# Patient Record
Sex: Female | Born: 1990
Health system: Southern US, Community
[De-identification: ages and names within clinical notes are randomized; demographics above are authoritative.]

## PROBLEM LIST (undated history)

## (undated) DIAGNOSIS — I719 Aortic aneurysm of unspecified site, without rupture: Secondary | ICD-10-CM

## (undated) DIAGNOSIS — G43909 Migraine, unspecified, not intractable, without status migrainosus: Secondary | ICD-10-CM

## (undated) DIAGNOSIS — R011 Cardiac murmur, unspecified: Secondary | ICD-10-CM

## (undated) DIAGNOSIS — I350 Nonrheumatic aortic (valve) stenosis: Secondary | ICD-10-CM

## (undated) DIAGNOSIS — L209 Atopic dermatitis, unspecified: Secondary | ICD-10-CM

## (undated) DIAGNOSIS — I519 Heart disease, unspecified: Secondary | ICD-10-CM

## (undated) DIAGNOSIS — T7840XA Allergy, unspecified, initial encounter: Secondary | ICD-10-CM

## (undated) HISTORY — DX: Nonrheumatic aortic (valve) stenosis: I35.0

## (undated) HISTORY — DX: Cardiac murmur, unspecified: R01.1

## (undated) HISTORY — DX: Heart disease, unspecified: I51.9

## (undated) HISTORY — DX: Aortic aneurysm of unspecified site, without rupture: I71.9

## (undated) HISTORY — PX: ROSS KONNO PROCEDURE: SHX2364

## (undated) HISTORY — DX: Allergy, unspecified, initial encounter: T78.40XA

## (undated) HISTORY — DX: Migraine, unspecified, not intractable, without status migrainosus: G43.909

## (undated) HISTORY — PX: WISDOM TOOTH EXTRACTION: SHX21

## (undated) HISTORY — DX: Atopic dermatitis, unspecified: L20.9

## (undated) HISTORY — PX: CARDIAC VALVE REPLACEMENT: SHX585

---
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
## 2015-12-29 DIAGNOSIS — K219 Gastro-esophageal reflux disease without esophagitis: Secondary | ICD-10-CM

## 2015-12-29 HISTORY — DX: Gastro-esophageal reflux disease without esophagitis: K21.9

## 2016-07-25 DIAGNOSIS — I83891 Varicose veins of right lower extremities with other complications: Secondary | ICD-10-CM | POA: Insufficient documentation

## 2016-08-25 DIAGNOSIS — Z954 Presence of other heart-valve replacement: Secondary | ICD-10-CM | POA: Insufficient documentation

## 2016-12-09 LAB — HM PAP SMEAR

## 2017-02-24 DIAGNOSIS — B001 Herpesviral vesicular dermatitis: Secondary | ICD-10-CM | POA: Diagnosis not present

## 2017-02-24 DIAGNOSIS — I359 Nonrheumatic aortic valve disorder, unspecified: Secondary | ICD-10-CM | POA: Diagnosis not present

## 2017-02-24 DIAGNOSIS — I340 Nonrheumatic mitral (valve) insufficiency: Secondary | ICD-10-CM | POA: Diagnosis not present

## 2017-02-24 DIAGNOSIS — G43109 Migraine with aura, not intractable, without status migrainosus: Secondary | ICD-10-CM | POA: Diagnosis not present

## 2017-03-28 DIAGNOSIS — Z954 Presence of other heart-valve replacement: Secondary | ICD-10-CM | POA: Diagnosis not present

## 2017-03-28 DIAGNOSIS — I77810 Thoracic aortic ectasia: Secondary | ICD-10-CM | POA: Diagnosis not present

## 2017-03-28 DIAGNOSIS — I83891 Varicose veins of right lower extremities with other complications: Secondary | ICD-10-CM | POA: Diagnosis not present

## 2017-03-28 DIAGNOSIS — R55 Syncope and collapse: Secondary | ICD-10-CM | POA: Diagnosis not present

## 2017-03-31 DIAGNOSIS — I340 Nonrheumatic mitral (valve) insufficiency: Secondary | ICD-10-CM | POA: Insufficient documentation

## 2017-03-31 DIAGNOSIS — I341 Nonrheumatic mitral (valve) prolapse: Secondary | ICD-10-CM | POA: Insufficient documentation

## 2017-07-11 DIAGNOSIS — N926 Irregular menstruation, unspecified: Secondary | ICD-10-CM | POA: Diagnosis not present

## 2017-07-11 DIAGNOSIS — N888 Other specified noninflammatory disorders of cervix uteri: Secondary | ICD-10-CM | POA: Diagnosis not present

## 2017-08-28 DIAGNOSIS — J309 Allergic rhinitis, unspecified: Secondary | ICD-10-CM | POA: Diagnosis not present

## 2017-08-28 DIAGNOSIS — B379 Candidiasis, unspecified: Secondary | ICD-10-CM | POA: Diagnosis not present

## 2017-08-28 DIAGNOSIS — Z6822 Body mass index (BMI) 22.0-22.9, adult: Secondary | ICD-10-CM | POA: Diagnosis not present

## 2017-08-28 DIAGNOSIS — M79631 Pain in right forearm: Secondary | ICD-10-CM | POA: Diagnosis not present

## 2017-09-08 DIAGNOSIS — R509 Fever, unspecified: Secondary | ICD-10-CM | POA: Diagnosis not present

## 2017-10-06 DIAGNOSIS — B002 Herpesviral gingivostomatitis and pharyngotonsillitis: Secondary | ICD-10-CM | POA: Diagnosis not present

## 2017-10-06 DIAGNOSIS — I340 Nonrheumatic mitral (valve) insufficiency: Secondary | ICD-10-CM | POA: Diagnosis not present

## 2017-10-06 DIAGNOSIS — Z23 Encounter for immunization: Secondary | ICD-10-CM | POA: Diagnosis not present

## 2017-10-06 DIAGNOSIS — G43109 Migraine with aura, not intractable, without status migrainosus: Secondary | ICD-10-CM | POA: Diagnosis not present

## 2017-10-06 DIAGNOSIS — Z0001 Encounter for general adult medical examination with abnormal findings: Secondary | ICD-10-CM | POA: Diagnosis not present

## 2017-11-07 DIAGNOSIS — Z954 Presence of other heart-valve replacement: Secondary | ICD-10-CM | POA: Diagnosis not present

## 2017-11-07 DIAGNOSIS — I371 Nonrheumatic pulmonary valve insufficiency: Secondary | ICD-10-CM | POA: Diagnosis not present

## 2017-11-07 DIAGNOSIS — Z8774 Personal history of (corrected) congenital malformations of heart and circulatory system: Secondary | ICD-10-CM | POA: Diagnosis not present

## 2017-11-07 DIAGNOSIS — I77810 Thoracic aortic ectasia: Secondary | ICD-10-CM | POA: Diagnosis not present

## 2017-11-07 DIAGNOSIS — Q230 Congenital stenosis of aortic valve: Secondary | ICD-10-CM | POA: Diagnosis not present

## 2017-11-07 DIAGNOSIS — I083 Combined rheumatic disorders of mitral, aortic and tricuspid valves: Secondary | ICD-10-CM | POA: Diagnosis not present

## 2017-11-07 DIAGNOSIS — R55 Syncope and collapse: Secondary | ICD-10-CM | POA: Diagnosis not present

## 2017-11-07 DIAGNOSIS — I83891 Varicose veins of right lower extremities with other complications: Secondary | ICD-10-CM | POA: Diagnosis not present

## 2018-02-10 DIAGNOSIS — I77810 Thoracic aortic ectasia: Secondary | ICD-10-CM | POA: Diagnosis not present

## 2018-07-07 ENCOUNTER — Encounter: Payer: Self-pay | Admitting: Family Medicine

## 2018-07-07 ENCOUNTER — Other Ambulatory Visit: Payer: Self-pay

## 2018-07-07 ENCOUNTER — Ambulatory Visit: Payer: BLUE CROSS/BLUE SHIELD | Admitting: Family Medicine

## 2018-07-07 VITALS — BP 110/70 | HR 65 | Temp 98.9°F | Ht 61.0 in | Wt 115.2 lb

## 2018-07-07 DIAGNOSIS — I77810 Thoracic aortic ectasia: Secondary | ICD-10-CM

## 2018-07-07 DIAGNOSIS — I83891 Varicose veins of right lower extremities with other complications: Secondary | ICD-10-CM | POA: Diagnosis not present

## 2018-07-07 DIAGNOSIS — I872 Venous insufficiency (chronic) (peripheral): Secondary | ICD-10-CM | POA: Diagnosis not present

## 2018-07-07 DIAGNOSIS — Q230 Congenital stenosis of aortic valve: Secondary | ICD-10-CM | POA: Diagnosis not present

## 2018-07-07 NOTE — Progress Notes (Signed)
Subjective  CC:  Chief Complaint  Patient presents with  . Establish Care    [facility] Dr. Olivia CanterWilliam Kelly, Last Physical October 2018  . Skin Problem    Patient states she has a place on her upper leg, she states that she thinks its a Varicose Vein     HPI: Cathy RoverSarah Vasquez is a 27 y.o. female who presents to [facility] today to establish care with me as a new patient.  Former pt of Dr. Olivia CanterWilliam Kelly [facility]. His notes are not available. Did review [facility] and OB notes/records.  She has the following concerns or needs:   Very pleasant 27 yo G2P2002 with h/o congenital aortic stenosis and now aortic root dilitation, s/p several procedures that are well documented in her chart. Overall, doing well in that regard. Taking 1/2 dose metoprolol preventively.   Has chronic varicosities on right leg, worse after pregnancy; reports h/o problems with right leg after a procedure done as a new born. She admits to intermitten leg swelling on right responsive to elevation and compression hose. Worse with menses. Does get painful varicosities at time. No h/o DVT.   HM: up to date; last cpe oct 2018.  Assessment  1. Aortic root dilatation (HCC) Chronic  2. Venous insufficiency (chronic) (peripheral)   3. Varicose veins of right leg with edema   4. Congenital aortic stenosis      Plan   Cards: stable and managed by cards.   Counseled/educated on varicosities and chronic venous insufficiency. rec vein clinic reassessment due to pain and progression. rec hydration, elevation and compression stockings as needed. Reassured.   Follow up:  Return in about 3 months (around 10/07/2018) for complete physical. Orders Placed This Encounter  Procedures  . HM PAP SMEAR  . Ambulatory referral to Vascular Surgery   No orders of the defined types were placed in this encounter.    Depression screen PHQ 2/9 07/07/2018    Decreased Interest 0  Down, Depressed, Hopeless 0  PHQ - 2 Score 0    We updated and reviewed the patient's past history in detail and it is documented below.  Patient Active Problem List   Diagnosis Date Noted  . Venous insufficiency (chronic) (peripheral) 07/07/2018  . Congenital aortic stenosis 11/07/2017  . Non-rheumatic mitral regurgitation 03/31/2017  . Aortic root dilatation (HCC) 03/28/2017  . H/O Ross procedure 08/25/2016  . Varicose veins of right leg with edema 07/25/2016    Cardiology's note venous insufficiency not cardiac related   . Gastroesophageal reflux disease without esophagitis 12/29/2015    - discussed lifestyle changes - prescribed ranitidine 12/29/15 Rx Zantac 150 mg # 60 BID with refills x 5 given(01/19/16)    Health Maintenance  Topic Date Due  . INFLUENZA VACCINE  07/09/2018  . PAP SMEAR  12/10/2019  . TETANUS/TDAP  05/10/2026  . HIV Screening  Completed   Immunization History  Administered Date(s) Administered  . Influenza-Unspecified 11/20/2012, 09/08/2015  . Tdap 05/10/2016   Current Meds  Medication Sig  . metoprolol succinate (TOPROL-XL) 25 MG 24 hr tablet Take 12.5 mg by mouth daily.  Marland Kitchen. triamcinolone ointment (KENALOG) 0.1 % 1 APP ONCE A DAY AT BEDTIME APPLIED TOPICALLY    Allergies: Patient is allergic to other; amoxicillin; cefaclor; and penicillin g. Past Medical History Patient  has a past medical history of Aortic aneurysm [facility](HCC), Aortic stenosis, Heart disease, Heart murmur, and Migraines. Past Surgical History  Patient  has a past surgical history that includes Cardiac surgery (05/17/2003); Wisdom tooth extraction; and Bridgette Habermann procedure. Family History: Patient family history includes Diabetes in her paternal grandfather; Healthy in her daughter, sister, sister, and son; Heart disease in her maternal grandmother; Hypertension in her father and mother; Migraines in her maternal aunt. Social History:  Patient  reports that she  has never smoked. She has never used smokeless tobacco. She reports that she drank alcohol. She reports that she has current or past drug history.  Review of Systems: Constitutional: negative for fever or malaise Ophthalmic: negative for photophobia, double vision or loss of vision Cardiovascular: negative for chest pain, dyspnea on exertion, or new LE swelling Respiratory: negative for SOB or persistent cough Gastrointestinal: negative for abdominal pain, change in bowel habits or melena Genitourinary: negative for dysuria or gross hematuria Musculoskeletal: negative for new gait disturbance or muscular weakness Integumentary: negative for new or persistent rashes Neurological: negative for TIA or stroke symptoms Psychiatric: negative for SI or delusions Allergic/Immunologic: negative for hives  Patient Care Team    Relationship Specialty Notifications Start End  Willow Ora, MD PCP - General Family Medicine  07/07/18     Objective  Vitals: BP 110/70   Pulse 65   Temp 98.9 F (37.2 C)   Ht 5\' 1"  (1.549 m)   Wt 115 lb 3.2 oz (52.3 kg)   SpO2 99%   BMI 21.77 kg/m  General:  Well developed, well nourished, no acute distress  Psych:  Alert and oriented,normal mood and affect HEENT:  Normocephalic, atraumatic, non-icteric sclera, PERRL, oropharynx is without mass or exudate, supple neck without adenopathy, mass or thyromegaly Cardiovascular:  RRR without gallop, rub + systolic murmur, nondisplaced PMI Respiratory:  Good breath sounds bilaterally, CTAB with normal respiratory effort MSK: no deformities, contusions. Joints are without erythema or swelling Right le: multiple large nontender varicosities in lower and upper leg/perineum. No warmth or thrombosis or erythema. No peripheral edema now. Skin:  Warm, no rashes or suspicious lesions noted Neurologic:    Mental status is normal. Gross motor and sensory exams are normal. Normal gait   Commons side effects, risks, benefits,  and alternatives for medications and treatment plan prescribed today were discussed, and the patient expressed understanding of the given instructions. Patient is instructed to call or message via MyChart if he/she has any questions or concerns regarding our treatment plan. No barriers to understanding were identified. We discussed Red Flag symptoms and signs in detail. Patient expressed understanding regarding what to do in case of urgent or emergency type symptoms.   Medication list was reconciled, printed and provided to the patient in AVS. Patient instructions and summary information was reviewed with the patient as documented in the AVS. This note was prepared with assistance of Dragon voice recognition software. Occasional wrong-word or sound-a-like substitutions may have occurred due to the inherent limitations of voice recognition software

## 2018-07-07 NOTE — Patient Instructions (Signed)
Please return in October for your annual complete physical; please come fasting.   If you have any questions or concerns, please don't hesitate to send me a message via MyChart or call the office at [phone]. Thank you for visiting with Cathy Vasquez today! It's our pleasure caring for you.  We will call you with information regarding your referral appointment. [facility].  If you do not hear from Cathy Vasquez within the next 2 weeks, please let me know. It can take 1-2 weeks to get appointments set up with the specialists.

## 2018-08-03 DIAGNOSIS — I83891 Varicose veins of right lower extremities with other complications: Secondary | ICD-10-CM | POA: Diagnosis not present

## 2018-08-03 DIAGNOSIS — M79604 Pain in right leg: Secondary | ICD-10-CM | POA: Diagnosis not present

## 2018-08-05 DIAGNOSIS — I83811 Varicose veins of right lower extremities with pain: Secondary | ICD-10-CM | POA: Diagnosis not present

## 2018-08-05 DIAGNOSIS — I8311 Varicose veins of right lower extremity with inflammation: Secondary | ICD-10-CM | POA: Diagnosis not present

## 2018-10-15 ENCOUNTER — Telehealth: Payer: Self-pay | Admitting: Family Medicine

## 2018-10-15 ENCOUNTER — Ambulatory Visit (INDEPENDENT_AMBULATORY_CARE_PROVIDER_SITE_OTHER): Payer: BLUE CROSS/BLUE SHIELD | Admitting: Family Medicine

## 2018-10-15 ENCOUNTER — Other Ambulatory Visit: Payer: Self-pay

## 2018-10-15 ENCOUNTER — Encounter: Payer: Self-pay | Admitting: Family Medicine

## 2018-10-15 VITALS — BP 92/64 | HR 65 | Temp 98.0°F | Ht 61.0 in | Wt 112.0 lb

## 2018-10-15 DIAGNOSIS — Z23 Encounter for immunization: Secondary | ICD-10-CM

## 2018-10-15 DIAGNOSIS — L301 Dyshidrosis [pompholyx]: Secondary | ICD-10-CM | POA: Insufficient documentation

## 2018-10-15 DIAGNOSIS — I340 Nonrheumatic mitral (valve) insufficiency: Secondary | ICD-10-CM

## 2018-10-15 DIAGNOSIS — I83891 Varicose veins of right lower extremities with other complications: Secondary | ICD-10-CM

## 2018-10-15 DIAGNOSIS — Z0001 Encounter for general adult medical examination with abnormal findings: Secondary | ICD-10-CM

## 2018-10-15 DIAGNOSIS — I872 Venous insufficiency (chronic) (peripheral): Secondary | ICD-10-CM

## 2018-10-15 DIAGNOSIS — L209 Atopic dermatitis, unspecified: Secondary | ICD-10-CM | POA: Diagnosis not present

## 2018-10-15 HISTORY — DX: Atopic dermatitis, unspecified: L20.9

## 2018-10-15 LAB — CBC WITH DIFFERENTIAL/PLATELET
BASOS ABS: 0 10*3/uL (ref 0.0–0.1)
BASOS PCT: 0.7 % (ref 0.0–3.0)
Eosinophils Absolute: 0.2 10*3/uL (ref 0.0–0.7)
Eosinophils Relative: 2.5 % (ref 0.0–5.0)
HEMATOCRIT: 43.5 % (ref 36.0–46.0)
Hemoglobin: 14.8 g/dL (ref 12.0–15.0)
LYMPHS ABS: 2 10*3/uL (ref 0.7–4.0)
LYMPHS PCT: 32.1 % (ref 12.0–46.0)
MCHC: 34.1 g/dL (ref 30.0–36.0)
MCV: 93.5 fl (ref 78.0–100.0)
MONO ABS: 0.5 10*3/uL (ref 0.1–1.0)
Monocytes Relative: 7.7 % (ref 3.0–12.0)
NEUTROS ABS: 3.5 10*3/uL (ref 1.4–7.7)
NEUTROS PCT: 57 % (ref 43.0–77.0)
PLATELETS: 212 10*3/uL (ref 150.0–400.0)
RBC: 4.65 Mil/uL (ref 3.87–5.11)
RDW: 13.1 % (ref 11.5–15.5)
WBC: 6.1 10*3/uL (ref 4.0–10.5)

## 2018-10-15 LAB — LIPID PANEL
CHOL/HDL RATIO: 4
Cholesterol: 182 mg/dL (ref 0–200)
HDL: 49.6 mg/dL (ref >39.00)
LDL CALC: 118 mg/dL — AB (ref 0–99)
NONHDL: 132.23
TRIGLYCERIDES: 72 mg/dL (ref 0.0–149.0)
VLDL: 14.4 mg/dL (ref 0.0–40.0)

## 2018-10-15 LAB — COMPREHENSIVE METABOLIC PANEL
ALT: 12 U/L (ref 0–35)
AST: 16 U/L (ref 0–37)
Albumin: 4.7 g/dL (ref 3.5–5.2)
Alkaline Phosphatase: 26 U/L — ABNORMAL LOW (ref 39–117)
BUN: 12 mg/dL (ref 6–23)
CALCIUM: 9.6 mg/dL (ref 8.4–10.5)
CHLORIDE: 102 meq/L (ref 96–112)
CO2: 30 meq/L (ref 19–32)
Creatinine, Ser: 0.72 mg/dL (ref 0.40–1.20)
GFR: 102.82 mL/min (ref >60.00)
GLUCOSE: 78 mg/dL (ref 70–99)
Potassium: 4 mEq/L (ref 3.5–5.1)
SODIUM: 137 meq/L (ref 135–145)
Total Bilirubin: 0.5 mg/dL (ref 0.2–1.2)
Total Protein: 7.4 g/dL (ref 6.0–8.3)

## 2018-10-15 LAB — TSH: TSH: 6.82 u[IU]/mL — ABNORMAL HIGH (ref 0.35–4.50)

## 2018-10-15 MED ORDER — CLINDAMYCIN HCL 300 MG PO CAPS: ORAL_CAPSULE | ORAL | 3 refills | Status: DC

## 2018-10-15 NOTE — Telephone Encounter (Signed)
Copied from CRM [id number]. Topic: General - Other >> Oct 15, 2018 11:55 AM Cathy Vasquez wrote: Reason for CRM: Patient called to say that she have Vasquez dental appointment next Tuesday 10/20/18 at 3 pm and need Clindamycin Hcl because of her Heart Valve replacement. Please advise Ph # [phone]

## 2018-10-15 NOTE — Progress Notes (Signed)
Please add on free T4, and T3, dx: abnl TSH Thanks, Dr. Mardelle Matte '

## 2018-10-15 NOTE — Progress Notes (Signed)
Subjective  Chief Complaint  Patient presents with  . Annual Exam    doing well, no complaints, requests flu shot today     HPI: Cathy Vasquez is a 27 y.o. female who presents to [facility] at [facility] today for a Female Wellness Visit.   Wellness Visit: annual visit with health maintenance review and exam without Pap   Doing well. Vein clinic- recommend surgical repair of leaky valve in right groin.   Heart is stable: has f/u this month with cards  Dry skin: eczema, scratches, dry patches.   Assessment  1. Encounter for general adult medical examination with abnormal findings   2. Atopic dermatitis, unspecified type   3. Dyshidrotic eczema   4. Need for immunization against influenza   5. Venous insufficiency (chronic) (peripheral)   6. Varicose veins of right leg with edema   7. Non-rheumatic mitral regurgitation      Plan  Female Wellness Visit:  Age appropriate Health Maintenance and Prevention measures were discussed with patient. Included topics are cancer screening recommendations, ways to keep healthy (see AVS) including dietary and exercise recommendations, regular eye and dental care, use of seat belts, and avoidance of moderate alcohol use and tobacco use.   BMI: discussed patient's BMI and encouraged positive lifestyle modifications to help get to or maintain a target BMI.  HM needs and immunizations were addressed and ordered. See below for orders. See HM and immunization section for updates. Flu shot today  Routine labs and screening tests ordered including cmp, cbc and lipids where appropriate.  Discussed recommendations regarding Vit D and calcium supplementation (see AVS)  Discussed eczema skin care   Follow up: Return in 1 year (on 10/16/2019) for cpe, come fasting.   Orders Placed This Encounter  Procedures  . Flu Vaccine QUAD 36+ mos IM (Fluarix)  . Flu Vaccine QUAD 36+ mos IM  . CBC with Differential  . Comprehensive  metabolic panel  . Lipid panel  . TSH   No orders of the defined types were placed in this encounter.     Lifestyle: Body mass index is 21.16 kg/m. Wt Readings from Last 3 Encounters:  10/15/18 112 lb (50.8 kg)  07/07/18 115 lb 3.2 oz (52.3 kg)   Diet: low fat Exercise: frequently,  Need for contraception: No, vasectomy  Patient Active Problem List   Diagnosis Date Noted  . Atopic eczema 10/15/2018  . Dyshidrotic eczema 10/15/2018  . Venous insufficiency (chronic) (peripheral) 07/07/2018  . Congenital aortic stenosis 11/07/2017  . Non-rheumatic mitral regurgitation 03/31/2017  . Aortic root dilatation (HCC) 03/28/2017  . H/O Ross procedure 08/25/2016  . Varicose veins of right leg with edema 07/25/2016    Cardiology's note venous insufficiency not cardiac related   . Gastroesophageal reflux disease without esophagitis 12/29/2015    - discussed lifestyle changes - prescribed ranitidine 12/29/15 Rx Zantac 150 mg # 60 BID with refills x 5 given(01/19/16)    Health Maintenance  Topic Date Due  . INFLUENZA VACCINE  07/09/2018  . PAP SMEAR  12/10/2019  . TETANUS/TDAP  05/10/2026  . HIV Screening  Completed   Immunization History  Administered Date(s) Administered  . Influenza,inj,Quad PF,6+ Mos 10/15/2018  . Influenza-Unspecified 11/20/2012, 09/08/2015  . Tdap 05/10/2016   We updated and reviewed the patient's past history in detail and it is documented below. Allergies: Patient is allergic to other; amoxicillin; cefaclor; and penicillin g. Past Medical History Patient  has a past medical history of Aortic aneurysm [facility]), Aortic  stenosis, Atopic eczema (10/15/2018), Heart disease, Heart murmur, and Migraines. Past Surgical History Patient  has a past surgical history that includes Cardiac surgery (05/17/2003); Wisdom tooth extraction; and Bridgette Habermann procedure. Family History: Patient family history includes Diabetes in her paternal grandfather; Healthy in her  daughter, sister, sister, and son; Heart disease in her maternal grandmother; Hypertension in her father and mother; Migraines in her maternal aunt. Social History:  Patient  reports that she has never smoked. She has never used smokeless tobacco. She reports that she drank alcohol. She reports that she has current or past drug history.  Review of Systems: Constitutional: negative for fever or malaise Ophthalmic: negative for photophobia, double vision or loss of vision Cardiovascular: negative for chest pain, dyspnea on exertion, or new LE swelling Respiratory: negative for SOB or persistent cough Gastrointestinal: negative for abdominal pain, change in bowel habits or melena Genitourinary: negative for dysuria or gross hematuria, no abnormal uterine bleeding or disharge Musculoskeletal: negative for new gait disturbance or muscular weakness Integumentary: negative for new or persistent rashes, no breast lumps Neurological: negative for TIA or stroke symptoms Psychiatric: negative for SI or delusions Allergic/Immunologic: negative for hives Patient Care Team    Relationship Specialty Notifications Start End  Willow Ora, MD PCP - General Family Medicine  07/07/18     Objective  Vitals: BP 92/64   Pulse 65   Temp 98 F (36.7 C)   Ht 5\' 1"  (1.549 m)   Wt 112 lb (50.8 kg)   LMP 09/23/2018   SpO2 100%   BMI 21.16 kg/m  General:  Well developed, well nourished, no acute distress  Psych:  Alert and orientedx3,normal mood and affect HEENT:  Normocephalic, atraumatic, non-icteric sclera, PERRL, oropharynx is clear without mass or exudate, supple neck without adenopathy, mass or thyromegaly Cardiovascular:  Normal S1, S2, RRR without gallop, + systolic murmur at base,  Chest wall changes present Respiratory:  Good breath sounds bilaterally, CTAB with normal respiratory effort Gastrointestinal: normal bowel sounds, soft, non-tender, no noted masses. No HSM MSK: no deformities,  contusions. Joints are without erythema or swelling. Spine and CVA region are nontender Skin:  Warm, no rashes or suspicious lesions noted Neurologic:    Mental status is normal. CN 2-11 are normal. Gross motor and sensory exams are normal. Normal gait. No tremor Breast Exam: No mass, skin retraction or nipple discharge is appreciated in either breast. No axillary adenopathy. Fibrocystic changes are not noted    Commons side effects, risks, benefits, and alternatives for medications and treatment plan prescribed today were discussed, and the patient expressed understanding of the given instructions. Patient is instructed to call or message via MyChart if he/she has any questions or concerns regarding our treatment plan. No barriers to understanding were identified. We discussed Red Flag symptoms and signs in detail. Patient expressed understanding regarding what to do in case of urgent or emergency type symptoms.   Medication list was reconciled, printed and provided to the patient in AVS. Patient instructions and summary information was reviewed with the patient as documented in the AVS. This note was prepared with assistance of Dragon voice recognition software. Occasional wrong-word or sound-a-like substitutions may have occurred due to the inherent limitations of voice recognition software

## 2018-10-15 NOTE — Patient Instructions (Addendum)
Please return in 12 months for your annual complete physical; please come fasting.  Use the steroid ointment on your dry red patches as needed and don't scratch. vaseline is helpful as well.   If you have any questions or concerns, please don't hesitate to send me a message via MyChart or call the office at [phone]. Thank you for visiting with [place] today! It's our pleasure caring for you.   Health Maintenance, Female Adopting a healthy lifestyle and getting preventive care can go a long way to promote health and wellness. Talk with your health care provider about what schedule of regular examinations is right for you. This is a good chance for you to check in with your provider about disease prevention and staying healthy. In between checkups, there are plenty of things you can do on your own. Experts have done a lot of research about which lifestyle changes and preventive measures are most likely to keep you healthy. Ask your health care provider for more information. Weight and diet Eat a healthy diet  Be sure to include plenty of vegetables, fruits, low-fat dairy products, and lean protein.  Do not eat a lot of foods high in solid fats, added sugars, or salt.  Get regular exercise. This is one of the most important things you can do for your health. ? Most adults should exercise for at least 150 minutes each week. The exercise should increase your heart rate and make you sweat (moderate-intensity exercise). ? Most adults should also do strengthening exercises at least twice a week. This is in addition to the moderate-intensity exercise.  Maintain a healthy weight  Body mass index (BMI) is a measurement that can be used to identify possible weight problems. It estimates body fat based on height and weight. Your health care provider can help determine your BMI and help you achieve or maintain a healthy weight.  For females 48 years of age and older: ? A BMI below 18.5 is considered  underweight. ? A BMI of 18.5 to 24.9 is normal. ? A BMI of 25 to 29.9 is considered overweight. ? A BMI of 30 and above is considered obese.  Watch levels of cholesterol and blood lipids  You should start having your blood tested for lipids and cholesterol at 27 years of age, then have this test every 5 years.  You may need to have your cholesterol levels checked more often if: ? Your lipid or cholesterol levels are high. ? You are older than 27 years of age. ? You are at high risk for heart disease.  Cancer screening Lung Cancer  Lung cancer screening is recommended for adults 30-72 years old who are at high risk for lung cancer because of a history of smoking.  A yearly low-dose CT scan of the lungs is recommended for people who: ? Currently smoke. ? Have quit within the past 15 years. ? Have at least a 30-pack-year history of smoking. A pack year is smoking an average of one pack of cigarettes a day for 1 year.  Yearly screening should continue until it has been 15 years since you quit.  Yearly screening should stop if you develop a health problem that would prevent you from having lung cancer treatment.  Breast Cancer  Practice breast self-awareness. This means understanding how your breasts normally appear and feel.  It also means doing regular breast self-exams. Let your health care provider know about any changes, no matter how small.  If you are in your  80s or 73s, you should have a clinical breast exam (CBE) by a health care provider every 1-3 years as part of a regular health exam.  If you are 4 or older, have a CBE every year. Also consider having a breast X-ray (mammogram) every year.  If you have a family history of breast cancer, talk to your health care provider about genetic screening.  If you are at high risk for breast cancer, talk to your health care provider about having an MRI and a mammogram every year.  Breast cancer gene (BRCA) assessment is  recommended for women who have family members with BRCA-related cancers. BRCA-related cancers include: ? Breast. ? Ovarian. ? Tubal. ? Peritoneal cancers.  Results of the assessment will determine the need for genetic counseling and BRCA1 and BRCA2 testing.  Cervical Cancer Your health care provider may recommend that you be screened regularly for cancer of the pelvic organs (ovaries, uterus, and vagina). This screening involves a pelvic examination, including checking for microscopic changes to the surface of your cervix (Pap test). You may be encouraged to have this screening done every 3 years, beginning at age 106.  For women ages 68-65, health care providers may recommend pelvic exams and Pap testing every 3 years, or they may recommend the Pap and pelvic exam, combined with testing for human papilloma virus (HPV), every 5 years. Some types of HPV increase your risk of cervical cancer. Testing for HPV may also be done on women of any age with unclear Pap test results.  Other health care providers may not recommend any screening for nonpregnant women who are considered low risk for pelvic cancer and who do not have symptoms. Ask your health care provider if a screening pelvic exam is right for you.  If you have had past treatment for cervical cancer or a condition that could lead to cancer, you need Pap tests and screening for cancer for at least 20 years after your treatment. If Pap tests have been discontinued, your risk factors (such as having a new sexual partner) need to be reassessed to determine if screening should resume. Some women have medical problems that increase the chance of getting cervical cancer. In these cases, your health care provider may recommend more frequent screening and Pap tests.  Colorectal Cancer  This type of cancer can be detected and often prevented.  Routine colorectal cancer screening usually begins at 27 years of age and continues through 27 years of  age.  Your health care provider may recommend screening at an earlier age if you have risk factors for colon cancer.  Your health care provider may also recommend using home test kits to check for hidden blood in the stool.  A small camera at the end of a tube can be used to examine your colon directly (sigmoidoscopy or colonoscopy). This is done to check for the earliest forms of colorectal cancer.  Routine screening usually begins at age 33.  Direct examination of the colon should be repeated every 5-10 years through 27 years of age. However, you may need to be screened more often if early forms of precancerous polyps or small growths are found.  Skin Cancer  Check your skin from head to toe regularly.  Tell your health care provider about any new moles or changes in moles, especially if there is a change in a mole's shape or color.  Also tell your health care provider if you have a mole that is larger than the size of  a pencil eraser.  Always use sunscreen. Apply sunscreen liberally and repeatedly throughout the day.  Protect yourself by wearing long sleeves, pants, a wide-brimmed hat, and sunglasses whenever you are outside.  Heart disease, diabetes, and high blood pressure  High blood pressure causes heart disease and increases the risk of stroke. High blood pressure is more likely to develop in: ? People who have blood pressure in the high end of the normal range (130-139/85-89 mm Hg). ? People who are overweight or obese. ? People who are African American.  If you are 45-2 years of age, have your blood pressure checked every 3-5 years. If you are 68 years of age or older, have your blood pressure checked every year. You should have your blood pressure measured twice-once when you are at a hospital or clinic, and once when you are not at a hospital or clinic. Record the average of the two measurements. To check your blood pressure when you are not at a hospital or clinic, you  can use: ? An automated blood pressure machine at a pharmacy. ? A home blood pressure monitor.  If you are between 65 years and 22 years old, ask your health care provider if you should take aspirin to prevent strokes.  Have regular diabetes screenings. This involves taking a blood sample to check your fasting blood sugar level. ? If you are at a normal weight and have a low risk for diabetes, have this test once every three years after 27 years of age. ? If you are overweight and have a high risk for diabetes, consider being tested at a younger age or more often. Preventing infection Hepatitis B  If you have a higher risk for hepatitis B, you should be screened for this virus. You are considered at high risk for hepatitis B if: ? You were born in a country where hepatitis B is common. Ask your health care provider which countries are considered high risk. ? Your parents were born in a high-risk country, and you have not been immunized against hepatitis B (hepatitis B vaccine). ? You have HIV or AIDS. ? You use needles to inject street drugs. ? You live with someone who has hepatitis B. ? You have had sex with someone who has hepatitis B. ? You get hemodialysis treatment. ? You take certain medicines for conditions, including cancer, organ transplantation, and autoimmune conditions.  Hepatitis C  Blood testing is recommended for: ? Everyone born from [birth year] through 1965. ? Anyone with known risk factors for hepatitis C.  Sexually transmitted infections (STIs)  You should be screened for sexually transmitted infections (STIs) including gonorrhea and chlamydia if: ? You are sexually active and are younger than 27 years of age. ? You are older than 27 years of age and your health care provider tells you that you are at risk for this type of infection. ? Your sexual activity has changed since you were last screened and you are at an increased risk for chlamydia or gonorrhea. Ask your  health care provider if you are at risk.  If you do not have HIV, but are at risk, it may be recommended that you take a prescription medicine daily to prevent HIV infection. This is called pre-exposure prophylaxis (PrEP). You are considered at risk if: ? You are sexually active and do not regularly use condoms or know the HIV status of your partner(s). ? You take drugs by injection. ? You are sexually active with a partner who has  HIV.  Talk with your health care provider about whether you are at high risk of being infected with HIV. If you choose to begin PrEP, you should first be tested for HIV. You should then be tested every 3 months for as long as you are taking PrEP. Pregnancy  If you are premenopausal and you may become pregnant, ask your health care provider about preconception counseling.  If you may become pregnant, take 400 to 800 micrograms (mcg) of folic acid every day.  If you want to prevent pregnancy, talk to your health care provider about birth control (contraception). Osteoporosis and menopause  Osteoporosis is a disease in which the bones lose minerals and strength with aging. This can result in serious bone fractures. Your risk for osteoporosis can be identified using a bone density scan.  If you are 19 years of age or older, or if you are at risk for osteoporosis and fractures, ask your health care provider if you should be screened.  Ask your health care provider whether you should take a calcium or vitamin D supplement to lower your risk for osteoporosis.  Menopause may have certain physical symptoms and risks.  Hormone replacement therapy may reduce some of these symptoms and risks. Talk to your health care provider about whether hormone replacement therapy is right for you. Follow these instructions at home:  Schedule regular health, dental, and eye exams.  Stay current with your immunizations.  Do not use any tobacco products including cigarettes, chewing  tobacco, or electronic cigarettes.  If you are pregnant, do not drink alcohol.  If you are breastfeeding, limit how much and how often you drink alcohol.  Limit alcohol intake to no more than 1 drink per day for nonpregnant women. One drink equals 12 ounces of beer, 5 ounces of wine, or 1 ounces of hard liquor.  Do not use street drugs.  Do not share needles.  Ask your health care provider for help if you need support or information about quitting drugs.  Tell your health care provider if you often feel depressed.  Tell your health care provider if you have ever been abused or do not feel safe at home. This information is not intended to replace advice given to you by your health care provider. Make sure you discuss any questions you have with your health care provider. Document Released: 06/10/2011 Document Revised: 05/02/2016 Document Reviewed: 08/29/2015 Elsevier Interactive Patient Education  [employer].

## 2018-10-15 NOTE — Telephone Encounter (Signed)
Ok to [place] Clindamycin for her dental appointment. If so what Dose?  Please advise.   Kathi Simpers,  LPN

## 2018-10-16 ENCOUNTER — Other Ambulatory Visit (INDEPENDENT_AMBULATORY_CARE_PROVIDER_SITE_OTHER): Payer: BLUE CROSS/BLUE SHIELD

## 2018-10-16 DIAGNOSIS — R7989 Other specified abnormal findings of blood chemistry: Secondary | ICD-10-CM

## 2018-10-16 LAB — T4, FREE: Free T4: 0.86 ng/dL (ref 0.60–1.60)

## 2018-10-16 LAB — T3, FREE: T3, Free: 3.5 pg/mL (ref 2.3–4.2)

## 2018-10-29 ENCOUNTER — Other Ambulatory Visit: Payer: Self-pay

## 2018-10-29 ENCOUNTER — Ambulatory Visit: Payer: BLUE CROSS/BLUE SHIELD | Admitting: Family Medicine

## 2018-10-29 ENCOUNTER — Encounter: Payer: Self-pay | Admitting: Family Medicine

## 2018-10-29 VITALS — BP 100/70 | HR 66 | Temp 98.6°F | Resp 14 | Ht 61.0 in | Wt 114.0 lb

## 2018-10-29 DIAGNOSIS — J208 Acute bronchitis due to other specified organisms: Secondary | ICD-10-CM

## 2018-10-29 MED ORDER — BENZONATATE 100 MG PO CAPS: 100.0000 mg | ORAL_CAPSULE | Freq: Two times a day (BID) | ORAL | 0 refills | Status: DC | PRN

## 2018-10-29 NOTE — Progress Notes (Signed)
Subjective  CC:  Chief Complaint  Patient presents with  . URI    x 5 days ago cough, hoarseness, headache, fever 99.15F. Tylenol    HPI: SUBJECTIVE:  Cathy Vasquez is a 27 y.o. female who complains of congestion, nasal blockage, post nasal drip, cough described as dry and denies sinus, high fevers, SOB, chest pain or significant GI symptoms. Symptoms have been present for 4-5 days and is improving. She denies a history of anorexia, dizziness, vomiting and wheezing. She denies a history of asthma or COPD. Patient does not smoke cigarettes. Her daughter now has same illness.   Assessment  1. Viral bronchitis      Plan  Discussion:  Clinically c/w viral  bronchitis . Improving. Supportive care. Tessalon perles ordered. Education regarding differences between viral and bacterial infections and treatment options are discussed.  Supportive care measures are recommended.  We discussed the use of mucolytic's, decongestants, antihistamines and antitussives as needed.  Tylenol or Advil are recommended if needed.  Follow up: prn   No orders of the defined types were placed in this encounter.  Meds ordered this encounter  Medications  . benzonatate (TESSALON) 100 MG capsule    Sig: Take 1 capsule (100 mg total) by mouth 2 (two) times daily as needed for cough.    Dispense:  20 capsule    Refill:  0      I reviewed the patients updated PMH, FH, and SocHx.  Social History: Patient  reports that she has never smoked. She has never used smokeless tobacco. She reports that she drank alcohol. She reports that she has current or past drug history.  Patient Active Problem List   Diagnosis Date Noted  . Atopic eczema 10/15/2018  . Dyshidrotic eczema 10/15/2018  . Venous insufficiency (chronic) (peripheral) 07/07/2018  . Congenital aortic stenosis 11/07/2017  . Non-rheumatic mitral regurgitation 03/31/2017  . Aortic root dilatation (HCC) 03/28/2017  . H/O Ross procedure 08/25/2016  .  Varicose veins of right leg with edema 07/25/2016  . Gastroesophageal reflux disease without esophagitis 12/29/2015    Review of Systems: Cardiovascular: negative for chest pain Respiratory: negative for SOB or hemoptysis Gastrointestinal: negative for abdominal pain Genitourinary: negative for dysuria or gross hematuria Current Meds  Medication Sig  . clindamycin (CLEOCIN) 300 MG capsule Take 2 capsules 30-60 minutes before your appointment  . metoprolol succinate (TOPROL-XL) 25 MG 24 hr tablet Take 12.5 mg by mouth daily.  Marland Kitchen triamcinolone ointment (KENALOG) 0.1 % 1 APP ONCE A DAY AT BEDTIME APPLIED TOPICALLY    Objective  Vitals: BP 100/70   Pulse 66   Temp 98.6 F (37 C) (Oral)   Resp 14   Ht 5\' 1"  (1.549 m)   Wt 114 lb (51.7 kg)   SpO2 99%   BMI 21.54 kg/m  General: no acute distress  Psych:  Alert and oriented, normal mood and affect HEENT:  Normocephalic, atraumatic, supple neck, moist mucous membranes, mildly erythematous pharynx without exudate, mild lymphadenopathy, supple neck Cardiovascular:  RRR with murmur. no edema Respiratory:  Good breath sounds bilaterally, CTAB with normal respiratory effort without wheeze or rales or rhonchi Skin:  Warm, no rashes Neurologic:   Mental status is normal. normal gait  Commons side effects, risks, benefits, and alternatives for medications and treatment plan prescribed today were discussed, and the patient expressed understanding of the given instructions. Patient is instructed to call or message via MyChart if he/she has any questions or concerns regarding our treatment plan. No  barriers to understanding were identified. We discussed Red Flag symptoms and signs in detail. Patient expressed understanding regarding what to do in case of urgent or emergency type symptoms.  Medication list was reconciled, printed and provided to the patient in AVS. Patient instructions and summary information was reviewed with the patient as documented  in the AVS. This note was prepared with assistance of Dragon voice recognition software. Occasional wrong-word or sound-a-like substitutions may have occurred due to the inherent limitations of voice recognition software

## 2018-10-29 NOTE — Patient Instructions (Signed)
Please follow up if symptoms do not improve or as needed.   You may use Delsym cough syrup or Mucinex DM to help with congestion and coughing.   Acute Bronchitis, Adult Acute bronchitis is sudden (acute) swelling of the air tubes (bronchi) in the lungs. Acute bronchitis causes these tubes to fill with mucus, which can make it hard to breathe. It can also cause coughing or wheezing. In adults, acute bronchitis usually goes away within 2 weeks. A cough caused by bronchitis may last up to 3 weeks. Smoking, allergies, and asthma can make the condition worse. Repeated episodes of bronchitis may cause further lung problems, such as chronic obstructive pulmonary disease (COPD). What are the causes? This condition can be caused by germs and by substances that irritate the lungs, including:  Cold and flu viruses. This condition is most often caused by the same virus that causes a cold.  Bacteria.  Exposure to tobacco smoke, dust, fumes, and air pollution.  What increases the risk? This condition is more likely to develop in people who:  Have close contact with someone with acute bronchitis.  Are exposed to lung irritants, such as tobacco smoke, dust, fumes, and vapors.  Have a weak immune system.  Have a respiratory condition such as asthma.  What are the signs or symptoms? Symptoms of this condition include:  A cough.  Coughing up clear, yellow, or green mucus.  Wheezing.  Chest congestion.  Shortness of breath.  A fever.  Body aches.  Chills.  A sore throat.  How is this diagnosed? This condition is usually diagnosed with a physical exam. During the exam, your health care provider may order tests, such as chest X-rays, to rule out other conditions. He or she may also:  Test a sample of your mucus for bacterial infection.  Check the level of oxygen in your blood. This is done to check for pneumonia.  Do a chest X-ray or lung function testing to rule out pneumonia and  other conditions.  Perform blood tests.  Your health care provider will also ask about your symptoms and medical history. How is this treated? Most cases of acute bronchitis clear up over time without treatment. Your health care provider may recommend:  Drinking more fluids. Drinking more makes your mucus thinner, which may make it easier to breathe.  Taking a medicine for a fever or cough.  Taking an antibiotic medicine.  Using an inhaler to help improve shortness of breath and to control a cough.  Using a cool mist vaporizer or humidifier to make it easier to breathe.  Follow these instructions at home: Medicines  Take over-the-counter and prescription medicines only as told by your health care provider.  If you were prescribed an antibiotic, take it as told by your health care provider. Do not stop taking the antibiotic even if you start to feel better. General instructions  Get plenty of rest.  Drink enough fluids to keep your urine clear or pale yellow.  Avoid smoking and secondhand smoke. Exposure to cigarette smoke or irritating chemicals will make bronchitis worse. If you smoke and you need help quitting, ask your health care provider. Quitting smoking will help your lungs heal faster.  Use an inhaler, cool mist vaporizer, or humidifier as told by your health care provider.  Keep all follow-up visits as told by your health care provider. This is important. How is this prevented? To lower your risk of getting this condition again:  Wash your hands often with soap   and water. If soap and water are not available, use hand sanitizer.  Avoid contact with people who have cold symptoms.  Try not to touch your hands to your mouth, nose, or eyes.  Make sure to get the flu shot every year.  Contact a health care provider if:  Your symptoms do not improve in 2 weeks of treatment. Get help right away if:  You cough up blood.  You have chest pain.  You have severe  shortness of breath.  You become dehydrated.  You faint or keep feeling like you are going to faint.  You keep vomiting.  You have a severe headache.  Your fever or chills gets worse. This information is not intended to replace advice given to you by your health care provider. Make sure you discuss any questions you have with your health care provider. Document Released: 01/02/2005 Document Revised: 06/19/2016 Document Reviewed: 05/15/2016 Elsevier Interactive Patient Education  2018 [employer]   

## 2018-11-04 DIAGNOSIS — I8311 Varicose veins of right lower extremity with inflammation: Secondary | ICD-10-CM | POA: Diagnosis not present

## 2018-11-04 DIAGNOSIS — I83811 Varicose veins of right lower extremities with pain: Secondary | ICD-10-CM | POA: Diagnosis not present

## 2018-11-18 DIAGNOSIS — Q230 Congenital stenosis of aortic valve: Secondary | ICD-10-CM | POA: Diagnosis not present

## 2018-11-18 DIAGNOSIS — I77810 Thoracic aortic ectasia: Secondary | ICD-10-CM | POA: Diagnosis not present

## 2018-11-18 DIAGNOSIS — I712 Thoracic aortic aneurysm, without rupture: Secondary | ICD-10-CM | POA: Diagnosis not present

## 2018-11-18 DIAGNOSIS — I8391 Asymptomatic varicose veins of right lower extremity: Secondary | ICD-10-CM | POA: Diagnosis not present

## 2018-11-18 DIAGNOSIS — I77819 Aortic ectasia, unspecified site: Secondary | ICD-10-CM | POA: Diagnosis not present

## 2018-11-18 DIAGNOSIS — Z8679 Personal history of other diseases of the circulatory system: Secondary | ICD-10-CM | POA: Diagnosis not present

## 2018-11-18 DIAGNOSIS — Z79899 Other long term (current) drug therapy: Secondary | ICD-10-CM | POA: Diagnosis not present

## 2018-11-18 DIAGNOSIS — Z954 Presence of other heart-valve replacement: Secondary | ICD-10-CM | POA: Diagnosis not present

## 2018-12-09 HISTORY — PX: VENOUS ABLATION: SHX2656

## 2019-01-01 DIAGNOSIS — I83811 Varicose veins of right lower extremities with pain: Secondary | ICD-10-CM | POA: Diagnosis not present

## 2019-01-01 DIAGNOSIS — I8311 Varicose veins of right lower extremity with inflammation: Secondary | ICD-10-CM | POA: Diagnosis not present

## 2019-01-05 DIAGNOSIS — I83811 Varicose veins of right lower extremities with pain: Secondary | ICD-10-CM | POA: Diagnosis not present

## 2019-01-05 DIAGNOSIS — I8311 Varicose veins of right lower extremity with inflammation: Secondary | ICD-10-CM | POA: Diagnosis not present

## 2019-01-19 DIAGNOSIS — I8311 Varicose veins of right lower extremity with inflammation: Secondary | ICD-10-CM | POA: Diagnosis not present

## 2019-01-19 DIAGNOSIS — I83891 Varicose veins of right lower extremities with other complications: Secondary | ICD-10-CM | POA: Diagnosis not present

## 2019-02-01 DIAGNOSIS — I8311 Varicose veins of right lower extremity with inflammation: Secondary | ICD-10-CM | POA: Diagnosis not present

## 2019-02-01 DIAGNOSIS — M7981 Nontraumatic hematoma of soft tissue: Secondary | ICD-10-CM | POA: Diagnosis not present

## 2019-02-12 DIAGNOSIS — I8311 Varicose veins of right lower extremity with inflammation: Secondary | ICD-10-CM | POA: Diagnosis not present

## 2019-02-12 DIAGNOSIS — M7981 Nontraumatic hematoma of soft tissue: Secondary | ICD-10-CM | POA: Diagnosis not present

## 2019-02-12 DIAGNOSIS — I83891 Varicose veins of right lower extremities with other complications: Secondary | ICD-10-CM | POA: Diagnosis not present

## 2019-04-01 ENCOUNTER — Encounter: Payer: Self-pay | Admitting: Family Medicine

## 2019-04-01 ENCOUNTER — Telehealth: Payer: Self-pay | Admitting: Family Medicine

## 2019-04-01 ENCOUNTER — Other Ambulatory Visit: Payer: Self-pay

## 2019-04-01 ENCOUNTER — Ambulatory Visit (INDEPENDENT_AMBULATORY_CARE_PROVIDER_SITE_OTHER): Payer: BLUE CROSS/BLUE SHIELD | Admitting: Family Medicine

## 2019-04-01 VITALS — Wt 115.0 lb

## 2019-04-01 DIAGNOSIS — E039 Hypothyroidism, unspecified: Secondary | ICD-10-CM

## 2019-04-01 DIAGNOSIS — E038 Other specified hypothyroidism: Secondary | ICD-10-CM

## 2019-04-01 DIAGNOSIS — N926 Irregular menstruation, unspecified: Secondary | ICD-10-CM

## 2019-04-01 DIAGNOSIS — R5383 Other fatigue: Secondary | ICD-10-CM | POA: Diagnosis not present

## 2019-04-01 NOTE — Telephone Encounter (Signed)
Copied from CRM [id number]. Topic: General - Inquiry >> Apr 01, 2019  3:01 PM Maia Petties wrote: Reason for CRM: Pt called asking to add lab work for pregnancy tested to rule it out. She thinks it would be next to impossible but wants to be sure.

## 2019-04-01 NOTE — Telephone Encounter (Signed)
Please order upreg when she comes in. Thanks.

## 2019-04-01 NOTE — Patient Instructions (Addendum)
Please return in November for your complete physical.   Please come to the office for blood work.   If you have any questions or concerns, please don't hesitate to send me a message via MyChart or call the office at [phone]. Thank you for visiting with [place] today! It's our pleasure caring for you.

## 2019-04-01 NOTE — Progress Notes (Signed)
Virtual Visit via Video Note  Subjective  CC:  Chief Complaint  Patient presents with  . Abnormal TSH    She reports that she has not had a cycle in 37 days, feeling more fatigue, and always cold     I connected with Cathy Vasquez on 04/01/19 at  1:00 PM EDT by a video enabled telemedicine application and verified that I am speaking with the correct person using two identifiers. Location patient: Home Location provider: [facility] [place], [facility] Persons participating in the virtual visit: Cathy Vasquez, Cathy Oraamille L Andy, MD Rita Oharaiara Simmons, CMA  I discussed the limitations of evaluation and management by telemedicine and the availability of in person appointments. The patient expressed understanding and agreed to proceed. HPI: Cathy Vasquez is a 28 y.o. female who was contacted today to address the problems listed above in the chief complaint.  Subclinical Hypothyroidism f/u: Cathy Vasquez is a 28 y.o. female who presents for follow up of sublclinical hypothyroidism. In November she had a mildly elevated TSh with nl T3, Free t4. She was asymptomatic at that time. But now: Current symptoms: change in energy level, heat / cold intolerance and feels cold all the time and late for menses.  . Patient denies diarrhea, nervousness, palpitations and cp, sob, lightheadedness. Symptoms have been progressive over the last few monthsDue for lab recheck.   Aortic root dilitation on BB: bp's run low at times but no orthostatic sxs. No palpitations, pain or sxs of chf.   Menses: typically q 33 days but now at day 37. About twice a year will miss a cycle. Husband had vasectomy. No breast ttp or abdominal pain.   ROS: + polyuria, polydipsia, neg for tremor, sweats or fever.  Assessment  1. Subclinical hypothyroidism   2. Other fatigue   3. Menstrual period late      Plan   thyroid:  Recheck levels. Start meds if has progressed to hypothyroidism. sxs could be due this. Other possibilities  discussed. She will come in for labs.   Monitor menstrual cycles. Check home upreg if > 7 days late.   Monitor bp on BB. Keep hydrated.  I discussed the assessment and treatment plan with the patient. The patient was provided an opportunity to ask questions and all were answered. The patient agreed with the plan and demonstrated an understanding of the instructions.   The patient was advised to call back or seek an in-person evaluation if the symptoms worsen or if the condition fails to improve as anticipated. Follow up: November for cpe.   Visit date not found  No orders of the defined types were placed in this encounter.     I reviewed the patients updated PMH, FH, and SocHx.    Patient Active Problem List   Diagnosis Date Noted  . Atopic eczema 10/15/2018  . Dyshidrotic eczema 10/15/2018  . Venous insufficiency (chronic) (peripheral) 07/07/2018  . Congenital aortic stenosis 11/07/2017  . Non-rheumatic mitral regurgitation 03/31/2017  . Ascending aorta dilatation (HCC) 03/28/2017  . H/O Ross procedure 08/25/2016  . Varicose veins of right leg with edema 07/25/2016  . Gastroesophageal reflux disease without esophagitis 12/29/2015   Current Meds  Medication Sig  . cetirizine (ZYRTEC) 10 MG tablet Take 10 mg by mouth daily.  . metoprolol succinate (TOPROL-XL) 25 MG 24 hr tablet Take 12.5 mg by mouth daily.    Allergies: Patient is allergic to other; amoxicillin; cefaclor; and penicillin g. Family History: Patient family history includes Diabetes in  her paternal grandfather; Healthy in her daughter, sister, sister, and son; Heart disease in her maternal grandmother; Hypertension in her father and mother; Migraines in her maternal aunt. Social History:  Patient  reports that she has never smoked. She has never used smokeless tobacco. She reports previous alcohol use. She reports previous drug use.  Review of Systems: Constitutional: Negative for fever malaise or anorexia  Cardiovascular: negative for chest pain Respiratory: negative for SOB or persistent cough Gastrointestinal: negative for abdominal pain  OBJECTIVE Vitals: Wt 115 lb (52.2 kg)   LMP 02/23/2019   BMI 21.73 kg/m  General: no acute distress , A&Ox3 Appears well. Normal affect  Cathy Ora, MD

## 2019-04-01 NOTE — Telephone Encounter (Signed)
Please advise 

## 2019-04-02 ENCOUNTER — Other Ambulatory Visit: Payer: Self-pay

## 2019-04-02 ENCOUNTER — Other Ambulatory Visit (INDEPENDENT_AMBULATORY_CARE_PROVIDER_SITE_OTHER): Payer: BLUE CROSS/BLUE SHIELD

## 2019-04-02 DIAGNOSIS — E039 Hypothyroidism, unspecified: Secondary | ICD-10-CM | POA: Diagnosis not present

## 2019-04-02 DIAGNOSIS — N926 Irregular menstruation, unspecified: Secondary | ICD-10-CM

## 2019-04-02 LAB — CBC WITH DIFFERENTIAL/PLATELET
Basophils Absolute: 0 10*3/uL (ref 0.0–0.1)
Basophils Relative: 0.5 % (ref 0.0–3.0)
Eosinophils Absolute: 0.1 10*3/uL (ref 0.0–0.7)
Eosinophils Relative: 2.1 % (ref 0.0–5.0)
HCT: 42.6 % (ref 36.0–46.0)
Hemoglobin: 14.5 g/dL (ref 12.0–15.0)
Lymphocytes Relative: 35.6 % (ref 12.0–46.0)
Lymphs Abs: 2.4 10*3/uL (ref 0.7–4.0)
MCHC: 34.1 g/dL (ref 30.0–36.0)
MCV: 93.6 fl (ref 78.0–100.0)
Monocytes Absolute: 0.4 10*3/uL (ref 0.1–1.0)
Monocytes Relative: 5.8 % (ref 3.0–12.0)
Neutro Abs: 3.8 10*3/uL (ref 1.4–7.7)
Neutrophils Relative %: 56 % (ref 43.0–77.0)
Platelets: 216 10*3/uL (ref 150.0–400.0)
RBC: 4.55 Mil/uL (ref 3.87–5.11)
RDW: 13.6 % (ref 11.5–15.5)
WBC: 6.8 10*3/uL (ref 4.0–10.5)

## 2019-04-02 LAB — BASIC METABOLIC PANEL
BUN: 11 mg/dL (ref 6–23)
CO2: 28 mEq/L (ref 19–32)
Calcium: 9.2 mg/dL (ref 8.4–10.5)
Chloride: 101 mEq/L (ref 96–112)
Creatinine, Ser: 0.69 mg/dL (ref 0.40–1.20)
GFR: 101.27 mL/min (ref >60.00)
Glucose, Bld: 88 mg/dL (ref 70–99)
Potassium: 3.8 mEq/L (ref 3.5–5.1)
Sodium: 136 mEq/L (ref 135–145)

## 2019-04-02 LAB — TSH: TSH: 3.68 u[IU]/mL (ref 0.35–4.50)

## 2019-04-02 LAB — T4, FREE: Free T4: 0.9 ng/dL (ref 0.60–1.60)

## 2019-04-02 LAB — POCT URINE PREGNANCY: Preg Test, Ur: NEGATIVE

## 2019-04-03 LAB — T3: T3, Total: 112 ng/dL (ref 76–181)

## 2019-05-13 DIAGNOSIS — I8311 Varicose veins of right lower extremity with inflammation: Secondary | ICD-10-CM | POA: Diagnosis not present

## 2019-09-13 DIAGNOSIS — I77810 Thoracic aortic ectasia: Secondary | ICD-10-CM | POA: Diagnosis not present

## 2019-09-13 DIAGNOSIS — Z954 Presence of other heart-valve replacement: Secondary | ICD-10-CM | POA: Diagnosis not present

## 2019-09-13 DIAGNOSIS — Q230 Congenital stenosis of aortic valve: Secondary | ICD-10-CM | POA: Diagnosis not present

## 2019-10-20 ENCOUNTER — Encounter: Payer: Self-pay | Admitting: Family Medicine

## 2019-10-20 ENCOUNTER — Ambulatory Visit (INDEPENDENT_AMBULATORY_CARE_PROVIDER_SITE_OTHER): Payer: BC Managed Care – PPO | Admitting: Family Medicine

## 2019-10-20 DIAGNOSIS — I77810 Thoracic aortic ectasia: Secondary | ICD-10-CM

## 2019-10-20 DIAGNOSIS — I340 Nonrheumatic mitral (valve) insufficiency: Secondary | ICD-10-CM

## 2019-10-20 DIAGNOSIS — Z23 Encounter for immunization: Secondary | ICD-10-CM

## 2019-10-20 DIAGNOSIS — Z0000 Encounter for general adult medical examination without abnormal findings: Secondary | ICD-10-CM

## 2019-10-20 DIAGNOSIS — L301 Dyshidrosis [pompholyx]: Secondary | ICD-10-CM

## 2019-10-20 DIAGNOSIS — L209 Atopic dermatitis, unspecified: Secondary | ICD-10-CM

## 2019-10-20 LAB — LIPID PANEL
Cholesterol: 155 mg/dL (ref 0–200)
HDL: 45.8 mg/dL (ref >39.00)
LDL Cholesterol: 95 mg/dL (ref 0–99)
NonHDL: 109.59
Total CHOL/HDL Ratio: 3
Triglycerides: 71 mg/dL (ref 0.0–149.0)
VLDL: 14.2 mg/dL (ref 0.0–40.0)

## 2019-10-20 LAB — CBC WITH DIFFERENTIAL/PLATELET
Basophils Absolute: 0 10*3/uL (ref 0.0–0.1)
Basophils Relative: 0.7 % (ref 0.0–3.0)
Eosinophils Absolute: 0.2 10*3/uL (ref 0.0–0.7)
Eosinophils Relative: 4.3 % (ref 0.0–5.0)
HCT: 39.6 % (ref 36.0–46.0)
Hemoglobin: 13.4 g/dL (ref 12.0–15.0)
Lymphocytes Relative: 31.9 % (ref 12.0–46.0)
Lymphs Abs: 1.7 10*3/uL (ref 0.7–4.0)
MCHC: 33.8 g/dL (ref 30.0–36.0)
MCV: 94.4 fl (ref 78.0–100.0)
Monocytes Absolute: 0.4 10*3/uL (ref 0.1–1.0)
Monocytes Relative: 6.7 % (ref 3.0–12.0)
Neutro Abs: 3.1 10*3/uL (ref 1.4–7.7)
Neutrophils Relative %: 56.4 % (ref 43.0–77.0)
Platelets: 213 10*3/uL (ref 150.0–400.0)
RBC: 4.19 Mil/uL (ref 3.87–5.11)
RDW: 13 % (ref 11.5–15.5)
WBC: 5.5 10*3/uL (ref 4.0–10.5)

## 2019-10-20 LAB — COMPREHENSIVE METABOLIC PANEL
ALT: 9 U/L (ref 0–35)
AST: 13 U/L (ref 0–37)
Albumin: 4.4 g/dL (ref 3.5–5.2)
Alkaline Phosphatase: 26 U/L — ABNORMAL LOW (ref 39–117)
BUN: 11 mg/dL (ref 6–23)
CO2: 28 mEq/L (ref 19–32)
Calcium: 9.3 mg/dL (ref 8.4–10.5)
Chloride: 104 mEq/L (ref 96–112)
Creatinine, Ser: 0.75 mg/dL (ref 0.40–1.20)
GFR: 91.62 mL/min (ref >60.00)
Glucose, Bld: 86 mg/dL (ref 70–99)
Potassium: 4 mEq/L (ref 3.5–5.1)
Sodium: 139 mEq/L (ref 135–145)
Total Bilirubin: 0.5 mg/dL (ref 0.2–1.2)
Total Protein: 6.8 g/dL (ref 6.0–8.3)

## 2019-10-20 LAB — TSH: TSH: 2.76 u[IU]/mL (ref 0.35–4.50)

## 2019-10-20 MED ORDER — TRIAMCINOLONE ACETONIDE 0.1 % EX OINT: TOPICAL_OINTMENT | CUTANEOUS | 0 refills | Status: AC

## 2019-10-20 NOTE — Patient Instructions (Signed)
Please return in 12 months for your annual complete physical; please come fasting. We will do your pap smear at that time as well.   I will release your lab results to you on your MyChart account with further instructions. Please reply with any questions.   Today you were given your flu vaccination.    If you have any questions or concerns, please don't hesitate to send me a message via MyChart or call the office at [phone]. Thank you for visiting with [place] today! It's our pleasure caring for you.   Preventive Care 65-20 Years Old, Female Preventive care refers to visits with your health care provider and lifestyle choices that can promote health and wellness. This includes:  A yearly physical exam. This may also be called an annual well check.  Regular dental visits and eye exams.  Immunizations.  Screening for certain conditions.  Healthy lifestyle choices, such as eating a healthy diet, getting regular exercise, not using drugs or products that contain nicotine and tobacco, and limiting alcohol use. What can I expect for my preventive care visit? Physical exam Your health care provider will check your:  Height and weight. This may be used to calculate body mass index (BMI), which tells if you are at a healthy weight.  Heart rate and blood pressure.  Skin for abnormal spots. Counseling Your health care provider may ask you questions about your:  Alcohol, tobacco, and drug use.  Emotional well-being.  Home and relationship well-being.  Sexual activity.  Eating habits.  Work and work Statistician.  Method of birth control.  Menstrual cycle.  Pregnancy history. What immunizations do I need?  Influenza (flu) vaccine  This is recommended every year. Tetanus, diphtheria, and pertussis (Tdap) vaccine  You may need a Td booster every 10 years. Varicella (chickenpox) vaccine  You may need this if you have not been vaccinated. Human papillomavirus (HPV)  vaccine  If recommended by your health care provider, you may need three doses over 6 months. Measles, mumps, and rubella (MMR) vaccine  You may need at least one dose of MMR. You may also need a second dose. Meningococcal conjugate (MenACWY) vaccine  One dose is recommended if you are age 72-21 years and a first-year college student living in a residence hall, or if you have one of several medical conditions. You may also need additional booster doses. Pneumococcal conjugate (PCV13) vaccine  You may need this if you have certain conditions and were not previously vaccinated. Pneumococcal polysaccharide (PPSV23) vaccine  You may need one or two doses if you smoke cigarettes or if you have certain conditions. Hepatitis A vaccine  You may need this if you have certain conditions or if you travel or work in places where you may be exposed to hepatitis A. Hepatitis B vaccine  You may need this if you have certain conditions or if you travel or work in places where you may be exposed to hepatitis B. Haemophilus influenzae type b (Hib) vaccine  You may need this if you have certain conditions. You may receive vaccines as individual doses or as more than one vaccine together in one shot (combination vaccines). Talk with your health care provider about the risks and benefits of combination vaccines. What tests do I need?  Blood tests  Lipid and cholesterol levels. These may be checked every 5 years starting at age 18.  Hepatitis C test.  Hepatitis B test. Screening  Diabetes screening. This is done by checking your blood sugar (glucose)  after you have not eaten for a while (fasting).  Sexually transmitted disease (STD) testing.  BRCA-related cancer screening. This may be done if you have a family history of breast, ovarian, tubal, or peritoneal cancers.  Pelvic exam and Pap test. This may be done every 3 years starting at age 47. Starting at age 22, this may be done every 5 years if  you have a Pap test in combination with an HPV test. Talk with your health care provider about your test results, treatment options, and if necessary, the need for more tests. Follow these instructions at home: Eating and drinking   Eat a diet that includes fresh fruits and vegetables, whole grains, lean protein, and low-fat dairy.  Take vitamin and mineral supplements as recommended by your health care provider.  Do not drink alcohol if: ? Your health care provider tells you not to drink. ? You are pregnant, may be pregnant, or are planning to become pregnant.  If you drink alcohol: ? Limit how much you have to 0-1 drink a day. ? Be aware of how much alcohol is in your drink. In the [place] one drink equals one 12 oz bottle of beer (355 mL), one 5 oz glass of wine (148 mL), or one 1 oz glass of hard liquor (44 mL). Lifestyle  Take daily care of your teeth and gums.  Stay active. Exercise for at least 30 minutes on 5 or more days each week.  Do not use any products that contain nicotine or tobacco, such as cigarettes, e-cigarettes, and chewing tobacco. If you need help quitting, ask your health care provider.  If you are sexually active, practice safe sex. Use a condom or other form of birth control (contraception) in order to prevent pregnancy and STIs (sexually transmitted infections). If you plan to become pregnant, see your health care provider for a preconception visit. What's next?  Visit your health care provider once a year for a well check visit.  Ask your health care provider how often you should have your eyes and teeth checked.  Stay up to date on all vaccines. This information is not intended to replace advice given to you by your health care provider. Make sure you discuss any questions you have with your health care provider. Document Released: 01/21/2002 Document Revised: 08/06/2018 Document Reviewed: 08/06/2018 Elsevier Patient Education  2020 [employer].

## 2019-10-20 NOTE — Progress Notes (Signed)
Subjective  Chief Complaint  Patient presents with  . Annual Exam    Fasting    HPI: Cathy Vasquez is a 28 y.o. female who presents to [facility] [place] today for a Female Wellness Visit.  She also has the concerns and/or needs as listed above in the chief complaint. These will be addressed in addition to the Health Maintenance Visit.   Wellness Visit: annual visit with health maintenance review and exam without Pap   HM: due flu vaccine. Has concerns about reaction. Pt is HR. Last pap nl in 07/2017; feels well. Healthy lifestyle. No new concerns Chronic disease management visit and/or acute problem visit:  Congenital heart defect w/ mitral regurg and aorta dilatation: most recent MRI showed smaller dilatation! This is good news. Feels well. On low dose bb. Reviewed cards records.   Eczema: stable with prn tac cream. Needs refill.   Assessment  1. Annual physical exam   2. Ascending aorta dilatation (HCC)   3. Non-rheumatic mitral regurgitation   4. Dyshidrotic eczema   5. Atopic dermatitis, unspecified type      Plan  Female Wellness Visit:  Age appropriate Health Maintenance and Prevention measures were discussed with patient. Included topics are cancer screening recommendations, ways to keep healthy (see AVS) including dietary and exercise recommendations, regular eye and dental care, use of seat belts, and avoidance of moderate alcohol use and tobacco use. Screens are up to date.   BMI: discussed patient's BMI and encouraged positive lifestyle modifications to help get to or maintain a target BMI.  HM needs and immunizations were addressed and ordered. See below for orders. See HM and immunization section for updates. Flu shot today after education.   Routine labs and screening tests ordered including cmp, cbc and lipids where appropriate.  Discussed recommendations regarding Vit D and calcium supplementation (see AVS)  Chronic disease f/u and/or  acute problem visit: (deemed necessary to be done in addition to the wellness visit):  cards:  [facility]  Refilled steroid creams.   Follow up: Return in about 1 year (around 10/19/2020) for complete physical.   Orders Placed This Encounter  Procedures  . CBC w/Diff  . CMP  . Lipids  . TSH   Meds ordered this encounter  Medications  . triamcinolone ointment (KENALOG) 0.1 %    Sig: 1 APP ONCE A DAY AT BEDTIME as needed APPLIED TOPICALLY    Dispense:  80 g    Refill:  0      Lifestyle: There is no height or weight on file to calculate BMI. Wt Readings from Last 3 Encounters:  04/01/19 115 lb (52.2 kg)  10/29/18 114 lb (51.7 kg)  10/15/18 112 lb (50.8 kg)   Diet: low fat Exercise: frequently,  Need for contraception: No, vasectomy  Patient Active Problem List   Diagnosis Date Noted  . Atopic eczema 10/15/2018  . Dyshidrotic eczema 10/15/2018  . Venous insufficiency (chronic) (peripheral) 07/07/2018  . Congenital aortic stenosis 11/07/2017  . Non-rheumatic mitral regurgitation 03/31/2017  . Ascending aorta dilatation (HCC) 03/28/2017  . H/O Ross procedure 08/25/2016  . Varicose veins of right leg with edema 07/25/2016    Cardiology's note venous insufficiency not cardiac related    Health Maintenance  Topic Date Due  . INFLUENZA VACCINE  07/10/2019  . PAP-Cervical Cytology Screening  07/09/2020  . PAP SMEAR-Modifier  07/09/2020  . TETANUS/TDAP  05/10/2026  . HIV Screening  Completed   Immunization History  Administered  Date(s) Administered  . Influenza,inj,Quad PF,6+ Mos 10/15/2018  . Influenza-Unspecified 11/20/2012, 09/08/2015  . Tdap 05/10/2016   We updated and reviewed the patient's past history in detail and it is documented below. Allergies: Patient  reports previous alcohol use. Past Medical History Patient  has a past medical history of Aortic aneurysm [facility]), Aortic stenosis, Atopic eczema (10/15/2018), Gastroesophageal reflux disease without  esophagitis (12/29/2015), Heart disease, Heart murmur, and Migraines. Past Surgical History Patient  has a past surgical history that includes Cardiac surgery (05/17/2003); Wisdom tooth extraction; and Zachary George procedure. Social History   Socioeconomic History  . Marital status: Married    Spouse name: Not on file  . Number of children: 2  . Years of education: Not on file  . Highest education level: Not on file  Occupational History  . Occupation: stay at home  Social Needs  . Financial resource strain: Not on file  . Food insecurity    Worry: Not on file    Inability: Not on file  . Transportation needs    Medical: Not on file    Non-medical: Not on file  Tobacco Use  . Smoking status: Never Smoker  . Smokeless tobacco: Never Used  Substance and Sexual Activity  . Alcohol use: Not Currently  . Drug use: Not Currently  . Sexual activity: Yes    Partners: Male    Birth control/protection: Other-see comments    Comment: Vasectomy   Lifestyle  . Physical activity    Days per week: Not on file    Minutes per session: Not on file  . Stress: Not on file  Relationships  . Social Herbalist on phone: Not on file    Gets together: Not on file    Attends religious service: Not on file    Active member of club or organization: Not on file    Attends meetings of clubs or organizations: Not on file    Relationship status: Not on file  Other Topics Concern  . Not on file  Social History Narrative  . Not on file   Family History  Problem Relation Age of Onset  . Hypertension Mother   . Hypertension Father   . Migraines Maternal Aunt   . Heart disease Maternal Grandmother   . Diabetes Paternal Grandfather   . Healthy Sister   . Healthy Daughter   . Healthy Son   . Healthy Sister     Review of Systems: Constitutional: negative for fever or malaise Ophthalmic: negative for photophobia, double vision or loss of vision Cardiovascular: negative for chest pain,  dyspnea on exertion, or new LE swelling Respiratory: negative for SOB or persistent cough Gastrointestinal: negative for abdominal pain, change in bowel habits or melena Genitourinary: negative for dysuria or gross hematuria, no abnormal uterine bleeding or disharge Musculoskeletal: negative for new gait disturbance or muscular weakness Integumentary: negative for new or persistent rashes, no breast lumps Neurological: negative for TIA or stroke symptoms Psychiatric: negative for SI or delusions Allergic/Immunologic: negative for hives  Patient Care Team    Relationship Specialty Notifications Start End  Leamon Arnt, MD PCP - General Family Medicine  07/07/18   Ellis Parents, MD Attending Physician Internal Medicine  04/02/19   Geraldo Pitter, MD Referring Physician Cardiology  04/02/19   Jari Pigg, MD Consulting Physician Dermatology  04/02/19   Melissa Noon, [place] Referring Physician Optometry  04/02/19   Kary Kos, MD Consulting Physician Neurosurgery  04/02/19   Ortho, [place]  Specialist  04/02/19   Irene Limbo, DDS Referring Physician Dentistry  04/02/19   Beryle Flock, PT Physical Therapist Physical Therapy  04/02/19   Ginette Otto, [facility]    04/02/19     Objective  Vitals: There were no vitals taken for this visit. General:  Well developed, well nourished, no acute distress  Psych:  Alert and orientedx3,normal mood and affect HEENT:  Normocephalic, atraumatic, non-icteric sclera, PERRL, oropharynx is clear without mass or exudate, supple neck without adenopathy, mass or thyromegaly Cardiovascular:  Normal S1, S2, RRR with systolic murmur, nondisplaced PMI Respiratory:  Good breath sounds bilaterally, CTAB with normal respiratory effort Gastrointestinal: normal bowel sounds, soft, non-tender, no noted masses. No HSM MSK: no deformities, contusions. Joints are without erythema or swelling. Spine and CVA region are nontender Skin:  Warm, no rashes or suspicious lesions  noted Neurologic:    Mental status is normal. CN 2-11 are normal. Gross motor and sensory exams are normal. Normal gait. No tremor Breast Exam: No mass, skin retraction or nipple discharge is appreciated in either breast. No axillary adenopathy. Fibrocystic changes are not noted     Commons side effects, risks, benefits, and alternatives for medications and treatment plan prescribed today were discussed, and the patient expressed understanding of the given instructions. Patient is instructed to call or message via MyChart if he/she has any questions or concerns regarding our treatment plan. No barriers to understanding were identified. We discussed Red Flag symptoms and signs in detail. Patient expressed understanding regarding what to do in case of urgent or emergency type symptoms.   Medication list was reconciled, printed and provided to the patient in AVS. Patient instructions and summary information was reviewed with the patient as documented in the AVS. This note was prepared with assistance of Dragon voice recognition software. Occasional wrong-word or sound-a-like substitutions may have occurred due to the inherent limitations of voice recognition software

## 2019-11-07 DIAGNOSIS — G43109 Migraine with aura, not intractable, without status migrainosus: Secondary | ICD-10-CM | POA: Diagnosis not present

## 2019-11-08 ENCOUNTER — Telehealth: Payer: Self-pay | Admitting: Family Medicine

## 2019-11-08 NOTE — Telephone Encounter (Signed)
[facility] RECORD [facility] Patient Name: Cathy Vasquez Gender: Female DOB: 10-18-1991 Age: 28 Y 61 M 22 D Return Phone Number: [phone] (Primary), [phone] (Secondary) Address: City/State/Zip: [place] [address] Client [facility] Client [name] [place] [facility] Contact Type Call Who Is Calling Patient / Member / Family / Caregiver Call Type Triage / Clinical Relationship To Patient Self Return Phone Number [phone] (Primary) Chief Complaint VISION - sudden loss or sudden decrease (NOT blurred vision) Reason for Call Symptomatic / Request for [place] states yesterday she felt onset of migraine, seeing flashes and spots, did not get head ache just vision problems still continuing even with eyes closed Translation No Nurse Assessment Nurse: Susy Manor, RN, Megan Date/Time (Eastern Time): 11/07/2019 8:12:38 AM Confirm and document reason for call. If symptomatic, describe symptoms. ---Caller states she started having symptoms of migraine yesterday, but then she never got a migraine. Caller states she is having vision problems. Caller states she is seeing splotchy spot in her vision in her right eye. Has the patient had close contact with a person known or suspected to have the novel coronavirus illness OR traveled / lives in area with major community spread (including international travel) in the last 14 days from the onset of symptoms? * If Asymptomatic, screen for exposure and travel within the last 14 days. ---No Does the patient have any new or worsening symptoms? ---Yes Will a triage be completed? ---Yes Related visit to physician within the last 2 weeks? ---No Does the PT have any chronic conditions? (i.e. diabetes, asthma, this includes High risk factors for pregnancy, etc.) ---Yes List chronic  conditions. ---aortic aneurism Is the patient pregnant or possibly pregnant? (Ask all females between the ages of 75-55) ---No Is this a behavioral health or substance abuse call? ---No PLEASE NOTE: All timestamps contained within this report are represented as [place] Standard Time. CONFIDENTIALTY NOTICE: This fax transmission is intended only for the addressee. It contains information that is legally privileged, confidential or otherwise protected from use or disclosure. If you are not the intended recipient, you are strictly prohibited from reviewing, disclosing, copying using or disseminating any of this information or taking any action in reliance on or regarding this information. If you have received this fax in error, please notify us immediately by telephone so that we can arrange for its return to [place]. Phone: [phone], Toll-Free: [phone], Fax: [phone] Page: 2 of 2 Call Id: [id number] Guidelines Guideline Title Affirmed Question Affirmed Notes Nurse Date/Time Eilene Ghazi Time) Vision Loss or Change [1] Blurred vision or visual changes AND [2] present now AND [3] sudden onset or new (e.g., minutes, hours, days) (Exception: seeing floaters / black specks OR previously diagnosed migraine headaches with same symptoms) Susy Manor, RN, Megan 11/07/2019 8:15:04 AM Disp. Time Eilene Ghazi Time) Disposition Final User 11/07/2019 8:10:34 AM Send to Urgent Queue Dalia Heading 11/07/2019 8:16:50 AM Go to ED Now (or PCP triage) Yes Susy Manor, RN, Megan Caller Disagree/Comply Comply Caller Understands Yes PreDisposition Did not know what to do Care Advice Given Per Guideline GO TO ED NOW (OR PCP TRIAGE): ANOTHER ADULT SHOULD DRIVE: CARE ADVICE given per Vision Loss or Change (Adult) guideline. Referrals GO TO FACILITY UNDECIDED

## 2019-11-08 NOTE — Telephone Encounter (Signed)
[facility] RECORD [facility] Patient Name: Cathy Vasquez Gender: Female DOB: 1991/10/08 Age: 28 Y 80 M 22 D Return Phone Number: [phone] (Primary), [phone] (Secondary) Address: City/State/Zip: [place] [address] Client [facility] Client Site [place] [facility] [place] [facility] Contact Type Call Who Is Calling Patient / Member / Family / Caregiver Call Type Triage / Clinical Relationship To Patient Self Return Phone Number [phone] (Primary) Chief Complaint VISION - sudden loss or sudden decrease (NOT blurred vision) Reason for Call Symptomatic / Request for [place] states yesterday she felt onset of migraine, seeing flashes and spots, did not get head ache just vision problems still continuing even with eyes closed Translation No Nurse Assessment Nurse: Susy Manor, RN, Megan Date/Time (Eastern Time): 11/07/2019 8:12:38 AM Confirm and document reason for call. If symptomatic, describe symptoms. ---Caller states she started having symptoms of migraine yesterday, but then she never got a migraine. Caller states she is having vision problems. Caller states she is seeing splotchy spot in her vision in her right eye. Has the patient had close contact with a person known or suspected to have the novel coronavirus illness OR traveled / lives in area with major community spread (including international travel) in the last 14 days from the onset of symptoms? * If Asymptomatic, screen for exposure and travel within the last 14 days. ---No Does the patient have any new or worsening symptoms? ---Yes Will a triage be completed? ---Yes Related visit to physician within the last 2 weeks? ---No Does the PT have any chronic conditions? (i.e. diabetes, asthma, this includes High risk factors for pregnancy, etc.) ---Yes List chronic  conditions. ---aortic aneurism Is the patient pregnant or possibly pregnant? (Ask all females between the ages of 16-55) ---No Is this a behavioral health or substance abuse call? ---No PLEASE NOTE: All timestamps contained within this report are represented as [place] Standard Time. CONFIDENTIALTY NOTICE: This fax transmission is intended only for the addressee. It contains information that is legally privileged, confidential or otherwise protected from use or disclosure. If you are not the intended recipient, you are strictly prohibited from reviewing, disclosing, copying using or disseminating any of this information or taking any action in reliance on or regarding this information. If you have received this fax in error, please notify us immediately by telephone so that we can arrange for its return to [place]. Phone: [phone], Toll-Free: [phone], Fax: [phone] Page: 2 of 2 Call Id: [id number] Guidelines Guideline Title Affirmed Question Affirmed Notes Nurse Date/Time Eilene Ghazi Time) Vision Loss or Change [1] Blurred vision or visual changes AND [2] present now AND [3] sudden onset or new (e.g., minutes, hours, days) (Exception: seeing floaters / black specks OR previously diagnosed migraine headaches with same symptoms) Susy Manor, RN, Megan 11/07/2019 8:15:04 AM Disp. Time Eilene Ghazi Time) Disposition Final User 11/07/2019 8:10:34 AM Send to Urgent Queue Dalia Heading 11/07/2019 8:16:50 AM Go to ED Now (or PCP triage) Yes Susy Manor, RN, Megan Caller Disagree/Comply Comply Caller Understands Yes PreDisposition Did not know what to do Care Advice Given Per Guideline GO TO ED NOW (OR PCP TRIAGE): ANOTHER ADULT SHOULD DRIVE: CARE ADVICE given per Vision Loss or Change (Adult) guideline. Referrals GO TO FACILITY UNDECIDED  PER [facility]

## 2019-11-09 ENCOUNTER — Ambulatory Visit (INDEPENDENT_AMBULATORY_CARE_PROVIDER_SITE_OTHER): Payer: BC Managed Care – PPO | Admitting: Family Medicine

## 2019-11-09 ENCOUNTER — Encounter: Payer: Self-pay | Admitting: Family Medicine

## 2019-11-09 VITALS — HR 71 | Ht 61.0 in | Wt 115.0 lb

## 2019-11-09 DIAGNOSIS — H539 Unspecified visual disturbance: Secondary | ICD-10-CM

## 2019-11-09 DIAGNOSIS — G43109 Migraine with aura, not intractable, without status migrainosus: Secondary | ICD-10-CM | POA: Diagnosis not present

## 2019-11-09 DIAGNOSIS — G43B0 Ophthalmoplegic migraine, not intractable: Secondary | ICD-10-CM

## 2019-11-09 NOTE — Telephone Encounter (Signed)
Patient scheduled for an appt 11/09/19 with Dr. Jonni Sanger for migraines.

## 2019-11-09 NOTE — Progress Notes (Signed)
Virtual Visit via Video Note  Subjective  CC:  Chief Complaint  Patient presents with  . migraines/blurry vision     I connected with Raenette Rover on 11/09/19 at  4:20 PM EST by a video enabled telemedicine application and verified that I am speaking with the correct person using two identifiers. Location patient: Home Location provider: [facility] [address], Office Persons participating in the virtual visit: Afia Messenger, [place], MD Felicity Coyer, CMA  I discussed the limitations of evaluation and management by telemedicine and the availability of in person appointments. The patient expressed understanding and agreed to proceed. HPI: Cathy Vasquez is a 28 y.o. female who was contacted today to address the problems listed above in the chief complaint. . I reviewed recent uc notes and findings. Had visual changes in right eye described as a "blooming flower". Lasted for several hours. No associated headache but felt fatigued. Sxs lasted overnight but have now resolved. Normal neuro exam at [facility] visit. Feels well now. Reports has long h/o migraine with aura and extremity paresthesias. Started as child. Reports had a normal brain MRI at that time. Since, she gets monthly, likely menstrual related, migraines. Was using triptan successfully in past but this was stopped by prior pcp due to heart condition. Now uses nsaids but they don't work as well. Has never seen neurologist. Denies severe headache, neck stiffness, fevers or protracted course.   Assessment  1. Ophthalmoplegic migraine, not intractable   2. Migraine with aura and without status migrainosus, not intractable   3. Visual disturbance      Plan   migraines:  sxs are consistent with ocular migraine. Discussed management of monthly migraines with nsaids. rec inquiring with cardiologist if she can use triptans. Would prefer this if possible. Monitor sxs: if migraine sxs worsen, occur more frequently or has  atypical sxs, she will let me know. Discussed red flag sxs and reasons to access the ER. She will see ophthalmology for eye exam as well. Referral placed. rec headache log and recheck in 3 months.  I discussed the assessment and treatment plan with the patient. The patient was provided an opportunity to ask questions and all were answered. The patient agreed with the plan and demonstrated an understanding of the instructions.   The patient was advised to call back or seek an in-person evaluation if the symptoms worsen or if the condition fails to improve as anticipated. Follow up: 3 months.   Visit date not found  No orders of the defined types were placed in this encounter.     I reviewed the patients updated PMH, FH, and SocHx.    Patient Active Problem List   Diagnosis Date Noted  . Migraine, ophthalmoplegic 11/09/2019  . Migraine with aura   . Atopic eczema 10/15/2018  . Dyshidrotic eczema 10/15/2018  . Venous insufficiency (chronic) (peripheral) 07/07/2018  . Congenital aortic stenosis 11/07/2017  . Non-rheumatic mitral regurgitation 03/31/2017  . Ascending aorta dilatation (HCC) 03/28/2017  . H/O Ross procedure 08/25/2016  . Varicose veins of right leg with edema 07/25/2016   Current Meds  Medication Sig  . cetirizine (ZYRTEC) 10 MG tablet Take 10 mg by mouth daily.  . clindamycin (CLEOCIN) 300 MG capsule Take 2 capsules 30-60 minutes before your appointment  . metoprolol succinate (TOPROL-XL) 25 MG 24 hr tablet Take 12.5 mg by mouth daily.  Marland Kitchen triamcinolone ointment (KENALOG) 0.1 % 1 APP ONCE A DAY AT BEDTIME as needed  APPLIED TOPICALLY    Allergies: Patient is allergic to other; amoxicillin; cefaclor; and penicillin g. Family History: Patient family history includes Diabetes in her paternal grandfather; Healthy in her daughter, sister, sister, and son; Heart disease in her maternal grandmother; Hypertension in her father and mother; Migraines in her maternal aunt. Social  History:  Patient  reports that she has never smoked. She has never used smokeless tobacco. She reports previous alcohol use. She reports previous drug use.  Review of Systems: Constitutional: Negative for fever malaise or anorexia Cardiovascular: negative for chest pain Respiratory: negative for SOB or persistent cough Gastrointestinal: negative for abdominal pain  OBJECTIVE Vitals: Pulse 71   Ht 5\' 1"  (1.549 m)   Wt 115 lb (52.2 kg)   LMP 10/18/2019 (Exact Date)   SpO2 99%   BMI 21.73 kg/m  General: no acute distress , A&Ox3 Neuro: normal speech  Leamon Arnt, MD

## 2019-11-15 DIAGNOSIS — I77810 Thoracic aortic ectasia: Secondary | ICD-10-CM | POA: Diagnosis not present

## 2019-11-15 DIAGNOSIS — I371 Nonrheumatic pulmonary valve insufficiency: Secondary | ICD-10-CM | POA: Diagnosis not present

## 2019-11-15 DIAGNOSIS — I080 Rheumatic disorders of both mitral and aortic valves: Secondary | ICD-10-CM | POA: Diagnosis not present

## 2019-11-15 DIAGNOSIS — Q230 Congenital stenosis of aortic valve: Secondary | ICD-10-CM | POA: Diagnosis not present

## 2019-11-15 DIAGNOSIS — Z954 Presence of other heart-valve replacement: Secondary | ICD-10-CM | POA: Diagnosis not present

## 2019-11-17 DIAGNOSIS — G43B0 Ophthalmoplegic migraine, not intractable: Secondary | ICD-10-CM | POA: Diagnosis not present

## 2019-11-17 DIAGNOSIS — H539 Unspecified visual disturbance: Secondary | ICD-10-CM | POA: Diagnosis not present

## 2019-11-17 DIAGNOSIS — G43909 Migraine, unspecified, not intractable, without status migrainosus: Secondary | ICD-10-CM | POA: Diagnosis not present

## 2019-11-17 DIAGNOSIS — H47233 Glaucomatous optic atrophy, bilateral: Secondary | ICD-10-CM | POA: Diagnosis not present

## 2019-11-19 DIAGNOSIS — I340 Nonrheumatic mitral (valve) insufficiency: Secondary | ICD-10-CM | POA: Diagnosis not present

## 2019-11-19 DIAGNOSIS — Z954 Presence of other heart-valve replacement: Secondary | ICD-10-CM | POA: Diagnosis not present

## 2019-11-19 DIAGNOSIS — I77810 Thoracic aortic ectasia: Secondary | ICD-10-CM | POA: Diagnosis not present

## 2019-11-19 DIAGNOSIS — Q230 Congenital stenosis of aortic valve: Secondary | ICD-10-CM | POA: Diagnosis not present

## 2019-11-25 ENCOUNTER — Encounter: Payer: Self-pay | Admitting: Family Medicine

## 2019-11-25 MED ORDER — SUMATRIPTAN SUCCINATE 50 MG PO TABS: 50.0000 mg | ORAL_TABLET | Freq: Once | ORAL | 1 refills | Status: DC

## 2020-01-28 ENCOUNTER — Encounter: Payer: Self-pay | Admitting: Family Medicine

## 2020-01-28 MED ORDER — CLINDAMYCIN HCL 300 MG PO CAPS: ORAL_CAPSULE | ORAL | 3 refills | Status: DC

## 2020-02-10 ENCOUNTER — Other Ambulatory Visit: Payer: Self-pay

## 2020-02-10 ENCOUNTER — Encounter: Payer: Self-pay | Admitting: Family Medicine

## 2020-02-11 ENCOUNTER — Encounter: Payer: Self-pay | Admitting: Family Medicine

## 2020-02-11 ENCOUNTER — Ambulatory Visit (INDEPENDENT_AMBULATORY_CARE_PROVIDER_SITE_OTHER): Payer: BC Managed Care – PPO | Admitting: Family Medicine

## 2020-02-11 VITALS — BP 118/66 | HR 75 | Temp 97.8°F | Ht 61.0 in | Wt 117.4 lb

## 2020-02-11 DIAGNOSIS — M26622 Arthralgia of left temporomandibular joint: Secondary | ICD-10-CM

## 2020-02-11 NOTE — Patient Instructions (Signed)
Please follow up if symptoms do not improve or as needed.   Temporomandibular Joint Syndrome  Temporomandibular joint syndrome (TMJ syndrome) is a condition that causes pain in the temporomandibular joints. These joints are located near your ears and allow your jaw to open and close. For people with TMJ syndrome, chewing, biting, or other movements of the jaw can be difficult or painful. TMJ syndrome is often mild and goes away within a few weeks. However, sometimes the condition becomes a long-term (chronic) problem. What are the causes? This condition may be caused by:  Grinding your teeth or clenching your jaw. Some people do this when they are under stress.  Arthritis.  Injury to the jaw.  Head or neck injury.  Teeth or dentures that are not aligned well. In some cases, the cause of TMJ syndrome may not be known. What are the signs or symptoms? The most common symptom of this condition is an aching pain on the side of the head in the area of the TMJ. Other symptoms may include:  Pain when moving your jaw, such as when chewing or biting.  Being unable to open your jaw all the way.  Making a clicking sound when you open your mouth.  Headache.  Earache.  Neck or shoulder pain. How is this diagnosed? This condition may be diagnosed based on:  Your symptoms and medical history.  A physical exam. Your health care provider may check the range of motion of your jaw.  Imaging tests, such as X-rays or an MRI. You may also need to see your dentist, who will determine if your teeth and jaw are lined up correctly. How is this treated? TMJ syndrome often goes away on its own. If treatment is needed, the options may include:  Eating soft foods and applying ice or heat.  Medicines to relieve pain or inflammation.  Medicines or massage to relax the muscles.  A splint, bite plate, or mouthpiece to prevent teeth grinding or jaw clenching.  Relaxation techniques or counseling to  help reduce stress.  A therapy for pain in which an electrical current is applied to the nerves through the skin (transcutaneous electrical nerve stimulation).  Acupuncture. This is sometimes helpful to relieve pain.  Jaw surgery. This is rarely needed. Follow these instructions at home:  Eating and drinking  Eat a soft diet if you are having trouble chewing.  Avoid foods that require a lot of chewing. Do not chew gum. General instructions  Take over-the-counter and prescription medicines only as told by your health care provider.  If directed, put ice on the painful area. ? Put ice in a plastic bag. ? Place a towel between your skin and the bag. ? Leave the ice on for 20 minutes, 2-3 times a day.  Apply a warm, wet cloth (warm compress) to the painful area as directed.  Massage your jaw area and do any jaw stretching exercises as told by your health care provider.  If you were given a splint, bite plate, or mouthpiece, wear it as told by your health care provider.  Keep all follow-up visits as told by your health care provider. This is important. Contact a health care provider if:  You are having trouble eating.  You have new or worsening symptoms. Get help right away if:  Your jaw locks open or closed. Summary  Temporomandibular joint syndrome (TMJ syndrome) is a condition that causes pain in the temporomandibular joints. These joints are located near your ears and allow  your jaw to open and close.  TMJ syndrome is often mild and goes away within a few weeks. However, sometimes the condition becomes a long-term (chronic) problem.  Symptoms include an aching pain on the side of the head in the area of the TMJ, pain when chewing or biting, and being unable to open your jaw all the way. You may also make a clicking sound when you open your mouth.  TMJ syndrome often goes away on its own. If treatment is needed, it may include medicines to relieve pain, reduce  inflammation, or relax the muscles. A splint, bite plate, or mouthpiece may also be used to prevent teeth grinding or jaw clenching. This information is not intended to replace advice given to you by your health care provider. Make sure you discuss any questions you have with your health care provider. Document Revised: 02/06/2018 Document Reviewed: 01/06/2018 Elsevier Patient Education  2020 [employer].

## 2020-02-11 NOTE — Progress Notes (Signed)
Subjective  CC:  Chief Complaint  Patient presents with  . Facial Swelling    left side jawline swellig. noticed swelling sunday evening. left ear started popping yesterday. no pain. noticed it's there while chewing    HPI: Cathy Vasquez is a 29 y.o. female who presents to the office today to address the problems listed above in the chief complaint.  See mychart message  Noted 5 days ago a "different" sensation on the lower side of her left face near jawline. Then thought it was swollen. Since has some hypersensitivity and a discomfort with closing her jaw tightly. Notices it with chewing as well. nontender area, no redness. ? Swelling but no defined mass/nodule. No salivation issues. No f/c/s. No ear pain but hears popping when opens jaw.  Started after chewing gum on Sunday.  Assessment  1. Arthralgia of left temporomandibular joint      Plan   TMJ:  Educated. Discussed supportive care with ibuprofen and ice. Jaw rest. Mouth guard in future if needed or if notes jaw clenching/grinding at night. Reassured.   Follow up: Return if symptoms worsen or fail to improve.  Visit date not found  No orders of the defined types were placed in this encounter.  No orders of the defined types were placed in this encounter.     I reviewed the patients updated PMH, FH, and SocHx.    Patient Active Problem List   Diagnosis Date Noted  . Migraine, ophthalmoplegic 11/09/2019  . Migraine with aura   . Atopic eczema 10/15/2018  . Dyshidrotic eczema 10/15/2018  . Venous insufficiency (chronic) (peripheral) 07/07/2018  . Congenital aortic stenosis 11/07/2017  . Non-rheumatic mitral regurgitation 03/31/2017  . MVP (mitral valve prolapse) 03/31/2017  . Ascending aorta dilatation (HCC) 03/28/2017  . H/O Ross procedure 08/25/2016  . Varicose veins of right leg with edema 07/25/2016   Current Meds  Medication Sig  . cetirizine (ZYRTEC) 10 MG tablet Take 10 mg by mouth daily.  . clindamycin  (CLEOCIN) 300 MG capsule Take 2 capsules 30-60 minutes before your appointment as needed  . metoprolol succinate (TOPROL-XL) 25 MG 24 hr tablet Take 12.5 mg by mouth daily.  Marland Kitchen triamcinolone ointment (KENALOG) 0.1 % 1 APP ONCE A DAY AT BEDTIME as needed APPLIED TOPICALLY    Allergies: Patient is allergic to other; amoxicillin; cefaclor; and penicillin g. Family History: Patient family history includes Diabetes in her paternal grandfather; Healthy in her daughter, sister, sister, and son; Heart disease in her maternal grandmother; Hypertension in her father and mother; Migraines in her maternal aunt. Social History:  Patient  reports that she has never smoked. She has never used smokeless tobacco. She reports previous alcohol use. She reports previous drug use.  Review of Systems: Constitutional: Negative for fever malaise or anorexia Cardiovascular: negative for chest pain Respiratory: negative for SOB or persistent cough Gastrointestinal: negative for abdominal pain  Objective  Vitals: BP 118/66 (BP Location: Right Arm, Patient Position: Sitting, Cuff Size: Normal)   Pulse 75   Temp 97.8 F (36.6 C) (Temporal)   Ht 5\' 1"  (1.549 m)   Wt 117 lb 6.4 oz (53.3 kg)   LMP 01/14/2020   SpO2 99%   BMI 22.18 kg/m  General: no acute distress , A&Ox3 HEENT: PEERL, conjunctiva normal, Oropharynx moist,neck is supple, left facial assymetry w/o edema, swelling or facial/jaw or neck mass. Oral cavity w/o mass or erythema. TMs nl bilaterally, + bilateral tmj clicking w/o ttp No LAD or swollen salivary  glands. Non palpable parotid. nontender jaw bone.  Neuro: no facial droop, symmetric smile, crn 2-12 intact     Commons side effects, risks, benefits, and alternatives for medications and treatment plan prescribed today were discussed, and the patient expressed understanding of the given instructions. Patient is instructed to call or message via MyChart if he/she has any questions or concerns  regarding our treatment plan. No barriers to understanding were identified. We discussed Red Flag symptoms and signs in detail. Patient expressed understanding regarding what to do in case of urgent or emergency type symptoms.   Medication list was reconciled, printed and provided to the patient in AVS. Patient instructions and summary information was reviewed with the patient as documented in the AVS. This note was prepared with assistance of Dragon voice recognition software. Occasional wrong-word or sound-a-like substitutions may have occurred due to the inherent limitations of voice recognition software  This visit occurred during the SARS-CoV-2 public health emergency.  Safety protocols were in place, including screening questions prior to the visit, additional usage of staff PPE, and extensive cleaning of exam room while observing appropriate contact time as indicated for disinfecting solutions.

## 2020-08-16 ENCOUNTER — Telehealth: Payer: Self-pay | Admitting: Family Medicine

## 2020-08-16 NOTE — Telephone Encounter (Signed)
Patient is concerned regarding her menstrual cycle she is late her husband  Has had a vasectomy  So she knows she's not pregnant but her hair is also falling outs so she thinks its hormone based and also  would like to get a flu shot and would like to know if she could wait to address this and get her flu shot until her apt in nov or should she come in sooner can we let he know via my chart. Please advise

## 2020-08-16 NOTE — Telephone Encounter (Signed)
Please schedule her for an office visit. Next available. No need to use same day spot. Thanks.

## 2020-08-16 NOTE — Telephone Encounter (Signed)
Would you like the patient to come in office? You have a opening Friday at 3pm. Please advise

## 2020-08-17 NOTE — Telephone Encounter (Signed)
LVM asking patient to call the office back to get scheduled.  

## 2020-09-04 ENCOUNTER — Other Ambulatory Visit: Payer: Self-pay

## 2020-09-04 ENCOUNTER — Encounter: Payer: Self-pay | Admitting: Family Medicine

## 2020-09-04 ENCOUNTER — Ambulatory Visit (INDEPENDENT_AMBULATORY_CARE_PROVIDER_SITE_OTHER): Payer: BC Managed Care – PPO | Admitting: Family Medicine

## 2020-09-04 VITALS — BP 110/72 | HR 67 | Temp 97.6°F | Resp 16 | Ht 61.0 in | Wt 119.2 lb

## 2020-09-04 DIAGNOSIS — E221 Hyperprolactinemia: Secondary | ICD-10-CM | POA: Diagnosis not present

## 2020-09-04 DIAGNOSIS — Z23 Encounter for immunization: Secondary | ICD-10-CM

## 2020-09-04 DIAGNOSIS — Q230 Congenital stenosis of aortic valve: Secondary | ICD-10-CM

## 2020-09-04 DIAGNOSIS — G43109 Migraine with aura, not intractable, without status migrainosus: Secondary | ICD-10-CM | POA: Diagnosis not present

## 2020-09-04 DIAGNOSIS — L659 Nonscarring hair loss, unspecified: Secondary | ICD-10-CM

## 2020-09-04 DIAGNOSIS — N926 Irregular menstruation, unspecified: Secondary | ICD-10-CM

## 2020-09-04 DIAGNOSIS — I77810 Thoracic aortic ectasia: Secondary | ICD-10-CM

## 2020-09-04 NOTE — Progress Notes (Signed)
Subjective  CC:  Chief Complaint  Patient presents with  . Menstrual Problem    last cycle Sept 19th  . Alopecia    last 6 months   . increased heart rate    HPI: Cathy Vasquez is a 29 y.o. female who presents to the office today to address the problems listed above in the chief complaint.  29 year old presents due to 3 to 21-month history of abnormal menstrual cycles.  She has logged her cycles regularly.  Her cycles on average are 28 to 35 days.  Over the last several months she has had 2 or 3 that were 42 to 58 days.  Her bleeding has not been heavy or painful.  She does not use birth control.  She had 2 home pregnancy tests that were recently negative.  Her LMP was last week.  Also has had several more frequent migraines, to which were more severe in quality of pain and associated with nausea and vomiting.  Over the course of her migraine history, her headaches have been at times menstrual related.  She has had no neuro symptoms.  Imitrex is typically helpful.  She is also noticed some hair thinning without patchy hair loss or rash in the scalp.  No itching.  She has history of congenital heart defect status post surgery with persistent aortic aneurysm and uses a beta-blocker to keep her heart rate on average 60-80.  She is noted that she is here to use the beta-blocker more frequently.  At time her heart rate will be in the 90s.  She worries that all of these symptoms together are due to some abnormality.  She otherwise feels well.  No fevers, chills, shortness of breath, lower extremity edema, abdominal pain.  No pelvic pain.  Denies numbness, low risk for STDs.  Prefers not to use hormonal contraceptives.   Assessment  1. Irregular menstrual cycle   2. Migraine with aura and without status migrainosus, not intractable   3. Hair thinning   4. Need for immunization against influenza   5. Congenital aortic stenosis   6. Ascending aorta dilatation (HCC)      Plan   Regular  menses: DU B, could be anovulatory cycles or other.  No pelvic pain or symptoms of infection.  Continue to monitor.  If persist or worsens, pelvic ultrasound and/or GYN eval.  Check labs  Migraine with aura, likely related to change in hormonal status, menses.  Educated and reassured.  Continue Imitrex, Zofran as needed and follow-up if worsening.  No red flags noted.  Hair thinning: No sign of infection or rash.  Possible telogen effluvium.  Monitor.  Reassured.  Check thyroid.  Recommend multivitamin  Cardiology: Continue beta-blocker.  Check thyroid and CBC to ensure no anemia.  Follow-up with cardiology if worsens.  Follow up: Complete physical 10/20/2020  Orders Placed This Encounter  Procedures  . Flu Vaccine QUAD 36+ mos IM  . CBC with Differential/Platelet  . COMPLETE METABOLIC PANEL WITH GFR  . TSH  . T4, free  . T3  . Prolactin   No orders of the defined types were placed in this encounter.     I reviewed the patients updated PMH, FH, and SocHx.    Patient Active Problem List   Diagnosis Date Noted  . Migraine, ophthalmoplegic 11/09/2019  . Migraine with aura   . Atopic eczema 10/15/2018  . Dyshidrotic eczema 10/15/2018  . Venous insufficiency (chronic) (peripheral) 07/07/2018  . Congenital aortic stenosis 11/07/2017  .  Non-rheumatic mitral regurgitation 03/31/2017  . MVP (mitral valve prolapse) 03/31/2017  . Ascending aorta dilatation (HCC) 03/28/2017  . H/O Ross procedure 08/25/2016  . Varicose veins of right leg with edema 07/25/2016   Current Meds  Medication Sig  . cetirizine (ZYRTEC) 10 MG tablet Take 10 mg by mouth daily.  . clindamycin (CLEOCIN) 300 MG capsule Take 2 capsules 30-60 minutes before your appointment as needed  . metoprolol succinate (TOPROL-XL) 25 MG 24 hr tablet Take 12.5 mg by mouth daily.  Marland Kitchen triamcinolone ointment (KENALOG) 0.1 % 1 APP ONCE A DAY AT BEDTIME as needed APPLIED TOPICALLY    Allergies: Patient is allergic to other,  amoxicillin, cefaclor, and penicillin g. Family History: Patient family history includes Diabetes in her paternal grandfather; Healthy in her daughter, sister, sister, and son; Heart disease in her maternal grandmother; Hypertension in her father and mother; Migraines in her maternal aunt. Social History:  Patient  reports that she has never smoked. She has never used smokeless tobacco. She reports previous alcohol use. She reports previous drug use.  Review of Systems: Constitutional: Negative for fever malaise or anorexia Cardiovascular: negative for chest pain Respiratory: negative for SOB or persistent cough Gastrointestinal: negative for abdominal pain  Objective  Vitals: BP 110/72   Pulse 67   Temp 97.6 F (36.4 C) (Temporal)   Resp 16   Ht 5\' 1"  (1.549 m)   Wt 119 lb 3.2 oz (54.1 kg)   SpO2 99%   BMI 22.52 kg/m  General: no acute distress , A&Ox3, appears well HEENT: PEERL, conjunctiva normal, neck is supple Cardiovascular:  RRR without murmur or gallop.  Respiratory:  Good breath sounds bilaterally, CTAB with normal respiratory effort Gastrointestinal: soft, flat abdomen, normal active bowel sounds, no palpable masses, no hepatosplenomegaly, no appreciated hernias Skin:  Warm, no rashes     Commons side effects, risks, benefits, and alternatives for medications and treatment plan prescribed today were discussed, and the patient expressed understanding of the given instructions. Patient is instructed to call or message via MyChart if he/she has any questions or concerns regarding our treatment plan. No barriers to understanding were identified. We discussed Red Flag symptoms and signs in detail. Patient expressed understanding regarding what to do in case of urgent or emergency type symptoms.   Medication list was reconciled, printed and provided to the patient in AVS. Patient instructions and summary information was reviewed with the patient as documented in the AVS. This  note was prepared with assistance of Dragon voice recognition software. Occasional wrong-word or sound-a-like substitutions may have occurred due to the inherent limitations of voice recognition software  This visit occurred during the SARS-CoV-2 public health emergency.  Safety protocols were in place, including screening questions prior to the visit, additional usage of staff PPE, and extensive cleaning of exam room while observing appropriate contact time as indicated for disinfecting solutions.

## 2020-09-04 NOTE — Patient Instructions (Signed)
Please follow up as scheduled for your next visit with me: 10/20/2020 for physical and pap smear.   I will release your lab results to you on your MyChart account with further instructions. Please reply with any questions.    If you have any questions or concerns, please don't hesitate to send me a message via MyChart or call the office at [phone]. Thank you for visiting with [place] today! It's our pleasure caring for you.  Dysfunctional Uterine Bleeding Dysfunctional uterine bleeding is abnormal bleeding from the uterus. Dysfunctional uterine bleeding includes:  A menstrual period that comes earlier or later than usual.  A menstrual period that is lighter or heavier than usual, or has large blood clots.  Vaginal bleeding between menstrual periods.  Skipping one or more menstrual periods.  Vaginal bleeding after sex.  Vaginal bleeding after menopause. Follow these instructions at home: Eating and drinking   Eat well-balanced meals. Include foods that are high in iron, such as liver, meat, shellfish, green leafy vegetables, and eggs.  To prevent or treat constipation, your health care provider may recommend that you: ? Drink enough fluid to keep your urine pale yellow. ? Take over-the-counter or prescription medicines. ? Eat foods that are high in fiber, such as beans, whole grains, and fresh fruits and vegetables. ? Limit foods that are high in fat and processed sugars, such as fried or sweet foods. Medicines  Take over-the-counter and prescription medicines only as told by your health care provider.  Do not change medicines without talking with your health care provider.  Aspirin or medicines that contain aspirin may make the bleeding worse. Do not take those medicines: ? During the week before your menstrual period. ? During your menstrual period.  If you were prescribed iron pills, take them as told by your health care provider. Iron pills help to replace iron that your  body loses because of this condition. Activity  If you need to change your sanitary pad or tampon more than one time every 2 hours: ? Lie in bed with your feet raised (elevated). ? Place a cold pack on your lower abdomen. ? Rest as much as possible until the bleeding stops or slows down.  Do not try to lose weight until the bleeding has stopped and your blood iron level is back to normal. General instructions   For two months, write down: ? When your menstrual period starts. ? When your menstrual period ends. ? When any abnormal vaginal bleeding occurs. ? What problems you notice.  Keep all follow up visits as told by your health care provider. This is important. Contact a health care provider if you:  Feel light-headed or weak.  Have nausea and vomiting.  Cannot eat or drink without vomiting.  Feel dizzy or have diarrhea while you are taking medicines.  Are taking birth control pills or hormones, and you want to change them or stop taking them. Get help right away if:  You develop a fever or chills.  You need to change your sanitary pad or tampon more than one time per hour.  Your vaginal bleeding becomes heavier, or your flow contains clots more often.  You develop pain in your abdomen.  You lose consciousness.  You develop a rash. Summary  Dysfunctional uterine bleeding is abnormal bleeding from the uterus.  It includes menstrual bleeding of abnormal duration, volume, or regularity.  Bleeding after sex and after menopause are also considered dysfunctional uterine bleeding. This information is not intended to replace  advice given to you by your health care provider. Make sure you discuss any questions you have with your health care provider. Document Revised: 05/06/2018 Document Reviewed: 05/06/2018 Elsevier Patient Education  2020 [employer].

## 2020-09-05 LAB — COMPLETE METABOLIC PANEL WITH GFR
AG Ratio: 1.6 (calc) (ref 1.0–2.5)
ALT: 8 U/L (ref 6–29)
AST: 14 U/L (ref 10–30)
Albumin: 4.4 g/dL (ref 3.6–5.1)
Alkaline phosphatase (APISO): 32 U/L (ref 31–125)
BUN: 7 mg/dL (ref 7–25)
CO2: 28 mmol/L (ref 20–32)
Calcium: 9.9 mg/dL (ref 8.6–10.2)
Chloride: 104 mmol/L (ref 98–110)
Creat: 0.75 mg/dL (ref 0.50–1.10)
GFR, Est African American: 125 mL/min/{1.73_m2} (ref >=60)
GFR, Est Non African American: 108 mL/min/{1.73_m2} (ref >=60)
Globulin: 2.7 g/dL (calc) (ref 1.9–3.7)
Glucose, Bld: 77 mg/dL (ref 65–99)
Potassium: 4.5 mmol/L (ref 3.5–5.3)
Sodium: 139 mmol/L (ref 135–146)
Total Bilirubin: 0.4 mg/dL (ref 0.2–1.2)
Total Protein: 7.1 g/dL (ref 6.1–8.1)

## 2020-09-05 LAB — CBC WITH DIFFERENTIAL/PLATELET
Absolute Monocytes: 395 cells/uL (ref 200–950)
Basophils Absolute: 47 cells/uL (ref 0–200)
Basophils Relative: 0.8 %
Eosinophils Absolute: 71 cells/uL (ref 15–500)
Eosinophils Relative: 1.2 %
HCT: 42.7 % (ref 35.0–45.0)
Hemoglobin: 13.9 g/dL (ref 11.7–15.5)
Lymphs Abs: 1882 cells/uL (ref 850–3900)
MCH: 31.6 pg (ref 27.0–33.0)
MCHC: 32.6 g/dL (ref 32.0–36.0)
MCV: 97 fL (ref 80.0–100.0)
MPV: 10.5 fL (ref 7.5–12.5)
Monocytes Relative: 6.7 %
Neutro Abs: 3505 cells/uL (ref 1500–7800)
Neutrophils Relative %: 59.4 %
Platelets: 225 10*3/uL (ref 140–400)
RBC: 4.4 10*6/uL (ref 3.80–5.10)
RDW: 12.9 % (ref 11.0–15.0)
Total Lymphocyte: 31.9 %
WBC: 5.9 10*3/uL (ref 3.8–10.8)

## 2020-09-05 LAB — TSH: TSH: 3.8 mIU/L

## 2020-09-05 LAB — PROLACTIN: Prolactin: 5 ng/mL

## 2020-09-05 LAB — T4, FREE: Free T4: 1.2 ng/dL (ref 0.8–1.8)

## 2020-09-05 LAB — T3: T3, Total: 109 ng/dL (ref 76–181)

## 2020-10-20 ENCOUNTER — Encounter: Payer: BC Managed Care – PPO | Admitting: Family Medicine

## 2020-11-07 ENCOUNTER — Encounter: Payer: BC Managed Care – PPO | Admitting: Family Medicine

## 2020-11-14 ENCOUNTER — Ambulatory Visit (INDEPENDENT_AMBULATORY_CARE_PROVIDER_SITE_OTHER): Payer: BC Managed Care – PPO | Admitting: Family Medicine

## 2020-11-14 ENCOUNTER — Other Ambulatory Visit: Payer: Self-pay

## 2020-11-14 ENCOUNTER — Encounter: Payer: Self-pay | Admitting: Family Medicine

## 2020-11-14 ENCOUNTER — Other Ambulatory Visit (HOSPITAL_COMMUNITY)
Admission: RE | Admit: 2020-11-14 | Discharge: 2020-11-14 | Disposition: A | Discharge status: Home / Self Care | Payer: BC Managed Care – PPO | Source: Ambulatory Visit | Attending: Family Medicine | Admitting: Family Medicine

## 2020-11-14 VITALS — BP 98/60 | HR 69 | Temp 98.3°F | Ht 61.0 in | Wt 116.0 lb

## 2020-11-14 DIAGNOSIS — Z124 Encounter for screening for malignant neoplasm of cervix: Secondary | ICD-10-CM | POA: Diagnosis not present

## 2020-11-14 DIAGNOSIS — I77810 Thoracic aortic ectasia: Secondary | ICD-10-CM | POA: Diagnosis not present

## 2020-11-14 DIAGNOSIS — Z1159 Encounter for screening for other viral diseases: Secondary | ICD-10-CM

## 2020-11-14 DIAGNOSIS — I341 Nonrheumatic mitral (valve) prolapse: Secondary | ICD-10-CM

## 2020-11-14 DIAGNOSIS — Z954 Presence of other heart-valve replacement: Secondary | ICD-10-CM

## 2020-11-14 DIAGNOSIS — Z0000 Encounter for general adult medical examination without abnormal findings: Secondary | ICD-10-CM

## 2020-11-14 NOTE — Progress Notes (Signed)
Subjective  Chief Complaint  Patient presents with  . Annual Exam    fasting, skin tag/mole under left breast  . Gynecologic Exam  . Nail Problem    possible fungus on left big toe    HPI: Cathy Vasquez is a 29 y.o. female who presents to [facility] [place] [facility] today for a Female Wellness Visit.  She also has the concerns and/or needs as listed above in the chief complaint. These will be addressed in addition to the Health Maintenance Visit.   Wellness Visit: annual visit with health maintenance review and exam with Pap   HM: 29 year old female here for physical with Pap smear.  Last visit she is experiencing some irregular vaginal bleeding.  Since then, her menstrual cycles have returned to normal.  No longer having menstrual related migraines.  Feeling better about all of these things.  Immunizations are up-to-date.  She is fasting for lab work.  She would like a few moles checked.  Also some whiteness on the great toenail of her left foot.  She wonders if this is fungus.  No pain or thickening.  She is married and has 2 children.  Husband has had a vasectomy.  No mood disorders. Chronic disease management visit and/or acute problem visit:  History of congenital heart disease status post Ross procedure and currently monitoring for ascending aorta dilatation.  On daily beta-blocker but blood pressure runs low.  Even on low-dose.  She notes her resting heart rate is elevated often in the 80s or 90s.  Her cardiologist would like this to be in the 60s to 70s.  She does not exercise regularly.  She has no chest pain or shortness of breath.   Assessment  1. Annual physical exam   2. Cervical cancer screening   3. Need for hepatitis C screening test   4. Ascending aorta dilatation (HCC)   5. H/O Lorton procedure   6. MVP (mitral valve prolapse)      Plan  Female Wellness Visit:  Age appropriate Health Maintenance and Prevention measures were discussed with patient.  Included topics are cancer screening recommendations, ways to keep healthy (see AVS) including dietary and exercise recommendations, regular eye and dental care, use of seat belts, and avoidance of moderate alcohol use and tobacco use.  Pap smear done today.  BMI: discussed patient's BMI and encouraged positive lifestyle modifications to help get to or maintain a target BMI.  HM needs and immunizations were addressed and ordered. See below for orders. See HM and immunization section for updates.  Routine labs were recently done and all reviewed.  They were normal.  Check hep C screen today with lipid panel.  Discussed recommendations regarding Vit D and calcium supplementation (see AVS)  Chronic disease f/u and/or acute problem visit: (deemed necessary to be done in addition to the wellness visit):  Congenital heart disease with aorta dilatation: Recommend follow-up with cardiology.  Consider increasing cardiovascular/aerobic exercise to help with her resting heart rate.  She is not having any symptoms of arrhythmia.  Consider changing to a different beta-blocker for better tolerance.  Will monitor toenail and skin changes.  Currently appears benign  Follow up: Return in about 1 year (around 11/14/2021) for complete physical.   Orders Placed This Encounter  Procedures  . Hepatitis C antibody  . Lipid panel   No orders of the defined types were placed in this encounter.     Lifestyle: Body mass index is 21.92 kg/m. Wt Readings  from Last 3 Encounters:  11/14/20 116 lb (52.6 kg)  09/04/20 119 lb 3.2 oz (54.1 kg)  02/11/20 117 lb 6.4 oz (53.3 kg)    Patient Active Problem List   Diagnosis Date Noted  . Migraine, ophthalmoplegic 11/09/2019  . Migraine with aura     Gets aura, visual changes, numbness in arm and face. Migraine equivalents. Started at age 78; normal brain MRI at that time. Responsive to triptans and nsaids. Menstrual migraine association. Headaches were worse with  pregnancy.   . Atopic eczema 10/15/2018  . Dyshidrotic eczema 10/15/2018  . Venous insufficiency (chronic) (peripheral) 07/07/2018  . Congenital aortic stenosis 11/07/2017  . Non-rheumatic mitral regurgitation 03/31/2017  . MVP (mitral valve prolapse) 03/31/2017  . Ascending aorta dilatation (HCC) 03/28/2017  . H/O Ross procedure 08/25/2016  . Varicose veins of right leg with edema 07/25/2016    Cardiology's note venous insufficiency not cardiac related    Health Maintenance  Topic Date Due  . Hepatitis C Screening  Never done  . PAP-Cervical Cytology Screening  07/09/2020  . PAP SMEAR-Modifier  07/09/2020  . TETANUS/TDAP  05/10/2026  . INFLUENZA VACCINE  Completed  . COVID-19 Vaccine  Completed  . HIV Screening  Completed   Immunization History  Administered Date(s) Administered  . Influenza,inj,Quad PF,6+ Mos 11/20/2012, 10/06/2017, 10/15/2018, 10/20/2019, 09/04/2020  . Influenza-Unspecified 11/20/2012, 09/08/2015  . PFIZER SARS-COV-2 Vaccination 03/03/2020, 03/24/2020  . Tdap 05/10/2016   We updated and reviewed the patient's past history in detail and it is documented below. Allergies: Patient  reports previous alcohol use. Past Medical History Patient  has a past medical history of Aortic aneurysm [facility]), Aortic stenosis, Atopic eczema (10/15/2018), Gastroesophageal reflux disease without esophagitis (12/29/2015), Heart disease, Heart murmur, and Migraines. Past Surgical History Patient  has a past surgical history that includes Cardiac surgery (05/17/2003); Wisdom tooth extraction; Bridgette Habermann procedure; and Venous ablation (Right, 2020). Social History   Socioeconomic History  . Marital status: Married    Spouse name: Not on file  . Number of children: 2  . Years of education: Not on file  . Highest education level: Not on file  Occupational History  . Occupation: stay at home  Tobacco Use  . Smoking status: Never Smoker  . Smokeless tobacco: Never Used  Vaping  Use  . Vaping Use: Never used  Substance and Sexual Activity  . Alcohol use: Not Currently  . Drug use: Not Currently  . Sexual activity: Yes    Partners: Male    Birth control/protection: Other-see comments    Comment: Vasectomy   Other Topics Concern  . Not on file  Social History Narrative  . Not on file   Social Determinants of Health   Financial Resource Strain:   . Difficulty of Paying Living Expenses: Not on file  Food Insecurity:   . Worried About Programme researcher, broadcasting/film/video in the Last Year: Not on file  . Ran Out of Food in the Last Year: Not on file  Transportation Needs:   . Lack of Transportation (Medical): Not on file  . Lack of Transportation (Non-Medical): Not on file  Physical Activity:   . Days of Exercise per Week: Not on file  . Minutes of Exercise per Session: Not on file  Stress:   . Feeling of Stress : Not on file  Social Connections:   . Frequency of Communication with Friends and Family: Not on file  . Frequency of Social Gatherings with Friends and Family: Not on file  .  Attends Religious Services: Not on file  . Active Member of Clubs or Organizations: Not on file  . Attends BankerClub or Organization Meetings: Not on file  . Marital Status: Not on file   Family History  Problem Relation Age of Onset  . Hypertension Mother   . Hypertension Father   . Migraines Maternal Aunt   . Heart disease Maternal Grandmother   . Diabetes Paternal Grandfather   . Healthy Sister   . Healthy Daughter   . Healthy Son   . Healthy Sister     Review of Systems: Constitutional: negative for fever or malaise Ophthalmic: negative for photophobia, double vision or loss of vision Cardiovascular: negative for chest pain, dyspnea on exertion, or new LE swelling Respiratory: negative for SOB or persistent cough Gastrointestinal: negative for abdominal pain, change in bowel habits or melena Genitourinary: negative for dysuria or gross hematuria, no abnormal uterine bleeding or  disharge Musculoskeletal: negative for new gait disturbance or muscular weakness Integumentary: negative for new or persistent rashes, no breast lumps Neurological: negative for TIA or stroke symptoms Psychiatric: negative for SI or delusions Allergic/Immunologic: negative for hives  Patient Care Team    Relationship Specialty Notifications Start End  Willow OraAndy, Wisam Siefring L, MD PCP - General Family Medicine  07/07/18   Elana Almenaldo, Gary T, MD Attending Physician Internal Medicine  04/02/19   Clovis RileyZhou, Li, MD Referring Physician Cardiology  04/02/19   Elmon ElseGould, Karen, MD Consulting Physician Dermatology  04/02/19   Manning CharityGould, Jason, OD Referring Physician Optometry  04/02/19   Donalee Citrinram, Gary, MD Consulting Physician Neurosurgery  04/02/19   Ortho, [facility]  Specialist  04/02/19   Irene LimboFuller, Sandra, DDS Referring Physician Dentistry  04/02/19   Beryle FlockBanker, Brad, PT Physical Therapist Physical Therapy  04/02/19   [place], [facility]    04/02/19     Objective  Vitals: BP 98/60   Pulse 69   Temp 98.3 F (36.8 C) (Temporal)   Ht 5\' 1"  (1.549 m)   Wt 116 lb (52.6 kg)   LMP 10/28/2020 (Exact Date)   SpO2 99%   BMI 21.92 kg/m  General:  Well developed, well nourished, no acute distress  Psych:  Alert and orientedx3,normal mood and affect HEENT:  Normocephalic, atraumatic, non-icteric sclera, PERRL, supple neck without adenopathy, mass or thyromegaly Cardiovascular:  Normal S1, S2, regular rate and rhythm with murmur well-healed surgical scar Respiratory:  Good breath sounds bilaterally, CTAB with normal respiratory effort Gastrointestinal: normal bowel sounds, soft, non-tender, no noted masses. No HSM MSK: no deformities, contusions. Joints are without erythema or swelling.  Skin:  Warm, no rashes or suspicious lesions noted, benign-appearing moles on chest and left foot.  Great toenail with mild whitening, no flaking or thickening Neurologic:    Mental status is normal. Gross motor and sensory exams are normal.  Normal gait. No tremor Breast Exam: No mass, skin retraction or nipple discharge is appreciated in either breast. No axillary adenopathy. Fibrocystic changes are not noted Pelvic Exam: Normal external genitalia, no vulvar or vaginal lesions present. Clear cervix w/o CMT. Bimanual exam reveals a nontender fundus w/o masses, nl size. No adnexal masses present. No inguinal adenopathy. A PAP smear was performed.     Commons side effects, risks, benefits, and alternatives for medications and treatment plan prescribed today were discussed, and the patient expressed understanding of the given instructions. Patient is instructed to call or message via MyChart if he/she has any questions or concerns regarding our treatment plan. No barriers to understanding were  identified. We discussed Red Flag symptoms and signs in detail. Patient expressed understanding regarding what to do in case of urgent or emergency type symptoms.   Medication list was reconciled, printed and provided to the patient in AVS. Patient instructions and summary information was reviewed with the patient as documented in the AVS. This note was prepared with assistance of Dragon voice recognition software. Occasional wrong-word or sound-a-like substitutions may have occurred due to the inherent limitations of voice recognition software  This visit occurred during the SARS-CoV-2 public health emergency.  Safety protocols were in place, including screening questions prior to the visit, additional usage of staff PPE, and extensive cleaning of exam room while observing appropriate contact time as indicated for disinfecting solutions.

## 2020-11-14 NOTE — Patient Instructions (Signed)
Please return in 12 months for your annual complete physical; please come fasting.  I will release your lab results to you on your MyChart account with further instructions. Please reply with any questions.   If you have any questions or concerns, please don't hesitate to send me a message via MyChart or call the office at [phone]. Thank you for visiting with us today! It's our pleasure caring for you.  Please do these things to maintain good health!   Exercise at least 30-45 minutes a day,  4-5 days a week.   Eat a low-fat diet with lots of fruits and vegetables, up to 7-9 servings per day.  Drink plenty of water daily. Try to drink 8 8oz glasses per day.  Seatbelts can save your life. Always wear your seatbelt.  Place Smoke Detectors on every level of your home and check batteries every year.  Schedule an appointment with an eye doctor for an eye exam every 1-2 years  Avoid heavy alcohol use. If you drink, keep it to less than 2 drinks/day and not every day.  Health Care Power of Attorney.  Choose someone you trust that could speak for you if you became unable to speak for yourself.  Depression is common in our stressful world.If you're feeling down or losing interest in things you normally enjoy, please come in for a visit.  If anyone is threatening or hurting you, please get help. Physical or Emotional Violence is never OK.   

## 2020-11-15 LAB — LIPID PANEL
Cholesterol: 184 mg/dL (ref <200)
HDL: 50 mg/dL (ref >=50)
LDL Cholesterol (Calc): 113 mg/dL (calc) — ABNORMAL HIGH
Non-HDL Cholesterol (Calc): 134 mg/dL (calc) — ABNORMAL HIGH (ref <130)
Total CHOL/HDL Ratio: 3.7 (calc) (ref <5.0)
Triglycerides: 106 mg/dL (ref <150)

## 2020-11-15 LAB — CYTOLOGY - PAP: Diagnosis: NEGATIVE

## 2020-11-15 LAB — HEPATITIS C ANTIBODY
Hepatitis C Ab: NONREACTIVE
SIGNAL TO CUT-OFF: 0.02 (ref <1.00)

## 2020-11-24 DIAGNOSIS — Q230 Congenital stenosis of aortic valve: Secondary | ICD-10-CM | POA: Diagnosis not present

## 2020-11-24 DIAGNOSIS — Z954 Presence of other heart-valve replacement: Secondary | ICD-10-CM | POA: Diagnosis not present

## 2020-11-24 DIAGNOSIS — I77810 Thoracic aortic ectasia: Secondary | ICD-10-CM | POA: Diagnosis not present

## 2020-12-13 ENCOUNTER — Encounter: Payer: Self-pay | Admitting: Family Medicine

## 2020-12-15 ENCOUNTER — Ambulatory Visit (INDEPENDENT_AMBULATORY_CARE_PROVIDER_SITE_OTHER): Payer: Managed Care, Other (non HMO) | Admitting: Family Medicine

## 2020-12-15 ENCOUNTER — Other Ambulatory Visit: Payer: Self-pay

## 2020-12-15 ENCOUNTER — Encounter: Payer: Self-pay | Admitting: Family Medicine

## 2020-12-15 VITALS — BP 112/74 | HR 69 | Temp 98.2°F | Wt 113.2 lb

## 2020-12-15 DIAGNOSIS — L209 Atopic dermatitis, unspecified: Secondary | ICD-10-CM | POA: Diagnosis not present

## 2020-12-15 DIAGNOSIS — L709 Acne, unspecified: Secondary | ICD-10-CM

## 2020-12-15 DIAGNOSIS — L03012 Cellulitis of left finger: Secondary | ICD-10-CM

## 2020-12-15 DIAGNOSIS — L089 Local infection of the skin and subcutaneous tissue, unspecified: Secondary | ICD-10-CM

## 2020-12-15 DIAGNOSIS — L301 Dyshidrosis [pompholyx]: Secondary | ICD-10-CM | POA: Diagnosis not present

## 2020-12-15 MED ORDER — DOXYCYCLINE HYCLATE 100 MG PO TABS: 100.0000 mg | ORAL_TABLET | Freq: Two times a day (BID) | ORAL | 0 refills | Status: AC

## 2020-12-15 MED ORDER — CLOBETASOL PROPIONATE 0.05 % EX OINT: 1.0000 "application " | TOPICAL_OINTMENT | Freq: Two times a day (BID) | CUTANEOUS | 0 refills | Status: DC

## 2020-12-15 NOTE — Progress Notes (Signed)
Subjective  CC:  Chief Complaint  Patient presents with  . Rash    Right hand, previously treated as eczema, does experience random "patches" all over her body - come up as a "pimple" and then forms into a flat rash     HPI: Cathy Vasquez is a 30 y.o. female who presents to the office today to address the problems listed above in the chief complaint.  Patient reports having history of eczema but this was diagnosed by her last PCP and she is not sure.  She describes dry skin mainly on her hands, worse with frequent handwashing and occasional small dry patches with flaking.  She uses triamcinolone cream on her hands nightly and has done this for a long time.  Recently dryness is worsening.  She is also noticed red painful swelling around her left middle finger nailbed.  No pus noted.  No systemic symptoms or fevers.  She is concerned about her skin overall.  She has noticed several areas that will be like a small pimple.  There are some areas on her lower extremities she is concerned about.  She does shave her legs.  She also has a small darkened circular area on her left inner forearm that is been there for months.  She says it comes and goes.  She has tried nystatin cream hydrocortisone and lanolin without any resolution.  At times she reports it itches.  She has adult acne.  She uses special face washes.  She is concerned about certain papules that she is noted on her face.  She is concerned that these problems are due to a systemic condition.  She has no other systemic symptoms.   Assessment  1. Dyshidrotic eczema   2. Paronychia of left middle finger   3. Adult acne   4. Skin pustule   5. Atopic dermatitis, unspecified type      Plan   Eczema: Discussed diagnosis of dyshidrotic eczema and atopic dermatitis.  Recommend stronger steroid ointment for hands that are currently active.  Reassured, she has small atopic eczematous areas on her face and arm and torso.  She may use  over-the-counter steroid cream if needed for these.  Discussed limiting handwashing is much as possible.  Hard to do during the pandemic.  Lesion on left inner forearm: Unclear diagnosis.  Patient is anxious about skin conditions.  Refer to dermatology to help understand her skin.  Reassured.  No atypical features noted  Adult acne: Mild.  This does not bother her.  Routine skin care discussed.  Paronychial infection: Doxy for 7 days.  Follow up: As needed Visit date not found  Orders Placed This Encounter  Procedures  . Ambulatory referral to Dermatology   Meds ordered this encounter  Medications  . clobetasol ointment (TEMOVATE) 0.05 %    Sig: Apply 1 application topically 2 (two) times daily. For 2 weeks    Dispense:  30 g    Refill:  0  . doxycycline (VIBRA-TABS) 100 MG tablet    Sig: Take 1 tablet (100 mg total) by mouth 2 (two) times daily for 7 days.    Dispense:  14 tablet    Refill:  0      I reviewed the patients updated PMH, FH, and SocHx.    Patient Active Problem List   Diagnosis Date Noted  . Migraine, ophthalmoplegic 11/09/2019  . Migraine with aura   . Atopic eczema 10/15/2018  . Dyshidrotic eczema 10/15/2018  . Venous insufficiency (chronic) (  peripheral) 07/07/2018  . Congenital aortic stenosis 11/07/2017  . Non-rheumatic mitral regurgitation 03/31/2017  . MVP (mitral valve prolapse) 03/31/2017  . Ascending aorta dilatation (HCC) 03/28/2017  . H/O Ross procedure 08/25/2016  . Varicose veins of right leg with edema 07/25/2016   Current Meds  Medication Sig  . bisoprolol (ZEBETA) 5 MG tablet Take by mouth.  . cetirizine (ZYRTEC) 10 MG tablet Take 10 mg by mouth daily.  . clindamycin (CLEOCIN) 300 MG capsule Take 2 capsules 30-60 minutes before your appointment as needed  . clobetasol ointment (TEMOVATE) 0.05 % Apply 1 application topically 2 (two) times daily. For 2 weeks  . doxycycline (VIBRA-TABS) 100 MG tablet Take 1 tablet (100 mg total) by mouth 2  (two) times daily for 7 days.  Marland Kitchen triamcinolone ointment (KENALOG) 0.1 % 1 APP ONCE A DAY AT BEDTIME as needed APPLIED TOPICALLY  . [DISCONTINUED] metoprolol succinate (TOPROL-XL) 25 MG 24 hr tablet Take 12.5 mg by mouth daily.    Allergies: Patient is allergic to other, amoxicillin, cefaclor, and penicillin g. Family History: Patient family history includes Diabetes in her paternal grandfather; Healthy in her daughter, sister, sister, and son; Heart disease in her maternal grandmother; Hypertension in her father and mother; Migraines in her maternal aunt. Social History:  Patient  reports that she has never smoked. She has never used smokeless tobacco. She reports previous alcohol use. She reports previous drug use.  Review of Systems: Constitutional: Negative for fever malaise or anorexia Cardiovascular: negative for chest pain Respiratory: negative for SOB or persistent cough Gastrointestinal: negative for abdominal pain  Objective  Vitals: BP 112/74   Pulse 69   Temp 98.2 F (36.8 C) (Temporal)   Wt 113 lb 3.2 oz (51.3 kg)   SpO2 99%   BMI 21.39 kg/m  General: no acute distress , A&Ox3, anxious Skin:  Warm, multiple small papules noted on face consistent with adult acne, 2 to 3 mm flaky erythematous patch on forehead.  Left inner forearm with 3 to 4 mm hyperpigmented annular lesion with central darkening, smooth borders, nontender, no flaking.  Hyperpigmented area right mid axillary line correlates with where bra with wire rubs, right lower extremity with several papules noted.  1 scabbed lesion.  No vesicles, no urticaria, fair skin    Commons side effects, risks, benefits, and alternatives for medications and treatment plan prescribed today were discussed, and the patient expressed understanding of the given instructions. Patient is instructed to call or message via MyChart if he/she has any questions or concerns regarding our treatment plan. No barriers to understanding were  identified. We discussed Red Flag symptoms and signs in detail. Patient expressed understanding regarding what to do in case of urgent or emergency type symptoms.   Medication list was reconciled, printed and provided to the patient in AVS. Patient instructions and summary information was reviewed with the patient as documented in the AVS. This note was prepared with assistance of Dragon voice recognition software. Occasional wrong-word or sound-a-like substitutions may have occurred due to the inherent limitations of voice recognition software  This visit occurred during the SARS-CoV-2 public health emergency.  Safety protocols were in place, including screening questions prior to the visit, additional usage of staff PPE, and extensive cleaning of exam room while observing appropriate contact time as indicated for disinfecting solutions.

## 2020-12-15 NOTE — Patient Instructions (Signed)
Please follow up if symptoms do not improve or as needed.   Use the stronger steroid cream on your hands for 2 weeks.  Complete the antibiotics.   We will call you to get you scheduled with dermatology.

## 2020-12-20 ENCOUNTER — Encounter: Payer: Self-pay | Admitting: Family Medicine

## 2021-01-15 ENCOUNTER — Encounter: Payer: Self-pay | Admitting: Family Medicine

## 2021-02-12 DIAGNOSIS — Z954 Presence of other heart-valve replacement: Secondary | ICD-10-CM | POA: Diagnosis not present

## 2021-02-12 DIAGNOSIS — Q230 Congenital stenosis of aortic valve: Secondary | ICD-10-CM | POA: Diagnosis not present

## 2021-02-12 DIAGNOSIS — I371 Nonrheumatic pulmonary valve insufficiency: Secondary | ICD-10-CM | POA: Diagnosis not present

## 2021-02-12 DIAGNOSIS — I77810 Thoracic aortic ectasia: Secondary | ICD-10-CM | POA: Diagnosis not present

## 2021-03-09 DIAGNOSIS — R002 Palpitations: Secondary | ICD-10-CM | POA: Diagnosis not present

## 2021-03-09 DIAGNOSIS — Z954 Presence of other heart-valve replacement: Secondary | ICD-10-CM | POA: Diagnosis not present

## 2021-03-09 DIAGNOSIS — I77810 Thoracic aortic ectasia: Secondary | ICD-10-CM | POA: Diagnosis not present

## 2021-03-12 DIAGNOSIS — I493 Ventricular premature depolarization: Secondary | ICD-10-CM | POA: Diagnosis not present

## 2021-04-09 ENCOUNTER — Encounter: Payer: Self-pay | Admitting: Family Medicine

## 2021-04-27 NOTE — Progress Notes (Signed)
   I, Cathy Vasquez, LAT, ATC acting as a scribe for Cathy Graham, MD.  Subjective:    CC: Back and neck pain  HPI: Pt is a 30 y/o female c/o back pain ongoing since 04/21/21. MOI: Pt went on a trip and stayed in a hotel, waking up w/ a pain in her neck. The next day pt rode rides at an amusement park, carrying a backpack, and riding in a car for 2+ hours. Pt locates pain to bilat neck and through the thoracic spine, R>L. Pt will sometimes experiences pain pain bilat in the low back. Pt reports increased pain w/ inspiration and a "hot" sensation in the thoracic spine. Pt notes increased pain when reaching for things, like to get items out of a cabinet. Pt has a hx of "heart problems" and has an thoracic aortic aneurysm.  She receives her care at [facility].  She formally was receiving pediatric cardiology care there and is now receiving adult cardiology care.  She is interested in transferring her care to [facility] health and would like some recommendations.  She lives in [place]..  Radiating pain: yes LE numbness/tingling: no LE weakness: no Aggravates: breathing Treatments tried: icy hot patch, heat, ice, tylenol, IBU  Pertinent review of Systems: No fevers or chills  Relevant historical information: History thoracic aorta aneurysm   Objective:    Vitals:   04/30/21 1259  BP: (!) 179/75  Pulse: 69  SpO2: 99%   General: Well Developed, well nourished, and in no acute distress.   MSK: C-spine normal-appearing Nontender midline. Tender palpation right cervical paraspinal musculature.  Tender palpation right trapezius. Decreased cervical motion. Upper extremity strength reflexes and sensation are intact and equal throughout.    Impression and Recommendations:    Assessment and Plan: 30 y.o. female with right lateral neck pain due to cervical paraspinal muscle spasm and dysfunction.  Recommend avoiding NSAIDs if possible.  Recommend heating pad TENS unit Tylenol.  Plan  for physical therapy.  Will refer to breakthrough and at [facility]. Recheck with me if not improved.  Discussed some cardiology options.Marland Kitchen  PDMP not reviewed this encounter. Orders Placed This Encounter  Procedures  . Ambulatory referral to Physical Therapy    Referral Priority:   Routine    Referral Type:   Physical Medicine    Referral Reason:   Specialty Services Required    Requested Specialty:   Physical Therapy    Number of Visits Requested:   1   No orders of the defined types were placed in this encounter.   Discussed warning signs or symptoms. Please see discharge instructions. Patient expresses understanding.   The above documentation has been reviewed and is accurate and complete Cathy Vasquez, M.D.

## 2021-04-30 ENCOUNTER — Other Ambulatory Visit: Payer: Self-pay

## 2021-04-30 ENCOUNTER — Ambulatory Visit (INDEPENDENT_AMBULATORY_CARE_PROVIDER_SITE_OTHER): Payer: 59 | Admitting: Family Medicine

## 2021-04-30 VITALS — BP 179/75 | HR 69 | Wt 120.4 lb

## 2021-04-30 DIAGNOSIS — I77810 Thoracic aortic ectasia: Secondary | ICD-10-CM | POA: Diagnosis not present

## 2021-04-30 DIAGNOSIS — M62838 Other muscle spasm: Secondary | ICD-10-CM | POA: Diagnosis not present

## 2021-04-30 NOTE — Patient Instructions (Signed)
Thank you for coming in today.  I've referred you to Physical Therapy.  Let us know if you don't hear from them in one week.  Use heating pad and TENS unit.   Recheck if not improved.   [facility] cardiology in   Or Dr Duke Salvia or [place] or [place] or many others.    TENS UNIT: This is helpful for muscle pain and spasm.   Search and Purchase a TENS 7000 2nd edition at  www.tenspros.com or www.Amazon.com It should be less than $30.     TENS unit instructions: Do not shower or bathe with the unit on . Turn the unit off before removing electrodes or batteries . If the electrodes lose stickiness add a drop of water to the electrodes after they are disconnected from the unit and place on plastic sheet. If you continued to have difficulty, call the TENS unit company to purchase more electrodes. . Do not apply lotion on the skin area prior to use. Make sure the skin is clean and dry as this will help prolong the life of the electrodes. . After use, always check skin for unusual red areas, rash or other skin difficulties. If there are any skin problems, does not apply electrodes to the same area. . Never remove the electrodes from the unit by pulling the wires. . Do not use the TENS unit or electrodes other than as directed. . Do not change electrode placement without consultating your therapist or physician. Marland Kitchen Keep 2 fingers with between each electrode. . Wear time ratio is 2:1, on to off times.    For example on for 30 minutes off for 15 minutes and then on for 30 minutes off for 15 minutes

## 2021-05-01 ENCOUNTER — Other Ambulatory Visit: Payer: Self-pay

## 2021-05-01 DIAGNOSIS — Q230 Congenital stenosis of aortic valve: Secondary | ICD-10-CM

## 2021-05-01 DIAGNOSIS — I341 Nonrheumatic mitral (valve) prolapse: Secondary | ICD-10-CM

## 2021-05-01 DIAGNOSIS — I77810 Thoracic aortic ectasia: Secondary | ICD-10-CM

## 2021-05-04 ENCOUNTER — Encounter: Payer: Self-pay | Admitting: Family Medicine

## 2021-05-14 ENCOUNTER — Ambulatory Visit: Payer: Managed Care, Other (non HMO) | Admitting: Dermatology

## 2021-06-07 ENCOUNTER — Encounter: Payer: Self-pay | Admitting: Family Medicine

## 2021-06-08 MED ORDER — CLINDAMYCIN HCL 300 MG PO CAPS
ORAL_CAPSULE | ORAL | 1 refills | Status: DC
Start: 2021-06-08 — End: 2022-07-09

## 2021-06-20 NOTE — Progress Notes (Signed)
CARDIOLOGY CONSULT NOTE       Patient ID: Cathy Vasquez MRN: [id number] DOB/AGE: 30-02-1991 30 y.o.  Admit date: (Not on file) Referring Physician: Earma Reading  Primary Physician: Willow Ora, MD Primary Cardiologist: New Reason for Consultation: Congenital heart disease   Active Problems:   * No active hospital problems. *   HPI:  30 y.o. referred by Dr Logan Bores for congenital heart disease. She had aortic stenosis and underwent Ross Procedure in 2004.  She had two successful pregnancies in 2014, and 2017.  F/U TTE showed moderate MR likely with some MVP. Prior to [place] procedure she had 3 balloon valvuloplasties   TTE 11/15/19 EF 55-60% Normal RV Mild AR, mild-moderate MR estimated PA 37 mmHg mild PR MRA 09/13/19 aortic sinus 4.3 cm , ascending aorta 3.5 cm descending aorta 1.6 cm  Noted fatigue with lopressor changed to bisoprolol 2.5 mg Still with fatigue and low BP  She initially had ? Atresia of AV She had first very young age 68 and last age 75 all at [facility] She describes having " lost an artery / vein in right neck during on procedure   Some RLE dependant edema Using Cleosin for SBE  No chest pain, dyspnea palpitations / syncope Recent monitor at [facility] with no PAF   ROS All other systems reviewed and negative except as noted above  Past Medical History:  Diagnosis Date   Aortic aneurysm (HCC)    Aortic stenosis    Atopic eczema 10/15/2018   Gastroesophageal reflux disease without esophagitis 12/29/2015   - discussed lifestyle changes - prescribed ranitidine 12/29/15 Rx Zantac 150 mg # 60 BID with refills x 5 given(01/19/16)   Heart disease    Heart murmur    Migraines     Family History  Problem Relation Age of Onset   Hypertension Mother    Hypertension Father    Migraines Maternal Aunt    Heart disease Maternal Grandmother    Diabetes Paternal Grandfather    Healthy Sister    Healthy Daughter    Healthy Son    Healthy Sister     Social History    Socioeconomic History   Marital status: Married    Spouse name: Not on file   Number of children: 2   Years of education: Not on file   Highest education level: Not on file  Occupational History   Occupation: stay at home  Tobacco Use   Smoking status: Never   Smokeless tobacco: Never  Vaping Use   Vaping Use: Never used  Substance and Sexual Activity   Alcohol use: Not Currently   Drug use: Not Currently   Sexual activity: Yes    Partners: Male    Birth control/protection: Other-see comments    Comment: Vasectomy   Other Topics Concern   Not on file  Social History Narrative   Not on file   Social Determinants of Health   Financial Resource Strain: Not on file  Food Insecurity: Not on file  Transportation Needs: Not on file  Physical Activity: Not on file  Stress: Not on file  Social Connections: Not on file  Intimate Partner Violence: Not on file    Past Surgical History:  Procedure Laterality Date   CARDIAC SURGERY  05/17/2003   Post Ross Procedure   ROSS [facility] PROCEDURE     VENOUS ABLATION Right 2020   varicose veins/venous insufficiency   WISDOM TOOTH EXTRACTION        Current Outpatient Medications:  bisoprolol (ZEBETA) 5 MG tablet, Take by mouth., Disp: , Rfl:    cetirizine (ZYRTEC) 10 MG tablet, Take 10 mg by mouth daily., Disp: , Rfl:    clindamycin (CLEOCIN) 300 MG capsule, Take 2 capsules 30-60 minutes before your appointment as needed, Disp: 6 capsule, Rfl: 1   clobetasol ointment (TEMOVATE) 0.05 %, Apply 1 application topically 2 (two) times daily. For 2 weeks, Disp: 30 g, Rfl: 0   SUMAtriptan (IMITREX) 50 MG tablet, Take 1 tablet (50 mg total) by mouth once for 1 dose. May repeat in 2 hours if headache persists or recurs., Disp: 12 tablet, Rfl: 1   triamcinolone ointment (KENALOG) 0.1 %, 1 APP ONCE A DAY AT BEDTIME as needed APPLIED TOPICALLY, Disp: 80 g, Rfl: 0    Physical Exam: There were no vitals taken for this visit.   Affect  appropriate Healthy:  appears stated age HEENT: normal Neck supple with no adenopathy JVP normal no bruits no thyromegaly Lungs clear with no wheezing and good diaphragmatic motion Heart:  S1/S2 PR murmur LUSB MR murmur apex  no rub, gallop or click PMI normal post sternotomy  Abdomen: benighn, BS positve, no tenderness, no AAA no bruit.  No HSM or HJR Distal pulses intact with no bruits No edema Neuro non-focal Skin warm and dry No muscular weakness   Labs:   Lab Results  Component Value Date   WBC 5.9 09/04/2020   HGB 13.9 09/04/2020   HCT 42.7 09/04/2020   MCV 97.0 09/04/2020   PLT 225 09/04/2020   No results for input(s): NA, K, CL, CO2, BUN, CREATININE, CALCIUM, PROT, BILITOT, ALKPHOS, ALT, AST, GLUCOSE in the last 168 hours.  Invalid input(s): LABALBU No results found for: CKTOTAL, CKMB, CKMBINDEX, TROPONINI  Lab Results  Component Value Date   CHOL 184 11/14/2020   CHOL 155 10/20/2019   CHOL 182 10/15/2018   Lab Results  Component Value Date   HDL 50 11/14/2020   HDL 45.80 10/20/2019   HDL 49.60 10/15/2018   Lab Results  Component Value Date   LDLCALC 113 (H) 11/14/2020   LDLCALC 95 10/20/2019   LDLCALC 118 (H) 10/15/2018   Lab Results  Component Value Date   TRIG 106 11/14/2020   TRIG 71.0 10/20/2019   TRIG 72.0 10/15/2018   Lab Results  Component Value Date   CHOLHDL 3.7 11/14/2020   CHOLHDL 3 10/20/2019   CHOLHDL 4 10/15/2018   No results found for: LDLDIRECT    Radiology: No results found.  EKG: Not done    ASSESSMENT AND PLAN:   Congenital Heart/Post Ross procedure:  needs updated echo and cardiac MRI/MRI She has not had a lot of AR from homograft and PV autotransplant noted to have some PR but not a lot.   MR:  ? Prolapse history as mild to moderate on lest echo update  Left carotid easily palpable right not f/u carotid duplex ? History of traumatic injury  Palpitations : benign monitor no significant arrhythmia / PAF  Discussed  stopping beta blocker Prescribed for dilated aortic sinus but her BP and HR run very low Without it only making quality of life/symptoms worse   Echo Cardiac MRI/MRA Carotid Duplex   Time: spent reviewing old records alone from [facility] 45 minutes  F/U in a year   Signed: Charlton Haws 06/20/2021, 8:40 AM

## 2021-06-28 ENCOUNTER — Other Ambulatory Visit: Payer: Self-pay

## 2021-06-28 ENCOUNTER — Encounter: Payer: Self-pay | Admitting: Cardiovascular Disease

## 2021-06-28 ENCOUNTER — Ambulatory Visit: Payer: 59 | Admitting: Cardiovascular Disease

## 2021-06-28 VITALS — BP 116/72 | HR 55 | Ht 61.0 in | Wt 119.4 lb

## 2021-06-28 DIAGNOSIS — Z954 Presence of other heart-valve replacement: Secondary | ICD-10-CM | POA: Diagnosis not present

## 2021-06-28 DIAGNOSIS — I340 Nonrheumatic mitral (valve) insufficiency: Secondary | ICD-10-CM | POA: Diagnosis not present

## 2021-06-28 DIAGNOSIS — R9439 Abnormal result of other cardiovascular function study: Secondary | ICD-10-CM | POA: Diagnosis not present

## 2021-06-28 DIAGNOSIS — R002 Palpitations: Secondary | ICD-10-CM

## 2021-06-28 NOTE — Patient Instructions (Signed)
Medication Instructions:  Your physician recommends that you continue on your current medications as directed. Please refer to the Current Medication list given to you today.  *If you need a refill on your cardiac medications before your next appointment, please call your pharmacy*   Lab Work: Your physician recommends that you return for lab work in: 1 Week prior to Cardiac MRI   If you have labs (blood work) drawn today and your tests are completely normal, you will receive your results only by: MyChart Message (if you have MyChart) OR A paper copy in the mail If you have any lab test that is abnormal or we need to change your treatment, we will call you to review the results.   Testing/Procedures: Your physician has requested that you have an echocardiogram. Echocardiography is a painless test that uses sound waves to create images of your heart. It provides your doctor with information about the size and shape of your heart and how well your heart's chambers and valves are working. This procedure takes approximately one hour. There are no restrictions for this procedure.  Your physician has requested that you have a cardiac MRI. Cardiac MRI uses a computer to create images of your heart as its beating, producing both still and moving pictures of your heart and major blood vessels. For further information please visit [email or web address]. Please follow the instruction sheet given to you today for more information.   Your physician has requested that you have a carotid duplex. This test is an ultrasound of the carotid arteries in your neck. It looks at blood flow through these arteries that supply the brain with blood. Allow one hour for this exam. There are no restrictions or special instructions.  Follow-Up: At [facility], you and your health needs are our priority.  As part of our continuing mission to provide you with exceptional heart care, we have created designated Provider Care  Teams.  These Care Teams include your primary Cardiologist (physician) and Advanced Practice Providers (APPs -  Physician Assistants and Nurse Practitioners) who all work together to provide you with the care you need, when you need it.  We recommend signing up for the patient portal called "MyChart".  Sign up information is provided on this After Visit Summary.  MyChart is used to connect with patients for Virtual Visits (Telemedicine).  Patients are able to view lab/test results, encounter notes, upcoming appointments, etc.  Non-urgent messages can be sent to your provider as well.   To learn more about what you can do with MyChart, go to [email or web address].    Your next appointment:   1 year(s)  The format for your next appointment:   In Person  Provider:   Charlton Haws, MD   Other Instructions Thank you for choosing [facility]!

## 2021-07-04 ENCOUNTER — Other Ambulatory Visit: Payer: Self-pay | Admitting: Cardiovascular Disease

## 2021-07-04 ENCOUNTER — Other Ambulatory Visit: Payer: Self-pay

## 2021-07-04 ENCOUNTER — Ambulatory Visit (HOSPITAL_COMMUNITY)
Admission: RE | Admit: 2021-07-04 | Discharge: 2021-07-04 | Disposition: A | Discharge status: Home / Self Care | Payer: 59 | Source: Ambulatory Visit | Attending: Cardiovascular Disease | Admitting: Cardiovascular Disease

## 2021-07-04 DIAGNOSIS — I6523 Occlusion and stenosis of bilateral carotid arteries: Secondary | ICD-10-CM | POA: Diagnosis not present

## 2021-07-04 DIAGNOSIS — Q249 Congenital malformation of heart, unspecified: Secondary | ICD-10-CM | POA: Diagnosis not present

## 2021-07-04 DIAGNOSIS — Z952 Presence of prosthetic heart valve: Secondary | ICD-10-CM | POA: Diagnosis not present

## 2021-07-04 DIAGNOSIS — R9439 Abnormal result of other cardiovascular function study: Secondary | ICD-10-CM

## 2021-07-04 DIAGNOSIS — Q251 Coarctation of aorta: Secondary | ICD-10-CM | POA: Diagnosis not present

## 2021-07-11 ENCOUNTER — Other Ambulatory Visit: Payer: Self-pay

## 2021-07-11 ENCOUNTER — Ambulatory Visit (HOSPITAL_COMMUNITY)
Admission: RE | Admit: 2021-07-11 | Discharge: 2021-07-11 | Disposition: A | Discharge status: Home / Self Care | Payer: 59 | Source: Ambulatory Visit | Attending: Cardiovascular Disease | Admitting: Cardiovascular Disease

## 2021-07-11 DIAGNOSIS — Z954 Presence of other heart-valve replacement: Secondary | ICD-10-CM | POA: Insufficient documentation

## 2021-07-11 LAB — ECHOCARDIOGRAM COMPLETE
Area-P 1/2: 5.54 cm2
MV M vel: 4.93 m/s
MV Peak grad: 97.2 mmHg
S' Lateral: 3.3 cm

## 2021-07-11 NOTE — Progress Notes (Signed)
*  PRELIMINARY RESULTS* Echocardiogram 2D Echocardiogram has been performed.  Stacey Drain 07/11/2021, 10:29 AM

## 2021-07-13 ENCOUNTER — Other Ambulatory Visit: Payer: Self-pay

## 2021-07-13 DIAGNOSIS — Z954 Presence of other heart-valve replacement: Secondary | ICD-10-CM

## 2021-07-13 DIAGNOSIS — I77810 Thoracic aortic ectasia: Secondary | ICD-10-CM

## 2021-07-27 ENCOUNTER — Other Ambulatory Visit (HOSPITAL_COMMUNITY): Payer: Self-pay | Admitting: *Deleted

## 2021-07-27 ENCOUNTER — Telehealth (HOSPITAL_COMMUNITY): Payer: Self-pay | Admitting: *Deleted

## 2021-07-27 DIAGNOSIS — Z01812 Encounter for preprocedural laboratory examination: Secondary | ICD-10-CM

## 2021-07-27 NOTE — Telephone Encounter (Signed)
Reaching out to patient to offer assistance regarding upcoming cardiac imaging study; pt verbalizes understanding of appt date/time, parking situation and where to check in, pre-test NPO status, and verified current allergies; name and call back number provided for further questions should they arise  Larey Brick RN Navigator Cardiac Imaging [facility] [phone] office [phone] cell  Patient aware to get CBC lab work prior to cMRI.

## 2021-07-28 LAB — CBC
Hematocrit: 42.4 % (ref 34.0–46.6)
Hemoglobin: 13.8 g/dL (ref 11.1–15.9)
MCH: 31.3 pg (ref 26.6–33.0)
MCHC: 32.5 g/dL (ref 31.5–35.7)
MCV: 96 fL (ref 79–97)
Platelets: 249 10*3/uL (ref 150–450)
RBC: 4.41 x10E6/uL (ref 3.77–5.28)
RDW: 12.6 % (ref 11.7–15.4)
WBC: 6.9 10*3/uL (ref 3.4–10.8)

## 2021-07-31 ENCOUNTER — Ambulatory Visit (HOSPITAL_COMMUNITY): Admission: RE | Admit: 2021-07-31 | Payer: 59 | Source: Ambulatory Visit

## 2021-09-03 ENCOUNTER — Telehealth (HOSPITAL_COMMUNITY): Payer: Self-pay | Admitting: Emergency Medicine

## 2021-09-03 NOTE — Telephone Encounter (Signed)
Reaching out to patient to offer assistance regarding upcoming cardiac imaging study; pt verbalizes understanding of appt date/time, parking situation and where to check in, and verified current allergies; name and call back number provided for further questions should they arise Candia Kingsbury RN Navigator Cardiac Imaging [facility] [phone] office [phone] cell  Denies claustro  Denies implants 

## 2021-09-05 ENCOUNTER — Telehealth: Payer: Self-pay

## 2021-09-05 ENCOUNTER — Ambulatory Visit (HOSPITAL_COMMUNITY)
Admission: RE | Admit: 2021-09-05 | Discharge: 2021-09-05 | Disposition: A | Discharge status: Home / Self Care | Payer: 59 | Source: Ambulatory Visit | Attending: Cardiovascular Disease | Admitting: Cardiovascular Disease

## 2021-09-05 ENCOUNTER — Other Ambulatory Visit: Payer: Self-pay

## 2021-09-05 DIAGNOSIS — Z954 Presence of other heart-valve replacement: Secondary | ICD-10-CM | POA: Diagnosis present

## 2021-09-05 DIAGNOSIS — I77810 Thoracic aortic ectasia: Secondary | ICD-10-CM | POA: Diagnosis present

## 2021-09-05 MED ORDER — GADOBUTROL 1 MMOL/ML IV SOLN
7.0000 mL | Freq: Once | INTRAVENOUS | Status: AC | PRN
  Administered 2021-09-05: 7 mL via INTRAVENOUS

## 2021-09-05 NOTE — Telephone Encounter (Signed)
-----   Message from Wendall Stade, MD sent at 09/05/2021  1:09 PM EDT ----- Overall RV/LV EF normal post ross procedure just mild AR and PV ok

## 2021-09-05 NOTE — Telephone Encounter (Signed)
Patient notified and verbalized understanding. 

## 2021-10-08 ENCOUNTER — Ambulatory Visit: Payer: 59 | Admitting: Family Medicine

## 2021-10-09 ENCOUNTER — Encounter: Payer: Self-pay | Admitting: Family Medicine

## 2021-10-09 ENCOUNTER — Other Ambulatory Visit: Payer: Self-pay

## 2021-10-09 ENCOUNTER — Ambulatory Visit (INDEPENDENT_AMBULATORY_CARE_PROVIDER_SITE_OTHER): Payer: 59 | Admitting: Family Medicine

## 2021-10-09 VITALS — BP 120/64 | HR 80 | Temp 98.2°F | Wt 119.8 lb

## 2021-10-09 DIAGNOSIS — J069 Acute upper respiratory infection, unspecified: Secondary | ICD-10-CM

## 2021-10-09 NOTE — Progress Notes (Signed)
Subjective   CC:  Chief Complaint  Patient presents with   Sore Throat    Since last Tuesday, low grade fever on     HPI: Cathy Vasquez is a 30 y.o. female who presents to the office today to address the problems listed above in the chief complaint. Patient complains of typical URI symptoms including nasal congestion, mild sore throat, cough, and mild malaise.  The symptoms have been present for one week. She denies high fever or productive cough, shortness of breath or significant GI symptoms.  Over-the-counter cold medicines have been minimally or mildly helpful. Also lost voice. Would like lungs checked. Had covid in January. Hasn't tested this week. Feeling a bit better today. Nl appetite  Assessment  1. Viral URI with cough      Plan  URI, viral: discussed dx; no sign or sx of bacterial infection is present. Treat supportively with antihistamines, decongestants, and/or cough meds. See AVS for care instructions. Discussed possibility of mild covid infection. Pt can test at home if desires, but supportive care recommended. She will f/u is sxs of sinus infection persist/develop.  Follow up: dec for cpe   No orders of the defined types were placed in this encounter.  No orders of the defined types were placed in this encounter.     I reviewed the patients updated PMH, FH, and SocHx.    Patient Active Problem List   Diagnosis Date Noted   Migraine, ophthalmoplegic 11/09/2019   Migraine with aura    Atopic eczema 10/15/2018   Dyshidrotic eczema 10/15/2018   Venous insufficiency (chronic) (peripheral) 07/07/2018   Congenital aortic stenosis 11/07/2017   Non-rheumatic mitral regurgitation 03/31/2017   MVP (mitral valve prolapse) 03/31/2017   Ascending aorta dilatation (HCC) 03/28/2017   H/O Ross procedure 08/25/2016   Varicose veins of right leg with edema 07/25/2016   Current Meds  Medication Sig   cetirizine (ZYRTEC) 10 MG tablet Take 10 mg by mouth daily.    clindamycin (CLEOCIN) 300 MG capsule Take 2 capsules 30-60 minutes before your appointment as needed (Patient taking differently: Take 300 mg by mouth as needed. Take 2 capsules 30-60 minutes before dental appointment as needed)   clobetasol ointment (TEMOVATE) 0.05 % Apply 1 application topically 2 (two) times daily. For 2 weeks   triamcinolone ointment (KENALOG) 0.1 % 1 APP ONCE A DAY AT BEDTIME as needed APPLIED TOPICALLY    Review of Systems: Constitutional: Negative for fever malaise or anorexia Cardiovascular: negative for chest pain Respiratory: negative for SOB or pleuritic chest pain Gastrointestinal: negative for abdominal pain  Objective  Vitals: BP 120/64   Pulse 80   Temp 98.2 F (36.8 C) (Temporal)   Wt 119 lb 12.8 oz (54.3 kg)   LMP 09/22/2021 (Approximate)   SpO2 99%   BMI 22.64 kg/m  General: no acute respiratory distress , mild hoarseness Psych:  Alert and oriented, normal mood and affect HEENT: Normocephalic, TMs w/o erythema, no cervical LAD, supple neck , no sinus ttp Cardiovascular:  RRR without murmur or gallop. no peripheral edema Respiratory:  Good breath sounds bilaterally, CTAB with normal respiratory effort Skin:  Warm, no rashes Neurologic:   Mental status is normal. Commons side effects, risks, benefits, and alternatives for medications and treatment plan prescribed today were discussed, and the patient expressed understanding of the given instructions. Patient is instructed to call or message via MyChart if he/she has any questions or concerns regarding our treatment plan. No barriers to understanding were identified.  We discussed Red Flag symptoms and signs in detail. Patient expressed understanding regarding what to do in case of urgent or emergency type symptoms.  Medication list was reconciled, printed and provided to the patient in AVS. Patient instructions and summary information was reviewed with the patient as documented in the AVS. This note was  prepared with assistance of Dragon voice recognition software. Occasional wrong-word or sound-a-like substitutions may have occurred due to the inherent limitations of voice recognition software

## 2021-10-09 NOTE — Patient Instructions (Signed)
Please follow up as scheduled for your next visit with me: 12/12/2021   If you have any questions or concerns, please don't hesitate to send me a message via MyChart or call the office at [phone]. Thank you for visiting with Cathy Vasquez today! It's our pleasure caring for you.   Viral Respiratory Infection A respiratory infection is an illness that affects part of the respiratory system, such as the lungs, nose, or throat. A respiratory infection that is caused by a virus is called a viral respiratory infection. Common types of viral respiratory infections include: A cold. The flu (influenza). A respiratory syncytial virus (RSV) infection. What are the causes? This condition is caused by a virus. The virus may spread through contact with droplets or direct contact with infected people or their mucus or secretions. The virus may spread from person to person (is contagious). What are the signs or symptoms? Symptoms of this condition include: A stuffy or runny nose. A sore throat or cough. Shortness of breath or difficulty breathing. Yellow or green mucus (sputum). Other symptoms may include: A fever. Sweating or chills. Fatigue. Achy muscles. A headache. How is this diagnosed? This condition may be diagnosed based on: Your symptoms. A physical exam. Testing of secretions from the nose or throat. Chest X-ray. How is this treated? This condition may be treated with medicines, such as: Antiviral medicine. This may shorten the length of time a person has symptoms. Expectorants. These make it easier to cough up mucus. Decongestant nasal sprays. Acetaminophen or NSAIDs, such as ibuprofen, to relieve fever and pain. Antibiotic medicines are not prescribed for viral infections.This is because antibiotics are designed to kill bacteria. They do not kill viruses. Follow these instructions at home: Managing pain and congestion Take over-the-counter and prescription medicines only as told by your  health care provider. If you have a sore throat, gargle with a mixture of salt and water 3-4 times a day or as needed. To make salt water, completely dissolve -1 tsp (3-6 g) of salt in 1 cup (237 mL) of warm water. Use nose drops made from salt water to ease congestion and soften raw skin around your nose. Take 2 tsp (10 mL) of honey at bedtime to lessen coughing at night. Do not give honey to children who are younger than 1 year. Drink enough fluid to keep your urine pale yellow. This helps prevent dehydration and helps loosen up mucus. General instructions  Rest as much as possible. Do not drink alcohol. Do not use any products that contain nicotine or tobacco. These products include cigarettes, chewing tobacco, and vaping devices, such as e-cigarettes. If you need help quitting, ask your health care provider. Keep all follow-up visits. This is important. How is this prevented?   Get an annual flu shot. You may get the flu shot in late summer, fall, or winter. Ask your health care provider when you should get your flu shot. Avoid spreading your infection to other people. If you are sick: Wash your hands with soap and water often, especially after you cough or sneeze. Wash for at least 20 seconds. If soap and water are not available, use alcohol-based hand sanitizer. Cover your mouth when you cough. Cover your nose and mouth when you sneeze. Do not share cups or eating utensils. Clean commonly used objects often. Clean commonly touched surfaces. Stay home from work or school as told by your health care provider. Avoid contact with people who are sick during cold and flu season. This is  generally fall and winter. Contact a health care provider if: Your symptoms last for 10 days or longer. Your symptoms get worse over time. You have severe sinus pain in your face or forehead. The glands in your jaw or neck become very swollen. You have shortness of breath. Get help right away if  you: Feel pain or pressure in your chest. Have trouble breathing. Faint or feel like you will faint. Have severe and persistent vomiting. Feel confused or disoriented. These symptoms may represent a serious problem that is an emergency. Do not wait to see if the symptoms will go away. Get medical help right away. Call your local emergency services (911 in the [place] Do not drive yourself to the hospital. Summary A respiratory infection is an illness that affects part of the respiratory system, such as the lungs, nose, or throat. A respiratory infection that is caused by a virus is called a viral respiratory infection. Common types of viral respiratory infections include a cold, influenza, and respiratory syncytial virus (RSV) infection. Symptoms of this condition include a stuffy or runny nose, cough, fatigue, achy muscles, sore throat, and fevers or chills. Antibiotic medicines are not prescribed for viral infections. This is because antibiotics are designed to kill bacteria. They are not effective against viruses. This information is not intended to replace advice given to you by your health care provider. Make sure you discuss any questions you have with your health care provider. Document Revised: 03/01/2021 Document Reviewed: 03/01/2021 Elsevier Patient Education  2022 [employer].

## 2021-11-15 ENCOUNTER — Encounter: Payer: BC Managed Care – PPO | Admitting: Family Medicine

## 2021-12-12 ENCOUNTER — Other Ambulatory Visit: Payer: Self-pay

## 2021-12-12 ENCOUNTER — Encounter: Payer: Self-pay | Admitting: Family Medicine

## 2021-12-12 ENCOUNTER — Ambulatory Visit (INDEPENDENT_AMBULATORY_CARE_PROVIDER_SITE_OTHER): Payer: 59 | Admitting: Family Medicine

## 2021-12-12 VITALS — BP 130/80 | HR 69 | Temp 98.2°F | Ht 61.0 in | Wt 120.2 lb

## 2021-12-12 DIAGNOSIS — Z0000 Encounter for general adult medical examination without abnormal findings: Secondary | ICD-10-CM

## 2021-12-12 DIAGNOSIS — N926 Irregular menstruation, unspecified: Secondary | ICD-10-CM

## 2021-12-12 DIAGNOSIS — G43109 Migraine with aura, not intractable, without status migrainosus: Secondary | ICD-10-CM

## 2021-12-12 DIAGNOSIS — L659 Nonscarring hair loss, unspecified: Secondary | ICD-10-CM

## 2021-12-12 DIAGNOSIS — I77810 Thoracic aortic ectasia: Secondary | ICD-10-CM

## 2021-12-12 LAB — CBC WITH DIFFERENTIAL/PLATELET
Basophils Absolute: 0 10*3/uL (ref 0.0–0.1)
Basophils Relative: 0.5 % (ref 0.0–3.0)
Eosinophils Absolute: 0.1 10*3/uL (ref 0.0–0.7)
Eosinophils Relative: 1 % (ref 0.0–5.0)
HCT: 43 % (ref 36.0–46.0)
Hemoglobin: 14.2 g/dL (ref 12.0–15.0)
Lymphocytes Relative: 24.3 % (ref 12.0–46.0)
Lymphs Abs: 1.7 10*3/uL (ref 0.7–4.0)
MCHC: 33.1 g/dL (ref 30.0–36.0)
MCV: 94.7 fl (ref 78.0–100.0)
Monocytes Absolute: 0.4 10*3/uL (ref 0.1–1.0)
Monocytes Relative: 6.4 % (ref 3.0–12.0)
Neutro Abs: 4.8 10*3/uL (ref 1.4–7.7)
Neutrophils Relative %: 67.8 % (ref 43.0–77.0)
Platelets: 208 10*3/uL (ref 150.0–400.0)
RBC: 4.54 Mil/uL (ref 3.87–5.11)
RDW: 13.2 % (ref 11.5–15.5)
WBC: 7.1 10*3/uL (ref 4.0–10.5)

## 2021-12-12 LAB — LIPID PANEL
Cholesterol: 200 mg/dL (ref 0–200)
HDL: 49.3 mg/dL (ref >39.00)
LDL Cholesterol: 133 mg/dL — ABNORMAL HIGH (ref 0–99)
NonHDL: 150.42
Total CHOL/HDL Ratio: 4
Triglycerides: 87 mg/dL (ref 0.0–149.0)
VLDL: 17.4 mg/dL (ref 0.0–40.0)

## 2021-12-12 LAB — COMPREHENSIVE METABOLIC PANEL
ALT: 13 U/L (ref 0–35)
AST: 17 U/L (ref 0–37)
Albumin: 4.5 g/dL (ref 3.5–5.2)
Alkaline Phosphatase: 34 U/L — ABNORMAL LOW (ref 39–117)
BUN: 9 mg/dL (ref 6–23)
CO2: 29 mEq/L (ref 19–32)
Calcium: 9.8 mg/dL (ref 8.4–10.5)
Chloride: 102 mEq/L (ref 96–112)
Creatinine, Ser: 0.71 mg/dL (ref 0.40–1.20)
GFR: 113.84 mL/min (ref >60.00)
Glucose, Bld: 80 mg/dL (ref 70–99)
Potassium: 4.5 mEq/L (ref 3.5–5.1)
Sodium: 138 mEq/L (ref 135–145)
Total Bilirubin: 0.6 mg/dL (ref 0.2–1.2)
Total Protein: 7.3 g/dL (ref 6.0–8.3)

## 2021-12-12 LAB — VITAMIN D 25 HYDROXY (VIT D DEFICIENCY, FRACTURES): VITD: 29.36 ng/mL — ABNORMAL LOW (ref 30.00–100.00)

## 2021-12-12 LAB — TSH: TSH: 3.81 u[IU]/mL (ref 0.35–5.50)

## 2021-12-12 LAB — B12 AND FOLATE PANEL
Folate: 12.9 ng/mL (ref >5.9)
Vitamin B-12: 359 pg/mL (ref 211–911)

## 2021-12-12 NOTE — Patient Instructions (Signed)
Please return in 12 months for your annual complete physical; please come fasting.   I will release your lab results to you on your MyChart account with further instructions. Please reply with any questions.    If you have any questions or concerns, please don't hesitate to send me a message via MyChart or call the office at [phone]. Thank you for visiting with us today! It's our pleasure caring for you.    

## 2021-12-12 NOTE — Progress Notes (Signed)
Subjective  Chief Complaint  Patient presents with   Annual Exam    HPI: Cathy Vasquez is a 31 y.o. female who presents to [facility] today for a Female Wellness Visit.  She also has the concerns and/or needs as listed above in the chief complaint. These will be addressed in addition to the Health Maintenance Visit.   Wellness Visit: annual visit with health maintenance review and exam without Pap  HM: current screens. Healthy lifestyle. Feels well. Mother of 2. Husband with vasectomy. Declines flu shot today Chronic disease management visit and/or acute problem visit: Reviewed cards notes: congenital heart disease s/p ross procedure with mild aortic root dilatation; stable and actually slightly improved. Stopped BB due to side effects. Feels better.  Irregular cycles: has had 2-3 late cycles and one with sig premenstrual sxs. No pain or heavy bleeding. Similar to last several years.  Migraines: much improved. Rare now Notices hair line receding a bit. No rash on scalp or itching. Had covid in jan 2022 and noted thinning 6 months later.   Assessment  1. Annual physical exam   2. Ascending aorta dilatation (HCC)   3. Migraine with aura and without status migrainosus, not intractable   4. Irregular menstrual cycle   5. Hair thinning      Plan  Female Wellness Visit: Age appropriate Health Maintenance and Prevention measures were discussed with patient. Included topics are cancer screening recommendations, ways to keep healthy (see AVS) including dietary and exercise recommendations, regular eye and dental care, use of seat belts, and avoidance of moderate alcohol use and tobacco use.  BMI: discussed patient's BMI and encouraged positive lifestyle modifications to help get to or maintain a target BMI. HM needs and immunizations were addressed and ordered. See below for orders. See HM and immunization section for updates. Routine labs and screening tests  ordered including cmp, cbc and lipids where appropriate. Discussed recommendations regarding Vit D and calcium supplementation (see AVS)  Chronic disease f/u and/or acute problem visit: (deemed necessary to be done in addition to the wellness visit): heart:  stable per cards. Bp is elevated today but home readings remain low.  Hair thinning: possible early female pattern hair loss vs telogen effluvium. Reassured. (Neg exam and neg hair pull test) Migraines: improved.  Irregular menses: no red flags, likely DUB. Monitor. And consider ocps or mirena if worsens. Pt to clear with cards first.  Follow up: 12 mo for cpe   Orders Placed This Encounter  Procedures   B12 and Folate Panel   CBC with Differential/Platelet   Comprehensive metabolic panel   Lipid panel   TSH   VITAMIN D 25 Hydroxy (Vit-D Deficiency, Fractures)   No orders of the defined types were placed in this encounter.      Body mass index is 22.71 kg/m. Wt Readings from Last 3 Encounters:  12/12/21 120 lb 3.2 oz (54.5 kg)  10/09/21 119 lb 12.8 oz (54.3 kg)  06/28/21 119 lb 6.4 oz (54.2 kg)     Patient Active Problem List   Diagnosis Date Noted   Migraine, ophthalmoplegic 11/09/2019   Migraine with aura     Gets aura, visual changes, numbness in arm and face. Migraine equivalents. Started at age 42; normal brain MRI at that time. Responsive to triptans and nsaids. Menstrual migraine association. Headaches were worse with pregnancy.    Atopic eczema 10/15/2018   Dyshidrotic eczema 10/15/2018   Venous insufficiency (chronic) (peripheral) 07/07/2018  Congenital aortic stenosis 11/07/2017   Non-rheumatic mitral regurgitation 03/31/2017   MVP (mitral valve prolapse) 03/31/2017   Ascending aorta dilatation ([place]) 03/28/2017   H/O Ross procedure 08/25/2016   Varicose veins of right leg with edema 07/25/2016    Cardiology's note venous insufficiency not cardiac related    Health Maintenance  Topic Date Due    INFLUENZA VACCINE  06/07/2022 (Originally 07/09/2021)   PAP SMEAR-Modifier  11/15/2023   TETANUS/TDAP  05/10/2026   Hepatitis C Screening  Completed   HIV Screening  Completed   Pneumococcal Vaccine 45-34 Years old  Aged Out   HPV VACCINES  Aged Out   COVID-19 Vaccine  Discontinued   Immunization History  Administered Date(s) Administered   Influenza,inj,Quad PF,6+ Mos 11/20/2012, 10/06/2017, 10/15/2018, 10/20/2019, 09/04/2020   Influenza-Unspecified 11/20/2012, 09/08/2015   PFIZER(Purple Top)SARS-COV-2 Vaccination 03/03/2020, 03/24/2020   Tdap 05/10/2016   We updated and reviewed the patient's past history in detail and it is documented below. Allergies: Patient  reports that she does not currently use alcohol. Past Medical History Patient  has a past medical history of Aortic aneurysm [facility]), Aortic stenosis, Atopic eczema (10/15/2018), Gastroesophageal reflux disease without esophagitis (12/29/2015), Heart disease, Heart murmur, and Migraines. Past Surgical History Patient  has a past surgical history that includes Cardiac surgery (05/17/2003); Wisdom tooth extraction; Zachary George procedure; and Venous ablation (Right, 2020). Social History   Socioeconomic History   Marital status: Married    Spouse name: Not on file   Number of children: 2   Years of education: Not on file   Highest education level: Not on file  Occupational History   Occupation: stay at home  Tobacco Use   Smoking status: Never   Smokeless tobacco: Never  Vaping Use   Vaping Use: Never used  Substance and Sexual Activity   Alcohol use: Not Currently   Drug use: Not Currently   Sexual activity: Yes    Partners: Male    Birth control/protection: Other-see comments    Comment: Vasectomy   Other Topics Concern   Not on file  Social History Narrative   Not on file   Social Determinants of Health   Financial Resource Strain: Not on file  Food Insecurity: Not on file  Transportation Needs: Not on file   Physical Activity: Not on file  Stress: Not on file  Social Connections: Not on file   Family History  Problem Relation Age of Onset   Hypertension Mother    Hypertension Father    Migraines Maternal Aunt    Heart disease Maternal Grandmother    Diabetes Paternal Merchant navy officer    Healthy Sister    Healthy Daughter    Healthy Son    Healthy Sister     Review of Systems: Constitutional: negative for fever or malaise Ophthalmic: negative for photophobia, double vision or loss of vision Cardiovascular: negative for chest pain, dyspnea on exertion, or new LE swelling Respiratory: negative for SOB or persistent cough Gastrointestinal: negative for abdominal pain, change in bowel habits or melena Genitourinary: negative for dysuria or gross hematuria, no abnormal uterine bleeding or disharge Musculoskeletal: negative for new gait disturbance or muscular weakness Integumentary: negative for new or persistent rashes, no breast lumps Neurological: negative for TIA or stroke symptoms Psychiatric: negative for SI or delusions Allergic/Immunologic: negative for hives  Patient Care Team    Relationship Specialty Notifications Start End  Leamon Arnt, MD PCP - General Family Medicine  07/07/18   Ellis Parents, MD Attending Physician  Internal Medicine  04/02/19   Geraldo Pitter, MD Referring Physician Cardiology  04/02/19   Jari Pigg, MD Consulting Physician Dermatology  04/02/19   Melissa Noon, [place] Referring Physician Optometry  04/02/19   Kary Kos, MD Consulting Physician Neurosurgery  04/02/19   Ortho, [place]  Specialist  04/02/19   Augustina Mood, [place] Referring Physician Dentistry  04/02/19   Linton Rump, PT Physical Therapist Physical Therapy  04/02/19   Lady Gary, [facility]    04/02/19     Objective  Vitals: BP 130/80    Pulse 69    Temp 98.2 F (36.8 C) (Temporal)    Ht 5\' 1"  (1.549 m)    Wt 120 lb 3.2 oz (54.5 kg)    LMP 11/25/2021 (Exact Date)    SpO2 99%    BMI 22.71  kg/m  General:  Well developed, well nourished, no acute distress  Psych:  Alert and orientedx3,normal mood and affect HEENT:  Normocephalic, atraumatic, non-icteric sclera, PERRL, supple neck without adenopathy, mass or thyromegaly Cardiovascular:  Normal S1, S2, RRR without gallop, rub or murmur Respiratory:  Good breath sounds bilaterally, CTAB with normal respiratory effort Gastrointestinal: normal bowel sounds, soft, non-tender, no noted masses. No HSM MSK: no deformities, contusions. Joints are without erythema or swelling.  Skin:  Warm, no rashes or suspicious lesions noted, clear scalp. Normal hair density, thin texture. Neg hair pull test Neurologic:    Mental status is normal. Gross motor and sensory exams are normal. Normal gait. No tremor   Commons side effects, risks, benefits, and alternatives for medications and treatment plan prescribed today were discussed, and the patient expressed understanding of the given instructions. Patient is instructed to call or message via MyChart if he/she has any questions or concerns regarding our treatment plan. No barriers to understanding were identified. We discussed Red Flag symptoms and signs in detail. Patient expressed understanding regarding what to do in case of urgent or emergency type symptoms.  Medication list was reconciled, printed and provided to the patient in AVS. Patient instructions and summary information was reviewed with the patient as documented in the AVS. This note was prepared with assistance of Dragon voice recognition software. Occasional wrong-word or sound-a-like substitutions may have occurred due to the inherent limitations of voice recognition software  This visit occurred during the SARS-CoV-2 public health emergency.  Safety protocols were in place, including screening questions prior to the visit, additional usage of staff PPE, and extensive cleaning of exam room while observing appropriate contact time as indicated  for disinfecting solutions.

## 2022-01-28 ENCOUNTER — Telehealth: Payer: Self-pay | Admitting: Family Medicine

## 2022-01-28 NOTE — Telephone Encounter (Signed)
Spoke to patient- patient stated she is doing much better.- does not need to come in- soaked finger in [place] salt.- pt will call us if she needs [place].     Patient Name: Cathy Vasquez Gender: Female DOB: Dec 24, 1990 Age: 31 Y 10 M 11 D Return Phone Number: [phone] (Primary) Address: City/ State/ Zip: [place] [address] Client [facility] Night - Human resources officer [facility] Night Provider Asencion Partridge- MD Contact Type Call Who Is Calling Patient / Member / Family / Caregiver Call Type Triage / Clinical Relationship To Patient Self Return Phone Number [phone] (Primary) Chief Complaint Rash - Localized Reason for Call Symptomatic / Request for Health Information Initial Comment Caller states her daughter has Cellulitis and she thinks she may have gotten it from her daughter, she has it on her middle finger, near nail area and right hand. She stated she has eczema now and uses creams all the time. Translation No Nurse Assessment Nurse: Rennis Petty, RN, Clydie Braun Date/Time Lamount Cohen Time): 01/26/2022 9:06:54 AM Confirm and document reason for call. If symptomatic, describe symptoms. ---Caller states her daughter has Cellulitis and she thinks she may have gotten it from her daughter, she has it on her middle finger, near nail area, on finger tip and right hand. She stated she has eczema now and uses creams all the time. It's not super red, it is throbbing. States calling to avoid UC because is a heart patient. Afebrile. It is swollen on first section of finger. Does the patient have any new or worsening symptoms? ---Yes Will a triage be completed? ---Yes Related visit to physician within the last 2 weeks? ---No Does the PT have any chronic conditions? (i.e. diabetes, asthma, this includes High risk factors for pregnancy, etc.) ---Yes List chronic conditions. ---heart disease Is the patient pregnant or possibly  pregnant? (Ask all females between the ages of 68-55) ---No Is this a behavioral health or substance abuse call? ---No PLEASE NOTE: All timestamps contained within this report are represented as [place] Standard Time. CONFIDENTIALTY NOTICE: This fax transmission is intended only for the addressee. It contains information that is legally privileged, confidential or otherwise protected from use or disclosure. If you are not the intended recipient, you are strictly prohibited from reviewing, disclosing, copying using or disseminating any of this information or taking any action in reliance on or regarding this information. If you have received this fax in error, please notify us immediately by telephone so that we can arrange for its return to [place]. Phone: [phone], Toll-Free: [phone], Fax: [phone] Page: 2 of 2 Call Id: [id number] Guidelines Guideline Title Affirmed Question Affirmed Notes Nurse Date/Time Lamount Cohen Time) Rash or Redness - Localized [1] Looks infected (spreading redness, pus) AND [2] no fever Gaynelle Adu 01/26/2022 9:13:46 AM Disp. Time Lamount Cohen Time) Disposition Final User 01/26/2022 9:17:57 AM See PCP within 24 Hours Yes Rennis Petty, RN, Pablo Ledger Disagree/Comply Disagree Caller Understands Yes PreDisposition Home Care Care Advice Given Per Guideline SEE PCP WITHIN 24 HOURS: * IF OFFICE WILL BE CLOSED: You need to be seen within the next 24 hours. A clinic or an urgent care center is often a good source of care if your doctor's office is closed or you can't get an appointment. ANTIBIOTIC OINTMENT - INFECTED AREA: * Put a small amount of antibiotic ointment on the infected area 3 times per day. * You can get this over-the-counter (OTC) at a drugstore. * Use Bacitracin ointment (OTC  in [place] or Polysporin ointment (OTC in [place]) or one that you already have. * Cover the area with a clean gauze or an adhesive bandage (such as a Band-Aid). CALL BACK IF: *  Fever occurs * You become worse CARE ADVICE given per Rash - Localized and Cause Unknown (Adult) guideline. Referrals GO TO FACILITY REFUSED

## 2022-02-15 ENCOUNTER — Other Ambulatory Visit: Payer: Self-pay

## 2022-02-15 DIAGNOSIS — J320 Chronic maxillary sinusitis: Secondary | ICD-10-CM | POA: Diagnosis not present

## 2022-02-15 DIAGNOSIS — H5789 Other specified disorders of eye and adnexa: Secondary | ICD-10-CM | POA: Diagnosis not present

## 2022-02-15 DIAGNOSIS — H538 Other visual disturbances: Secondary | ICD-10-CM | POA: Diagnosis not present

## 2022-02-15 DIAGNOSIS — G43B0 Ophthalmoplegic migraine, not intractable: Secondary | ICD-10-CM | POA: Diagnosis not present

## 2022-02-15 DIAGNOSIS — R9431 Abnormal electrocardiogram [ECG] [EKG]: Secondary | ICD-10-CM | POA: Diagnosis not present

## 2022-02-15 DIAGNOSIS — G43909 Migraine, unspecified, not intractable, without status migrainosus: Secondary | ICD-10-CM | POA: Diagnosis not present

## 2022-02-15 DIAGNOSIS — I619 Nontraumatic intracerebral hemorrhage, unspecified: Secondary | ICD-10-CM | POA: Diagnosis not present

## 2022-02-15 NOTE — ED Triage Notes (Signed)
Pt started to have blurred vision around 840 which is typically what happens when she is about to have a migraine. Husband stated that she started to mumble her words which is not a typical symptom she gets with her migraines. Pt states she was starting to get nauseated and tooke a 4mg  zofran. At this time pt states she has no nausea, no headache and her speech is clear answering questions appropriately  ?

## 2022-02-16 ENCOUNTER — Emergency Department (HOSPITAL_COMMUNITY): Payer: 59

## 2022-02-16 ENCOUNTER — Emergency Department (HOSPITAL_BASED_OUTPATIENT_CLINIC_OR_DEPARTMENT_OTHER): Payer: 59

## 2022-02-16 ENCOUNTER — Emergency Department (HOSPITAL_BASED_OUTPATIENT_CLINIC_OR_DEPARTMENT_OTHER)
Admission: EM | Admit: 2022-02-16 | Discharge: 2022-02-16 | Disposition: A | Discharge status: Home / Self Care | Payer: 59 | Attending: Emergency Medicine | Admitting: Emergency Medicine

## 2022-02-16 DIAGNOSIS — H538 Other visual disturbances: Secondary | ICD-10-CM | POA: Diagnosis not present

## 2022-02-16 DIAGNOSIS — J320 Chronic maxillary sinusitis: Secondary | ICD-10-CM | POA: Diagnosis not present

## 2022-02-16 DIAGNOSIS — R4781 Slurred speech: Secondary | ICD-10-CM | POA: Diagnosis not present

## 2022-02-16 DIAGNOSIS — G43909 Migraine, unspecified, not intractable, without status migrainosus: Secondary | ICD-10-CM | POA: Diagnosis not present

## 2022-02-16 DIAGNOSIS — I619 Nontraumatic intracerebral hemorrhage, unspecified: Secondary | ICD-10-CM | POA: Diagnosis not present

## 2022-02-16 DIAGNOSIS — G43B0 Ophthalmoplegic migraine, not intractable: Secondary | ICD-10-CM

## 2022-02-16 DIAGNOSIS — R519 Headache, unspecified: Secondary | ICD-10-CM | POA: Diagnosis not present

## 2022-02-16 LAB — CBC WITH DIFFERENTIAL/PLATELET
Abs Immature Granulocytes: 0.03 10*3/uL (ref 0.00–0.07)
Basophils Absolute: 0.1 10*3/uL (ref 0.0–0.1)
Basophils Relative: 1 %
Eosinophils Absolute: 0.1 10*3/uL (ref 0.0–0.5)
Eosinophils Relative: 1 %
HCT: 42.7 % (ref 36.0–46.0)
Hemoglobin: 14.5 g/dL (ref 12.0–15.0)
Immature Granulocytes: 0 %
Lymphocytes Relative: 31 %
Lymphs Abs: 2.5 10*3/uL (ref 0.7–4.0)
MCH: 31.3 pg (ref 26.0–34.0)
MCHC: 34 g/dL (ref 30.0–36.0)
MCV: 92 fL (ref 80.0–100.0)
Monocytes Absolute: 0.6 10*3/uL (ref 0.1–1.0)
Monocytes Relative: 7 %
Neutro Abs: 4.8 10*3/uL (ref 1.7–7.7)
Neutrophils Relative %: 60 %
Platelets: 264 10*3/uL (ref 150–400)
RBC: 4.64 MIL/uL (ref 3.87–5.11)
RDW: 12.6 % (ref 11.5–15.5)
WBC: 8 10*3/uL (ref 4.0–10.5)
nRBC: 0 % (ref 0.0–0.2)

## 2022-02-16 LAB — BASIC METABOLIC PANEL
Anion gap: 7 (ref 5–15)
BUN: 12 mg/dL (ref 6–20)
CO2: 30 mmol/L (ref 22–32)
Calcium: 10.4 mg/dL — ABNORMAL HIGH (ref 8.9–10.3)
Chloride: 101 mmol/L (ref 98–111)
Creatinine, Ser: 0.77 mg/dL (ref 0.44–1.00)
GFR, Estimated: >60 mL/min (ref >60)
Glucose, Bld: 90 mg/dL (ref 70–99)
Potassium: 3.8 mmol/L (ref 3.5–5.1)
Sodium: 138 mmol/L (ref 135–145)

## 2022-02-16 MED ORDER — ACETAMINOPHEN 500 MG PO TABS
1000.0000 mg | ORAL_TABLET | Freq: Once | ORAL | Status: AC
  Administered 2022-02-16: 1000 mg via ORAL
  Filled 2022-02-16: qty 2

## 2022-02-16 MED ORDER — IOHEXOL 350 MG/ML SOLN
100.0000 mL | Freq: Once | INTRAVENOUS | Status: AC | PRN
  Administered 2022-02-16: 100 mL via INTRAVENOUS

## 2022-02-16 NOTE — ED Notes (Signed)
Patient transported to MRI 

## 2022-02-16 NOTE — ED Notes (Signed)
Patient verbalizes understanding of d/c instructions. Opportunities for questions and answers were provided. Pt d/c from ED and ambulated to lobby with husband.  

## 2022-02-16 NOTE — ED Provider Notes (Signed)
?  Provider Note ?MRN:  [id number]  ?Arrival date & time: 02/16/22    ?ED Course and Medical Decision Making  ?Assumed care from Dr. Bernette Mayers upon patient transfer. ? ?Headache with slurred speech, other neurological symptoms.  History of complex migraines but a bit different this time.  Sent here for MRI, recommended by neurology.  Patient with dull frontal headache at this time but neurological deficits/symptoms are no longer present. ? ?6:40 AM update: MRI is without acute findings.  Patient continues to feel well with normal neurological exam.  Appropriate for discharge. ? ?Procedures ? ?Final Clinical Impressions(s) / ED Diagnoses  ? ?  ICD-10-CM   ?1. Ophthalmoplegic migraine, not intractable  G43.B0   ?  ?  ?ED Discharge Orders   ? ? None  ? ?  ?  ?Discharge Instructions   ?None ?  ? ? ?Elmer Sow. Pilar Plate, MD ?[facility] Emergency Medicine ?[facility] [email or web address] ? ?  ?Sabas Sous, MD ?02/16/22 [phone] ? ?

## 2022-02-16 NOTE — ED Provider Notes (Signed)
? ?[facility] EMERGENCY DEPT  ?Provider Note ? ?CSN: [id number] ?Arrival date & time: 02/15/22 2205 ? ?History ?Chief Complaint  ?Patient presents with  ? Migraine  ? ? ?Cathy Vasquez is a 31 y.o. female with history of complex migraines has not had one in about 2 years. She was brought to the ED tonight by husband who supplements her history. She began having some visual changes and tingling in R hand earlier tonight that were consistent with previous migraines. She went to lie down in bed and about later husband was in bed talking to her and she began slurring some words which is not typical. She did not develop her typical migraine headache either but did have some mild frontal pressure. Her dysarthria lasted about and resolved. She is currently asymptomatic. She has a complex history of congenital heart defect, s/p multiple prior procedures for aortic stenosis as a child and ultimately had a Ross procedure in 2004. Apparently during one of her procedures they 'lost' her R carotid artery, this was confirmed on recent carotid doppler in Jul 2022. ? ? ?Home Medications ?Prior to Admission medications   ?Medication Sig Start Date End Date Taking? Authorizing Provider  ?cetirizine (ZYRTEC) 10 MG tablet Take 10 mg by mouth daily.    [provider]  ?clindamycin (CLEOCIN) 300 MG capsule Take 2 capsules 30-60 minutes before your appointment as needed ?Patient taking differently: Take 300 mg by mouth as needed. Take 2 capsules 30-60 minutes before dental appointment as needed 06/08/21   Willow Ora, MD  ?triamcinolone ointment (KENALOG) 0.1 % 1 APP ONCE A DAY AT BEDTIME as needed APPLIED TOPICALLY 10/20/19   Willow Ora, MD  ? ? ? ?Allergies    ?Other, Amoxicillin, Cefaclor, and Penicillin g ? ? ?Review of Systems   ?Review of Systems ?Please see HPI for pertinent positives and negatives ? ?Physical Exam ?BP (!) 124/94   Pulse 69   Temp 98.7 ?F (37.1 ?C)   Resp 18   Ht 5\' 1"   (1.549 m)   Wt 54.4 kg   LMP 01/22/2022 (Approximate)   SpO2 100%   BMI 22.67 kg/m?  ? ?Physical Exam ?Vitals and nursing note reviewed.  ?Constitutional:   ?   Appearance: Normal appearance.  ?HENT:  ?   Head: Normocephalic and atraumatic.  ?   Nose: Nose normal.  ?   Mouth/Throat:  ?   Mouth: Mucous membranes are moist.  ?Eyes:  ?   Extraocular Movements: Extraocular movements intact.  ?   Conjunctiva/sclera: Conjunctivae normal.  ?Cardiovascular:  ?   Rate and Rhythm: Normal rate.  ?Pulmonary:  ?   Effort: Pulmonary effort is normal.  ?   Breath sounds: Normal breath sounds.  ?Abdominal:  ?   General: Abdomen is flat.  ?   Palpations: Abdomen is soft.  ?   Tenderness: There is no abdominal tenderness.  ?Musculoskeletal:     ?   General: No swelling. Normal range of motion.  ?   Cervical back: Neck supple.  ?Skin: ?   General: Skin is warm and dry.  ?Neurological:  ?   General: No focal deficit present.  ?   Mental Status: She is alert and oriented to person, place, and time.  ?   Cranial Nerves: No cranial nerve deficit.  ?   Sensory: No sensory deficit.  ?   Motor: No weakness.  ?   Coordination: Coordination normal.  ?   Gait: Gait normal.  ?  Psychiatric:     ?   Mood and Affect: Mood normal.  ? ? ?ED Results / Procedures / Treatments   ?EKG ?EKG Interpretation ? ?Date/Time:  Saturday February 16 2022 00:55:22 EST ?Ventricular Rate:  70 ?PR Interval:  128 ?QRS Duration: 92 ?QT Interval:  398 ?QTC Calculation: 429 ?R Axis:   85 ?Text Interpretation: Normal sinus rhythm Septal infarct , age undetermined T wave abnormality, consider inferolateral ischemia Abnormal ECG No previous ECGs available Confirmed by Susy Frizzle [phone]) on 02/16/2022 2:40:02 AM ? ?Procedures ?Procedures ? ?Medications Ordered in the ED ?Medications  ?iohexol (OMNIPAQUE) 350 MG/ML injection 100 mL (100 mLs Intravenous Contrast Given 02/16/22 0331)  ? ? ?Initial Impression and Plan ? Patient with history of complex migraine here with some  features of same but also with short period of dysarthria which is unusual for her. Given her history of cardiac disease and lack of a R carotid artery, I spoke with Dr. Thomasena Edis, on call for Neurology who recommends a CTA and MRI to rule out a small stroke. Discussed this with the patient and husband at bedside, they are amenable but do not want to go by [facility]. After CTA is done here, they will drive to [facility] ED for MRI. Dr. Pilar Plate is aware.  ? ?ED Course  ? ?Clinical Course as of 02/16/22 0356  ?Sat Feb 16, 2022  ?0336 CBC and BMP are normal. I personally viewed the images from radiology studies and agree with radiologist interpretation: CT head without contrast is unremarkable ? [CS]  ?  ?Clinical Course User Index ?[CS] Pollyann Savoy, MD  ? ? ? ?MDM Rules/Calculators/A&P ?Medical Decision Making ?Problems Addressed: ?Ophthalmoplegic migraine, not intractable: acute illness or injury ? ?Amount and/or Complexity of Data Reviewed ?Labs: ordered. Decision-making details documented in ED Course. ?Radiology: ordered and independent interpretation performed. Decision-making details documented in ED Course. ?ECG/medicine tests: ordered and independent interpretation performed. Decision-making details documented in ED Course. ? ?Risk ?Prescription drug management. ?Decision regarding hospitalization. ? ? ? ?Final Clinical Impression(s) / ED Diagnoses ?Final diagnoses:  ?Ophthalmoplegic migraine, not intractable  ? ? ?Rx / DC Orders ?ED Discharge Orders   ? ? None  ? ?  ? ?  ?Pollyann Savoy, MD ?02/16/22 [phone] ? ?

## 2022-02-16 NOTE — Discharge Instructions (Signed)
You were evaluated in the Emergency Department and after careful evaluation, we did not find any emergent condition requiring admission or further testing in the hospital. ? ?Your exam/testing today was overall reassuring.  MRI does not show stroke or any other concerning findings.  Recommend follow-up with neurology for future management of migraines. ? ?Please return to the Emergency Department if you experience any worsening of your condition.  Thank you for allowing [place] to be a part of your care. ? ?

## 2022-02-18 ENCOUNTER — Telehealth: Payer: Self-pay | Admitting: Family Medicine

## 2022-02-18 NOTE — Telephone Encounter (Signed)
Pt states she was recently in the ED and had several CT scans. She states she was told she had sinusitis after her scans. She is wondering if she should be on antibiotics.  I advised pt to come in for an office visit. Pt stated she cannot come in this week and would like Dr Andy's advice first. Please advise ?

## 2022-02-20 ENCOUNTER — Ambulatory Visit: Payer: 59 | Admitting: Psychiatry

## 2022-02-20 ENCOUNTER — Encounter: Payer: Self-pay | Admitting: Psychiatry

## 2022-02-20 VITALS — BP 103/69 | HR 70 | Ht 61.0 in | Wt 121.2 lb

## 2022-02-20 DIAGNOSIS — Z139 Encounter for screening, unspecified: Secondary | ICD-10-CM | POA: Diagnosis not present

## 2022-02-20 DIAGNOSIS — G43119 Migraine with aura, intractable, without status migrainosus: Secondary | ICD-10-CM | POA: Diagnosis not present

## 2022-02-20 MED ORDER — NURTEC 75 MG PO TBDP: 75.0000 mg | ORAL_TABLET | ORAL | 0 refills | Status: DC | PRN

## 2022-02-20 MED ORDER — UBRELVY 100 MG PO TABS: 100.0000 mg | ORAL_TABLET | ORAL | 0 refills | Status: DC | PRN

## 2022-02-20 NOTE — Telephone Encounter (Signed)
Message sent to Pt thru MyChart from [place] ?

## 2022-02-20 NOTE — Progress Notes (Signed)
? ?Referring:  ?Maudie Flakes, MD ?[address] ?[place],  [place] [address] ? ?PCP: ?Leamon Arnt, MD ? ?Neurology was asked to evaluate Cathy Vasquez, a 31 year old female for a chief complaint of headaches.  Our recommendations of care will be communicated by shared medical record.   ? ?CC:  headaches ? ?HPI:  ?Medical co-morbidities: Absence of right ICA (from prior heart surgery), congenital heart disease, aortic aneurysm ? ?The patient presents for evaluation of headaches. She has a history of migraine with visual and sensory aura since childhood. She had a recent episode which was different from her prior migraines. On 02/16/22 she developed her typical visual aura which lasted for 30 minutes. This was followed by her typical sensory aura of numbness in right arm. She then developed issues with her language. She could understand what people were saying to her at the time, but had difficulty getting words out. She could say simple words and her husband's name, but could not say more complicated words like her grandmother's name, "medicine", or "sumatriptan". This lasted for 5-10 minutes. She then developed a frontal throbbing headache with photophobia. Notes some slowing of her cognition with the headache which lasted for 1-2 hours. Husband states she was very slow to respond at that time. Her headache lasted for the rest of the day. ? ?She presented to the ED where CTA and MRI were done. CTA showed an absence of her right common and internal carotid artery with distal reconstitution of the right carotid siphon (baseline since childhood). MRI showed a few remote microhemorrhages, possibly from her prior heart surgery. It also showed right maxillary sinusitis. There was no acute intracranial process. ? ?Prior to this her last migraine was several months ago. She has previously taken Imitrex which was helpful but caused side effects. Has also tried naratriptan which was ineffective. ? ?She follows with Cardiology  for congenital heart disease. Last TTE was in September 2022 which showed a normal ejection fraction. ? ?Headache History: ?Onset: 4th grade ?Triggers: menstrual cycle ?Aura: visual and sensory, language ?Location: frontal, temporal ?Quality/Description: throbbing ?Associated Symptoms: ? Photophobia: yes ? Phonophobia: yes ? Nausea: yes ?Vomiting: yes ?Worse with activity?: yes ?Duration of headaches: 1-2 days ? ?Headache days per month: 1 ?Headache free days per month: 29 ? ?Current Treatment: ?Abortive ?Imitrex ? ?Preventative ?none ? ?Prior Therapies                                 ?Imitrex - side effects ?Naratriptan - lack of efficacy ? ?LABS: ?CBC ?   ?Component Value Date/Time  ? WBC 8.0 02/15/2022 0049  ? RBC 4.64 02/15/2022 0049  ? HGB 14.5 02/15/2022 0049  ? HGB 13.8 07/27/2021 1211  ? HCT 42.7 02/15/2022 0049  ? HCT 42.4 07/27/2021 1211  ? PLT 264 02/15/2022 0049  ? PLT 249 07/27/2021 1211  ? MCV 92.0 02/15/2022 0049  ? MCV 96 07/27/2021 1211  ? MCH 31.3 02/15/2022 0049  ? MCHC 34.0 02/15/2022 0049  ? RDW 12.6 02/15/2022 0049  ? RDW 12.6 07/27/2021 1211  ? LYMPHSABS 2.5 02/15/2022 0049  ? MONOABS 0.6 02/15/2022 0049  ? EOSABS 0.1 02/15/2022 0049  ? BASOSABS 0.1 02/15/2022 0049  ? ?CMP Latest Ref Rng & Units 02/15/2022 12/12/2021 09/04/2020  ?Glucose 70 - 99 mg/dL 90 80 77  ?BUN 6 - 20 mg/dL 12 9 7   ?Creatinine 0.44 - 1.00 mg/dL 0.77  0.71 0.75  ?Sodium 135 - 145 mmol/L 138 138 139  ?Potassium 3.5 - 5.1 mmol/L 3.8 4.5 4.5  ?Chloride 98 - 111 mmol/L 101 102 104  ?CO2 22 - 32 mmol/L 30 29 28   ?Calcium 8.9 - 10.3 mg/dL 10.4(H) 9.8 9.9  ?Total Protein 6.0 - 8.3 g/dL - 7.3 7.1  ?Total Bilirubin 0.2 - 1.2 mg/dL - 0.6 0.4  ?Alkaline Phos 39 - 117 U/L - 34(L) -  ?AST 0 - 37 U/L - 17 14  ?ALT 0 - 35 U/L - 13 8  ? ?Lipid Panel  ?   ?Component Value Date/Time  ? CHOL 200 12/12/2021 0941  ? TRIG 87.0 12/12/2021 0941  ? HDL 49.30 12/12/2021 0941  ? CHOLHDL 4 12/12/2021 0941  ? VLDL 17.4 12/12/2021 0941  ? [place] 133 (H)  12/12/2021 0941  ? LDLCALC 113 (H) 11/14/2020 1133  ? ? ? ?IMAGING:  ?MRI brain 02/16/22: ?1. Focal right maxillary sinusitis.  No acute intracranial finding. ?2. Few remote microhemorrhages. ?3. Congenital absence of the right ICA. ? ?CTA head/neck 02/16/22: ?1. Negative CTA for large vessel occlusion or other emergent ?finding. ?2. Congenital absence of the right common and internal carotid ?arteries. Distal reconstitution of the right carotid siphon as ?above. ?3. Otherwise unremarkable and normal CTA of the head and neck. No ?significant atheromatous disease or stenosis. No aneurysm. ?4. Chronic right maxillary sinusitis. ?  ? ?Imaging independently reviewed on February 20, 2022  ? ?Current Outpatient Medications on File Prior to Visit  ?Medication Sig Dispense Refill  ? cetirizine (ZYRTEC) 10 MG tablet Take 10 mg by mouth daily.    ? triamcinolone ointment (KENALOG) 0.1 % 1 APP ONCE A DAY AT BEDTIME as needed APPLIED TOPICALLY 80 g 0  ? clindamycin (CLEOCIN) 300 MG capsule Take 2 capsules 30-60 minutes before your appointment as needed (Patient not taking: Reported on 02/20/2022) 6 capsule 1  ? ?No current facility-administered medications on file prior to visit.  ? ? ? ?Allergies: ?Allergies  ?Allergen Reactions  ? Other Anaphylaxis  ?  Tree Nuts  ? Amoxicillin Rash  ? Cefaclor Hives and Rash  ? Penicillin G Rash  ? ? ?Family History: ?Migraine or other headaches in the family:  mother has migraine with aura, sister, maternal aunt ?Aneurysms in a first degree relative:  no ?Brain tumors in the family:  sister has an osteoma ?Other neurological illness in the family:   none ? ?Past Medical History: ?Past Medical History:  ?Diagnosis Date  ? Aortic aneurysm ([place])   ? Aortic stenosis   ? Atopic eczema 10/15/2018  ? Gastroesophageal reflux disease without esophagitis 12/29/2015  ? - discussed lifestyle changes - prescribed ranitidine 12/29/15 Rx Zantac 150 mg # 60 BID with refills x 5 given(01/19/16)  ? Heart disease   ?  Heart murmur   ? Migraines   ? ? ?Past Surgical History ?Past Surgical History:  ?Procedure Laterality Date  ? CARDIAC SURGERY  05/17/2003  ? Post Ross Procedure  ? ROSS KONNO PROCEDURE    ? VENOUS ABLATION Right 2020  ? varicose veins/venous insufficiency  ? WISDOM TOOTH EXTRACTION    ? ? ?Social History: ?Social History  ? ?Tobacco Use  ? Smoking status: Never  ? Smokeless tobacco: Never  ?Vaping Use  ? Vaping Use: Never used  ?Substance Use Topics  ? Alcohol use: Not Currently  ? Drug use: Not Currently  ? ? ?ROS: ?Negative for fevers, chills. Positive for headaches, vision changes, numbness. All  other systems reviewed and negative unless stated otherwise in HPI. ? ? ?Physical Exam:  ? ?Vital Signs: ?BP 103/69   Pulse 70   Ht 5\' 1"  (1.549 m)   Wt 121 lb 3.2 oz (55 kg)   LMP 01/22/2022 (Approximate)   BMI 22.90 kg/m?  ?GENERAL: well appearing,in no acute distress,alert ?SKIN:  Color, texture, turgor normal. No rashes or lesions ?HEAD:  Normocephalic/atraumatic. ?CV:  RRR ?RESP: Normal respiratory effort ?MSK: no tenderness to palpation over occiput, neck, or shoulders ? ?NEUROLOGICAL: ?Mental Status: Alert, oriented to person, place and time,Follows commands ?Cranial Nerves: PERRL, visual fields intact to confrontation, extraocular movements intact, facial sensation intact, no facial droop or ptosis, hearing grossly intact, no dysarthria ?Motor: muscle strength 5/5 both upper and lower extremities,no drift, normal tone ?Reflexes: 2+ throughout ?Sensation: intact to light touch all 4 extremities ?Coordination: Finger-to- nose-finger intact bilaterally ?Gait: normal-based ? ? ?IMPRESSION: ?31 year old female with a history of Absence of right ICA (from prior heart surgery), congenital heart disease, aortic aneurysm who presents for evaluation of language difficulty associated with migraine. Her recent episode likely represents a language aura, as it was preceded by her typical visual and sensory aura. Stroke  workup including MRI and CTA were negative for acute process, and her recent TTE is stable. LDL is elevated, counseled her on managing this with diet and exercise. Will check A1c to complete workup of vascular

## 2022-02-20 NOTE — Patient Instructions (Addendum)
Migraines can be preceded by neurological symptoms called auras. Auras can have many different symptoms including vision changes (zigzags, kaleidoscope vision, flashing lights, blurred vision), numbness and tingling (usually on once side of the hand or face), difficulty with language (reading or speaking), and weakness on one side. Many times these symptoms will occur before a migraine headache, but auras can occur without a headache as well. Auras can naturally change over time and may convert from one type to another (people with visual aura may eventually develop a sensory aura, etc). ? ?Plan: ? ?Try Nurtec as needed for migraine. Take one pill at the first sign of migraine. Can repeat a dose in 24 hours if headache persists ?Try Bernita Raisin as needed for migraine. Take one pill at the first sign of migraine, can repeat a dose in 2 hours if headache persists. ? ?Only try one of these medications at a time (do not use both in the same day). Let me know if one of these medications works well for you and I will send in a prescription. ? ?Natural supplements that can reduce migraines: ?Magnesium Oxide or Magnesium Glycinate 500 mg at bed (up to 800 mg daily) ?Coenzyme Q10 300 mg in AM ?Vitamin B2- 200 mg twice a day ? ?Add 1 supplement at a time since even natural supplements can have undesirable side effects. You can sometimes buy supplements cheaper (especially Coenzyme Q10) at www.WebmailGuide.co.za or at [employer]. ? ?Magnesium: Magnesium (250 mg twice a day or 500 mg at bed) has a relaxant effect on smooth muscles such as blood vessels. Individuals suffering from frequent or daily headache usually have low magnesium levels which can be increase with daily supplementation of 400-800 mg. Three trials found 40-90% average headache reduction  when used as a preventative. Magnesium also demonstrated the benefit in menstrually related migraine.  Magnesium is part of the messenger system in the serotonin cascade and it is a good  muscle relaxant.  It is also useful for constipation. Good sources include nuts, whole grains, and tomatoes. Side Effects: loose stool/diarrhea ? ?Riboflavin (vitamin B 2) 200 mg twice a day. This vitamin assists nerve cells in the production of ATP a principal energy storing molecule.  It is necessary for many chemical reactions in the body.  There have been at least 3 clinical trials of riboflavin using 400 mg per day all of which suggested that migraine frequency can be decreased.  All 3 trials showed significant improvement in over half of migraine sufferers.  The supplement is found in bread, cereal, milk, meat, and poultry.  Most Americans get more riboflavin than the recommended daily allowance, however riboflavin deficiency is not necessary for the supplements to help prevent headache. Side effects: energizing, green urine ? ?Coenzyme Q10: This is present in almost all cells in the body and is critical component for the conversion of energy.  Recent studies have shown that a nutritional supplement of CoQ10 can reduce the frequency of migraine attacks by improving the energy production of cells as with riboflavin.  Doses of 150 mg twice a day have been shown to be effective. ? ?

## 2022-02-21 LAB — HEMOGLOBIN A1C
Est. average glucose Bld gHb Est-mCnc: 103 mg/dL
Hgb A1c MFr Bld: 5.2 % (ref 4.8–5.6)

## 2022-02-21 MED ORDER — DOXYCYCLINE HYCLATE 100 MG PO TABS: 100.0000 mg | ORAL_TABLET | Freq: Two times a day (BID) | ORAL | 0 refills | Status: AC

## 2022-06-12 ENCOUNTER — Telehealth: Payer: Self-pay | Admitting: Cardiovascular Disease

## 2022-06-12 ENCOUNTER — Other Ambulatory Visit: Payer: Self-pay

## 2022-06-12 DIAGNOSIS — R9439 Abnormal result of other cardiovascular function study: Secondary | ICD-10-CM

## 2022-06-12 DIAGNOSIS — R7989 Other specified abnormal findings of blood chemistry: Secondary | ICD-10-CM | POA: Insufficient documentation

## 2022-06-12 DIAGNOSIS — Z954 Presence of other heart-valve replacement: Secondary | ICD-10-CM

## 2022-06-12 DIAGNOSIS — I340 Nonrheumatic mitral (valve) insufficiency: Secondary | ICD-10-CM

## 2022-06-12 NOTE — Telephone Encounter (Signed)
Patient would like to know if she needs an echo before her appointment with Dr. Eden Emms in August.

## 2022-06-12 NOTE — Telephone Encounter (Signed)
Pt is requesting a call back to discuss the test that was scheduled. She states there could be conflict with this and past appts where she had test done. She states she has questions and would like advice. She would also like to discuss possibly scheduling an echo. Please advise.

## 2022-06-12 NOTE — Telephone Encounter (Signed)
Called patient back to let her know order for echo is in and someone will call her to schedule.

## 2022-06-26 ENCOUNTER — Ambulatory Visit (HOSPITAL_COMMUNITY)
Admission: RE | Admit: 2022-06-26 | Discharge: 2022-06-26 | Disposition: A | Discharge status: Home / Self Care | Payer: 59 | Source: Ambulatory Visit | Attending: Cardiovascular Disease | Admitting: Cardiovascular Disease

## 2022-06-26 DIAGNOSIS — I340 Nonrheumatic mitral (valve) insufficiency: Secondary | ICD-10-CM | POA: Insufficient documentation

## 2022-06-26 DIAGNOSIS — Z954 Presence of other heart-valve replacement: Secondary | ICD-10-CM | POA: Diagnosis not present

## 2022-06-26 LAB — ECHOCARDIOGRAM COMPLETE
AR max vel: 4.7 cm2
AV Area VTI: 4.21 cm2
AV Area mean vel: 5.08 cm2
AV Mean grad: 1.3 mmHg
AV Peak grad: 2.7 mmHg
Ao pk vel: 0.83 m/s
Area-P 1/2: 4.19 cm2
MV M vel: 5.02 m/s
MV Peak grad: 100.8 mmHg
Radius: 0.5 cm
S' Lateral: 3.3 cm

## 2022-06-26 NOTE — Progress Notes (Signed)
*  PRELIMINARY RESULTS* Echocardiogram 2D Echocardiogram has been performed.  Stacey Drain 06/26/2022, 11:35 AM

## 2022-07-03 ENCOUNTER — Encounter: Payer: Self-pay | Admitting: Family Medicine

## 2022-07-04 ENCOUNTER — Ambulatory Visit (HOSPITAL_COMMUNITY)
Admission: RE | Admit: 2022-07-04 | Discharge: 2022-07-04 | Disposition: A | Discharge status: Home / Self Care | Payer: 59 | Source: Ambulatory Visit | Attending: Cardiovascular Disease | Admitting: Cardiovascular Disease

## 2022-07-04 DIAGNOSIS — I6521 Occlusion and stenosis of right carotid artery: Secondary | ICD-10-CM | POA: Insufficient documentation

## 2022-07-04 DIAGNOSIS — R9439 Abnormal result of other cardiovascular function study: Secondary | ICD-10-CM | POA: Insufficient documentation

## 2022-07-04 DIAGNOSIS — Q249 Congenital malformation of heart, unspecified: Secondary | ICD-10-CM | POA: Insufficient documentation

## 2022-07-09 IMAGING — US US CAROTID DUPLEX BILAT
1 series · 13 of 24 positions shown · non-contrast
Comparison: None.

CLINICAL DATA: Unable to palpate right carotid artery. History of
congenital heart disease in aortic valve stenosis. Prior history of
right common carotid artery clamping.

EXAM:
BILATERAL CAROTID DUPLEX ULTRASOUND
TECHNIQUE: Gray scale imaging, color Doppler and duplex ultrasound were
performed of bilateral carotid and vertebral arteries in the neck.

[Series 1: us carotid bilateral · 13 of 50 slices shown]
[im 1/50]
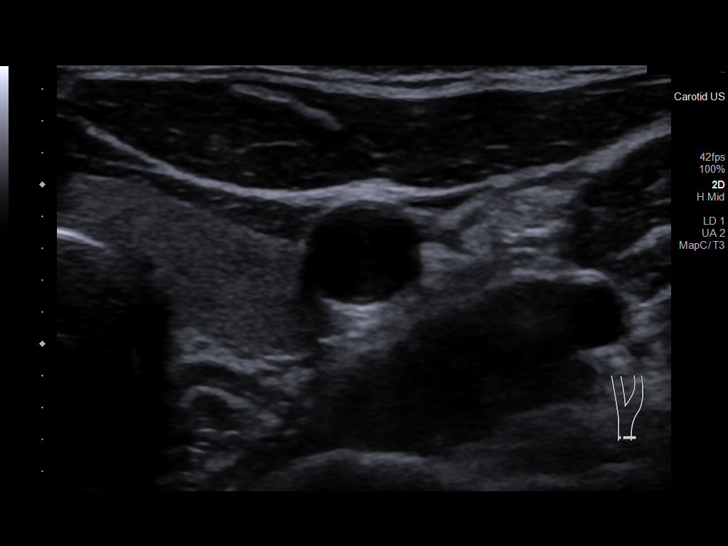
[im 5/50]
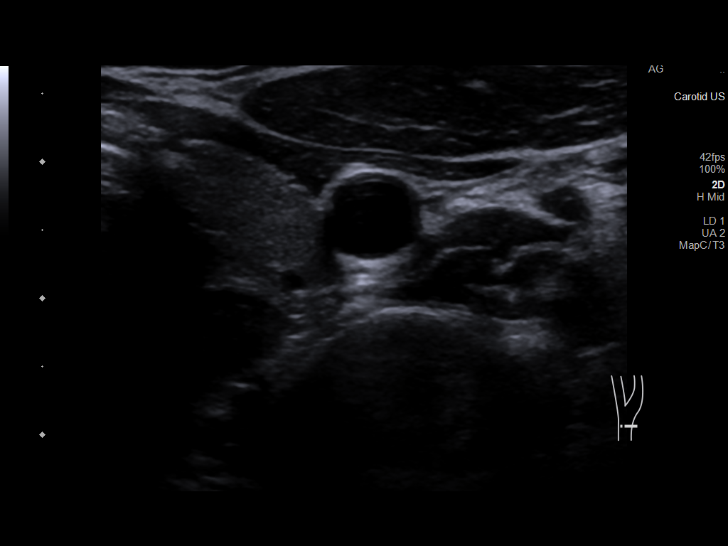
[im 9/50]
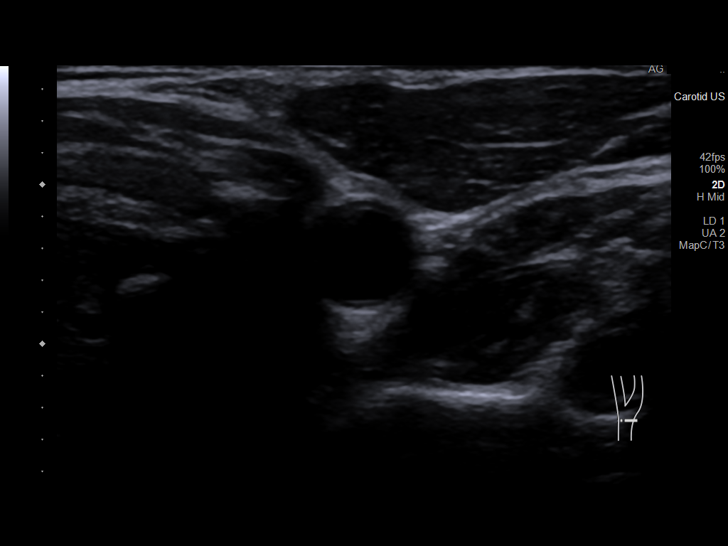
[im 13/50]
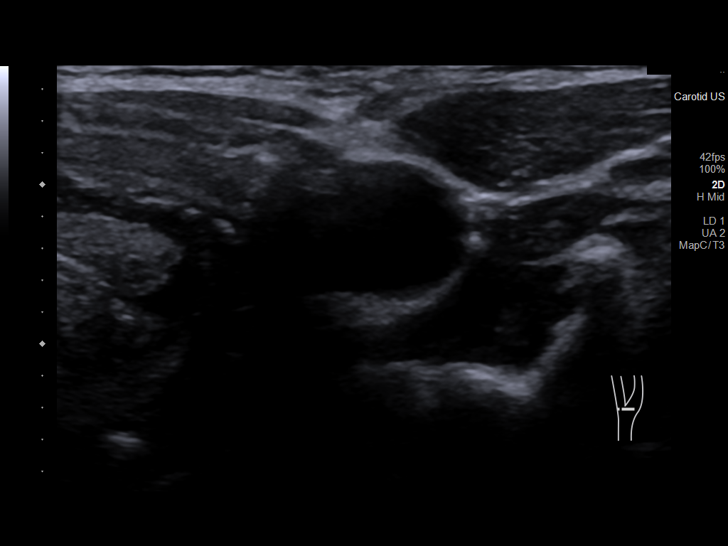
[im 18/50]
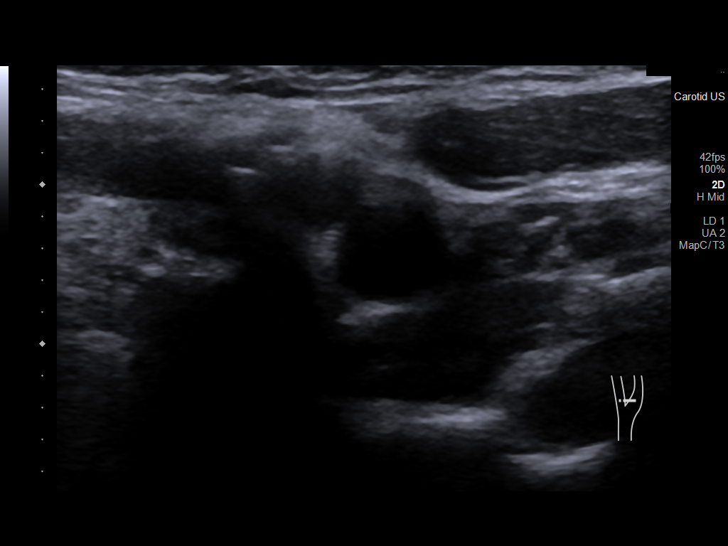
[im 22/50]
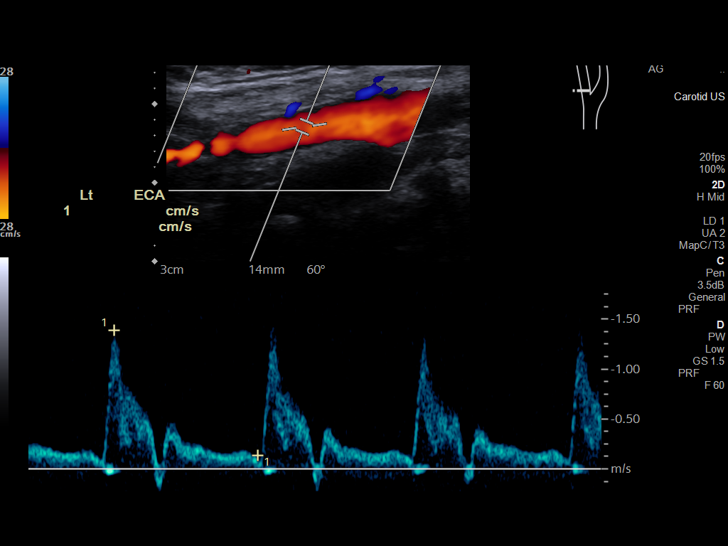
[im 26/50]
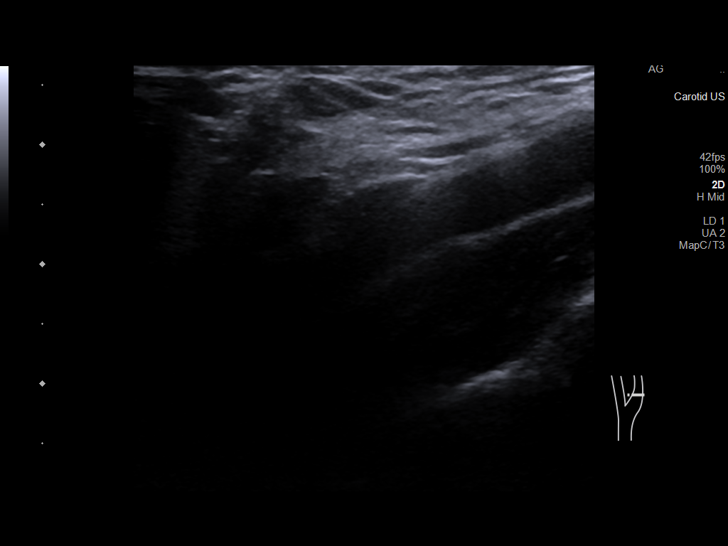
[im 28/50]
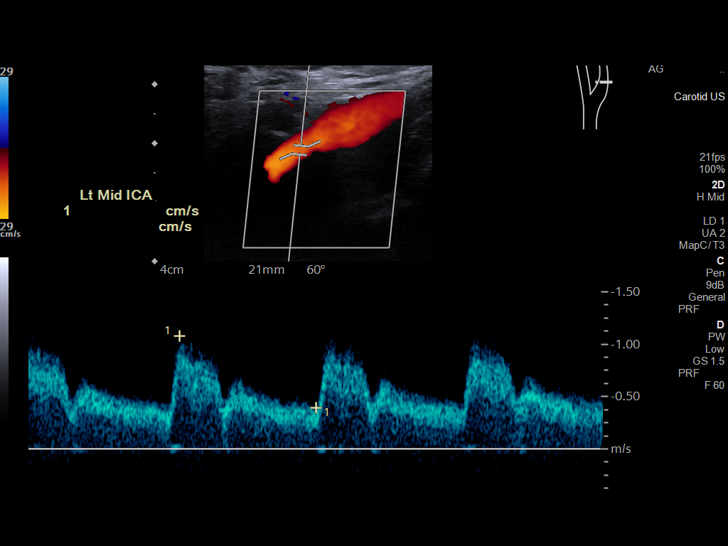
[im 32/50]
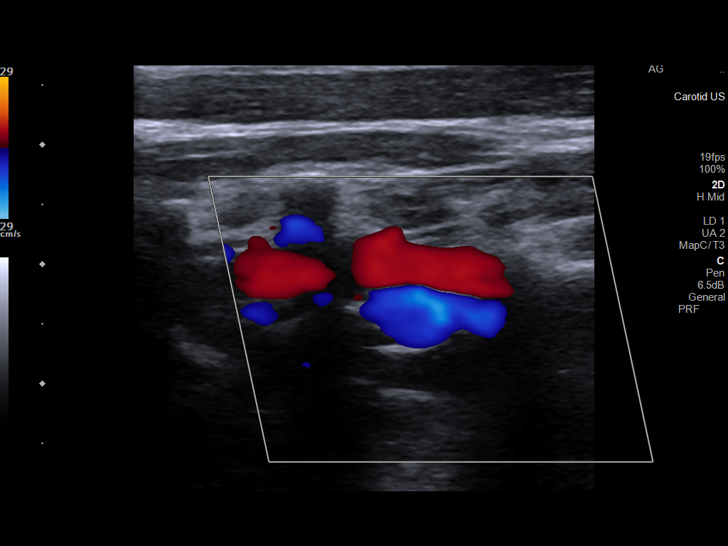
[im 37/50]
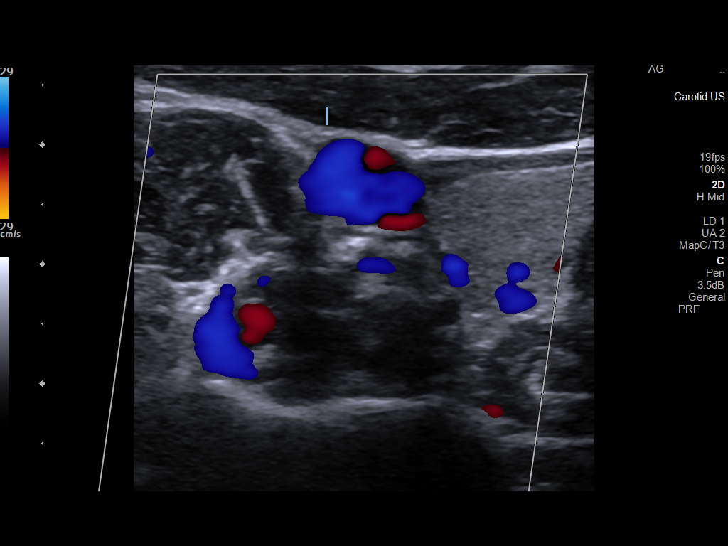
[im 41/50]
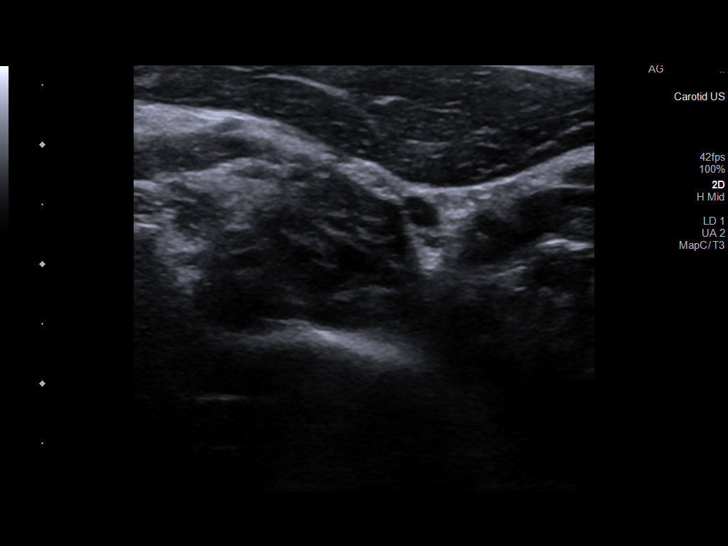
[im 45/50]
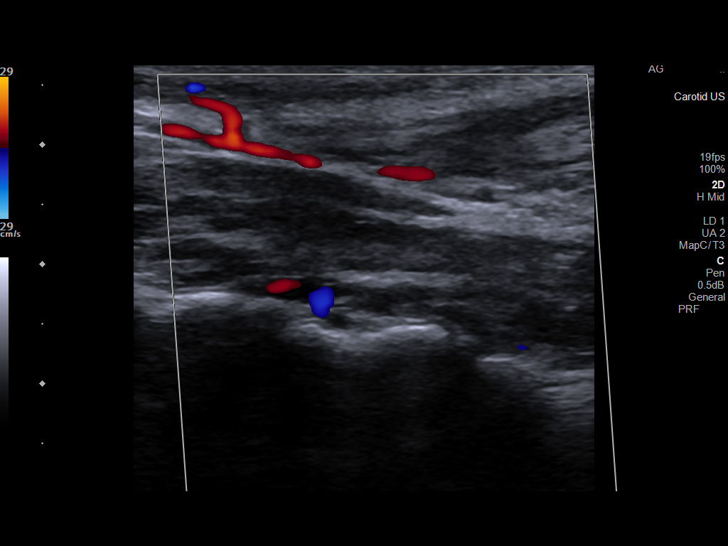
[im 50/50]
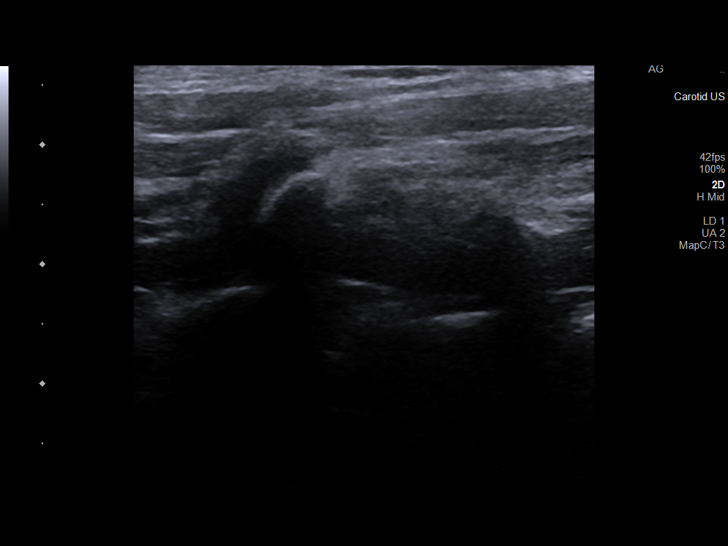

[13 of 24 positions shown; findings below may reference images not displayed]

FINDINGS: Criteria: Quantification of carotid stenosis is based on velocity
parameters that correlate the residual internal carotid diameter
with NASCET-based stenosis levels, using the diameter of the distal
internal carotid lumen as the denominator for stenosis measurement.

The following velocity measurements were obtained:

Right common and internal carotid arteries not identified.

LEFT

ICA: 108/39 cm/sec

CCA: 130/25 cm/sec

SYSTOLIC ICA/CCA RATIO:

ECA: 139 cm/sec

LEFT CAROTID ARTERY:  No significant atheromatous plaque.

LEFT VERTEBRAL ARTERY:  Antegrade flow.

RIGHT VERTEBRAL ARTERY:  Antegrade flow.
IMPRESSION: 1. No significant abnormality of the left common or internal carotid
artery.
2. No right common or internal carotid artery visualized.
3. Bilateral vertebral arteries are patent with antegrade flow.

## 2022-07-09 MED ORDER — CLINDAMYCIN HCL 300 MG PO CAPS: ORAL_CAPSULE | ORAL | 1 refills | Status: DC

## 2022-07-24 ENCOUNTER — Encounter: Payer: Self-pay | Admitting: Family Medicine

## 2022-07-24 ENCOUNTER — Ambulatory Visit (INDEPENDENT_AMBULATORY_CARE_PROVIDER_SITE_OTHER): Payer: 59 | Admitting: Family Medicine

## 2022-07-24 VITALS — BP 122/62 | HR 68 | Temp 98.1°F | Ht 61.0 in | Wt 121.8 lb

## 2022-07-24 DIAGNOSIS — R202 Paresthesia of skin: Secondary | ICD-10-CM | POA: Diagnosis not present

## 2022-07-24 NOTE — Patient Instructions (Addendum)
Paresthesias noted in the left lower leg. With no swelling or calf tenderness likelihood of clot in the leg very very low- opted to hold off on scan. Since getting slightly better opted to monitor over the coming week- if worsens or fails to continue to improve over next 1-2 weeks please return to see [place] or if other new symptoms like weakness in the leg or incontinence or back pain.   Recommended follow up: Return for as needed for new, worsening, persistent symptoms.

## 2022-07-24 NOTE — Progress Notes (Signed)
Phone [phone] In person visit   Subjective:   Cathy Vasquez is a 31 y.o. year old very pleasant female patient who presents for/with See problem oriented charting Chief Complaint  Patient presents with   left heel pain    Pt c/o left heel pain that radiates to the ankle started Sunday and she feels it when she steps or does walking motion with a tingly sensation.   Past Medical History-  Patient Active Problem List   Diagnosis Date Noted   Positive D dimer 06/12/2022   Migraine, ophthalmoplegic 11/09/2019   Migraine with aura    Atopic eczema 10/15/2018   Dyshidrotic eczema 10/15/2018   Venous insufficiency (chronic) (peripheral) 07/07/2018   Congenital aortic stenosis 11/07/2017   Non-rheumatic mitral regurgitation 03/31/2017   MVP (mitral valve prolapse) 03/31/2017   Ascending aorta dilatation (HCC) 03/28/2017   H/O Ross procedure 08/25/2016   Varicose veins of right leg with edema 07/25/2016    Medications- reviewed and updated Current Outpatient Medications  Medication Sig Dispense Refill   cetirizine (ZYRTEC) 10 MG tablet Take 10 mg by mouth daily.     clindamycin (CLEOCIN) 300 MG capsule Take 2 capsules 30-60 minutes before your appointment as needed 10 capsule 1   Rimegepant Sulfate (NURTEC) 75 MG TBDP Take 75 mg by mouth as needed. 1 tablet 0   triamcinolone ointment (KENALOG) 0.1 % 1 APP ONCE A DAY AT BEDTIME as needed APPLIED TOPICALLY 80 g 0   Ubrogepant (UBRELVY) 100 MG TABS Take 100 mg by mouth as needed. 2 tablet 0   No current facility-administered medications for this visit.     Objective:  BP 122/62   Pulse 68   Temp 98.1 F (36.7 C)   Ht 5\' 1"  (1.549 m)   Wt 121 lb 12.8 oz (55.2 kg)   SpO2 99%   BMI 23.01 kg/m  Gen: NAD, resting comfortably CV: RRR no murmurs rubs or gallops Lungs: CTAB no crackles, wheeze, rhonchi Ext: no edema, 2+ PT and DP pulses Skin: warm, dry Neuro: 5 out of 5 strength in lower extremities bilaterally.  Intact  monofilament sensation.  No pain with palpation over foot, ankle, lower leg.  With tapping on anterior shin patient noted recurrence of vibratory sensation in her heel as well as with tapping being palpation on the foot.  No pain on examination    Assessment and Plan   # Left heel paresthesias S:patient with left heel vibration/paresthesias/tingling starting Sunday night after laying down and going to stand back up- noted immediately on left heel. Consistent over next few days. This morning was slightly better when first got up- up for 30 minutes and then started again. No fall or injury. Not a runner and no increased physical activity recently.   Radiates to ankle at times. Notes pain when takes steps. Sometimes gets a tingly sensation . No fever. Did travel to [place]  - didn't cross legs, got out frequently but was 3 hour drive- intentional due to history of superficial thrombophlebitis on right leg in the past- had swelling/pain with that.   Aortic stenosis as a child- tried to go in through carotid- had to tie off the vessel and has been told no carotid. MRI of brain for complicated migraine 02/16/22- few remote microhemorrhages notes- thought trauma related to prior issue.   No back pain. No consistent pain in legs or buttocks- had some short lived pain for a few minutes yesterday for a few minutes. If taps on her quadricep  she will note tingling into the heel radiating down the back of the left leg.  A/P: Left heel paresthesias as well as vibratory sensation with palpation of the left lower leg and foot that improves with rest/avoiding physical contact.  Patient's main concern was blood clot given history of superficial thrombophlebitis on the right leg in the past-no calf pain or swelling and discussed typically paresthesias/motor sensation would not be related to DVT-for now she is okay to monitor.  We discussed could be a nerve or nerve root irritation but since symptoms overall rather mild  and improved and we opted to monitor over the next week to 2 and if not improving (or sooner if worsens) at that point she agrees to follow-up with her primary care physician Dr. Mardelle Matte for further evaluation.  -Also excellent pulses doubt vascular issue -Distant on the differential would be something like MS but I think that is unlikely-start with paresthesia work-up if fails to improve  Recommended follow up: Return for as needed for new, worsening, persistent symptoms. Future Appointments  Date Time Provider Department Center  08/07/2022  8:30 AM Wendall Stade, MD [facility]  12/13/2022  9:00 AM Willow Ora, MD [facility]  02/24/2023  9:00 AM Ocie Doyne, MD [facility] None    Lab/Order associations:   ICD-10-CM   1. Paresthesia of left foot  R20.2       Time Spent: 20 minutes of total time (10:20 AM-10:40 AM) was spent on the date of the encounter performing the following actions: chart review prior to seeing the patient, obtaining history, performing a medically necessary exam, counseling on the potential causes of symptoms as well as follow-up planning/return precautions, and documenting in our EHR.    Return precautions advised.  Tana Conch, MD

## 2022-07-30 NOTE — Progress Notes (Signed)
CARDIOLOGY CONSULT NOTE       Patient ID: Cathy Vasquez MRN: [id number] DOB/AGE: 1991/10/05 31 y.o.  Referring Physician: Earma Reading  Primary Physician: Willow Ora, MD Primary Cardiologist: Eden Emms Reason for Consultation: Congenital heart disease    HPI:  31 y.o. referred by Dr Logan Bores for congenital heart disease. She had aortic stenosis and underwent Ross Procedure in 2004.  She had two successful pregnancies in 2014, and 2017.  F/U TTE showed moderate MR likely with some MVP. Prior to [place] procedure she had 3 balloon valvuloplasties   TTE 11/15/19 EF 55-60% Normal RV Mild AR, mild-moderate MR estimated PA 37 mmHg mild PR MRA 09/13/19 aortic sinus 4.3 cm , ascending aorta 3.5 cm descending aorta 1.6 cm  Noted fatigue with lopressor changed to bisoprolol 2.5 mg Still with fatigue and low BP  She initially had ? Atresia of AV She had first very young age 62 and last age 65 all at [facility] She describes having " lost an artery / vein in right neck during on procedure Duplex 07/04/22 showed occluded right common carotid   Some RLE dependant edema Using Cleosin for SBE  TTE 06/26/22 EF 50-55% mild MR mild AR Aortic root 45 mm mild PR and no PS  MRI 09/05/21 sinus only 3.9 cm normal EF   Home schooling 5/9 yo children   ROS All other systems reviewed and negative except as noted above  Past Medical History:  Diagnosis Date   Aortic aneurysm (HCC)    Aortic stenosis    Atopic eczema 10/15/2018   Gastroesophageal reflux disease without esophagitis 12/29/2015   - discussed lifestyle changes - prescribed ranitidine 12/29/15 Rx Zantac 150 mg # 60 BID with refills x 5 given(01/19/16)   Heart disease    Heart murmur    Migraines     Family History  Problem Relation Age of Onset   Hypertension Mother    Hypertension Father    Healthy Sister    Healthy Sister    Heart disease Maternal Grandmother    Diabetes Paternal Actor    Healthy Daughter    Healthy Son    Migraines  Maternal Aunt     Social History   Socioeconomic History   Marital status: Married    Spouse name: Gerri Spore   Number of children: 2   Years of education: Not on file   Highest education level: Some college, no degree  Occupational History   Occupation: stay at home  Tobacco Use   Smoking status: Never   Smokeless tobacco: Never  Vaping Use   Vaping Use: Never used  Substance and Sexual Activity   Alcohol use: Not Currently   Drug use: Not Currently   Sexual activity: Yes    Partners: Male    Birth control/protection: Other-see comments    Comment: Vasectomy   Other Topics Concern   Not on file  Social History Narrative   Lives with family   Caffeine- coffee 1 c, 1 soda or tea   Social Determinants of Health   Financial Resource Strain: Not on file  Food Insecurity: Not on file  Transportation Needs: Not on file  Physical Activity: Not on file  Stress: Not on file  Social Connections: Not on file  Intimate Partner Violence: Not on file    Past Surgical History:  Procedure Laterality Date   CARDIAC SURGERY  05/17/2003   Post Ross Procedure   ROSS [facility] PROCEDURE     VENOUS ABLATION Right 2020  varicose veins/venous insufficiency   WISDOM TOOTH EXTRACTION        Current Outpatient Medications:    cetirizine (ZYRTEC) 10 MG tablet, Take 10 mg by mouth daily., Disp: , Rfl:    clindamycin (CLEOCIN) 300 MG capsule, Take 2 capsules 30-60 minutes before your appointment as needed, Disp: 10 capsule, Rfl: 1   Rimegepant Sulfate (NURTEC) 75 MG TBDP, Take 75 mg by mouth as needed., Disp: 1 tablet, Rfl: 0   triamcinolone ointment (KENALOG) 0.1 %, 1 APP ONCE A DAY AT BEDTIME as needed APPLIED TOPICALLY, Disp: 80 g, Rfl: 0   Ubrogepant (UBRELVY) 100 MG TABS, Take 100 mg by mouth as needed., Disp: 2 tablet, Rfl: 0    Physical Exam: Blood pressure 106/78, pulse 88, height 5\' 1"  (1.549 m), weight 119 lb (54 kg), SpO2 99 %.   Affect appropriate Healthy:  appears stated  age HEENT: normal Neck supple with no adenopathy JVP normal no bruits no thyromegaly Lungs clear with no wheezing and good diaphragmatic motion Heart:  S1/S2 PR murmur LUSB MR murmur apex  no rub, gallop or click PMI normal post sternotomy  Abdomen: benighn, BS positve, no tenderness, no AAA no bruit.  No HSM or HJR Distal pulses intact with no bruits No edema Neuro non-focal Skin warm and dry No muscular weakness   Labs:   Lab Results  Component Value Date   WBC 8.0 02/15/2022   HGB 14.5 02/15/2022   HCT 42.7 02/15/2022   MCV 92.0 02/15/2022   PLT 264 02/15/2022   No results for input(s): "NA", "K", "CL", "CO2", "BUN", "CREATININE", "CALCIUM", "PROT", "BILITOT", "ALKPHOS", "ALT", "AST", "GLUCOSE" in the last 168 hours.  Invalid input(s): "LABALBU" No results found for: "CKTOTAL", "CKMB", "CKMBINDEX", "TROPONINI"  Lab Results  Component Value Date   CHOL 200 12/12/2021   CHOL 184 11/14/2020   CHOL 155 10/20/2019   Lab Results  Component Value Date   HDL 49.30 12/12/2021   HDL 50 11/14/2020   HDL 45.80 10/20/2019   Lab Results  Component Value Date   LDLCALC 133 (H) 12/12/2021   LDLCALC 113 (H) 11/14/2020   LDLCALC 95 10/20/2019   Lab Results  Component Value Date   TRIG 87.0 12/12/2021   TRIG 106 11/14/2020   TRIG 71.0 10/20/2019   Lab Results  Component Value Date   CHOLHDL 4 12/12/2021   CHOLHDL 3.7 11/14/2020   CHOLHDL 3 10/20/2019   No results found for: "LDLDIRECT"    Radiology: No results found.  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 Pulmonary homograft mild PR/ no PS  MR:  ? Parachute mild MR on TTE 06/26/22  Carotid: occluded right f/u duplex July 2025 to follow left  Palpitations : benign monitor no significant arrhythmia / PAF Aorta:  root dilatation Sinus only 3.9 cm on MRI 09/05/21    F/U in a year   Signed: 09/07/21 08/07/2022, 8:43 AM

## 2022-08-07 ENCOUNTER — Encounter: Payer: Self-pay | Admitting: Cardiovascular Disease

## 2022-08-07 ENCOUNTER — Encounter: Payer: Self-pay | Admitting: Family Medicine

## 2022-08-07 ENCOUNTER — Ambulatory Visit: Payer: 59 | Attending: Cardiovascular Disease | Admitting: Cardiovascular Disease

## 2022-08-07 VITALS — BP 106/78 | HR 88 | Ht 61.0 in | Wt 119.0 lb

## 2022-08-07 DIAGNOSIS — I340 Nonrheumatic mitral (valve) insufficiency: Secondary | ICD-10-CM | POA: Diagnosis not present

## 2022-08-07 DIAGNOSIS — R002 Palpitations: Secondary | ICD-10-CM | POA: Diagnosis not present

## 2022-08-07 DIAGNOSIS — Z954 Presence of other heart-valve replacement: Secondary | ICD-10-CM | POA: Diagnosis not present

## 2022-08-07 DIAGNOSIS — I6521 Occlusion and stenosis of right carotid artery: Secondary | ICD-10-CM | POA: Insufficient documentation

## 2022-08-07 NOTE — Patient Instructions (Signed)
Medication Instructions:  Your physician recommends that you continue on your current medications as directed. Please refer to the Current Medication list given to you today.  *If you need a refill on your cardiac medications before your next appointment, please call your pharmacy*  Lab Work: If you have labs (blood work) drawn today and your tests are completely normal, you will receive your results only by: MyChart Message (if you have MyChart) OR A paper copy in the mail If you have any lab test that is abnormal or we need to change your treatment, we will call you to review the results.  Testing/Procedures: None ordered today.  Follow-Up: At  [facility], you and your health needs are our priority.  As part of our continuing mission to provide you with exceptional heart care, we have created designated Provider Care Teams.  These Care Teams include your primary Cardiologist (physician) and Advanced Practice Providers (APPs -  Physician Assistants and Nurse Practitioners) who all work together to provide you with the care you need, when you need it.  We recommend signing up for the patient portal called "MyChart".  Sign up information is provided on this After Visit Summary.  MyChart is used to connect with patients for Virtual Visits (Telemedicine).  Patients are able to view lab/test results, encounter notes, upcoming appointments, etc.  Non-urgent messages can be sent to your provider as well.   To learn more about what you can do with MyChart, go to [email or web address].    Your next appointment:   1 year(s)  The format for your next appointment:   In Person  Provider:   Peter Nishan, MD     Important Information About Sugar       

## 2022-09-02 ENCOUNTER — Encounter: Payer: Self-pay | Admitting: *Deleted

## 2022-09-18 ENCOUNTER — Encounter: Payer: Self-pay | Admitting: Family Medicine

## 2022-11-04 ENCOUNTER — Encounter: Payer: Self-pay | Admitting: Family

## 2022-11-04 ENCOUNTER — Ambulatory Visit: Payer: 59 | Admitting: Family

## 2022-11-04 VITALS — BP 132/84 | HR 82 | Temp 97.1°F | Ht 61.0 in | Wt 119.4 lb

## 2022-11-04 DIAGNOSIS — M79602 Pain in left arm: Secondary | ICD-10-CM

## 2022-11-04 NOTE — Progress Notes (Signed)
Patient ID: Cathy Vasquez, female    DOB: Nov 10, 1991, 31 y.o.   MRN: [id number]  Chief Complaint  Patient presents with   Elbow Injury    Pt c/o Left arm/ elbow pain, throbbing and sharp. Pt states this has been recurrent off and on for about 3 weeks. Has tried tylenol which does help the pain. Hs a brace but does help.     HPI:      Left elbow pain:  started 3w ago, then felt again a few days again, feels on inside of elbow and up her upper arm, if using or raising her arm it will ache or throb. Denies any numbness or tingling. If applying pressure she will feel sharp pains, pushing off of the arm will cause pain.      Assessment & Plan:  1. Left arm pain - advised on applying ice for tid for initial or aggravated pain, apply heat all other times, same amt of time, can also try OTC analgesic creams/patches, apply tid to help w/healing. Can take 1-2 generic aleve bid for up to 5d to reduce any inflammation. Avoid lifting anything heavier than 5lbs, no heavy pushing/pulling. Do the above for 1-2w, if still not resolving, call back, will look at PT or she can see Dr. Logan Bores, seen in past for upper back pain.    Subjective:    Outpatient Medications Prior to Visit  Medication Sig Dispense Refill   cetirizine (ZYRTEC) 10 MG tablet Take 10 mg by mouth daily.     clindamycin (CLEOCIN) 300 MG capsule Take 2 capsules 30-60 minutes before your appointment as needed 10 capsule 1   Rimegepant Sulfate (NURTEC) 75 MG TBDP Take 75 mg by mouth as needed. 1 tablet 0   triamcinolone ointment (KENALOG) 0.1 % 1 APP ONCE A DAY AT BEDTIME as needed APPLIED TOPICALLY 80 g 0   Ubrogepant (UBRELVY) 100 MG TABS Take 100 mg by mouth as needed. 2 tablet 0   No facility-administered medications prior to visit.   Past Medical History:  Diagnosis Date   Aortic aneurysm (HCC)    Aortic stenosis    Atopic eczema 10/15/2018   Gastroesophageal reflux disease without esophagitis 12/29/2015   - discussed  lifestyle changes - prescribed ranitidine 12/29/15 Rx Zantac 150 mg # 60 BID with refills x 5 given(01/19/16)   Heart disease    Heart murmur    Migraines    Past Surgical History:  Procedure Laterality Date   CARDIAC SURGERY  05/17/2003   Post Ross Procedure   ROSS [facility] PROCEDURE     VENOUS ABLATION Right 2020   varicose veins/venous insufficiency   WISDOM TOOTH EXTRACTION     Allergies  Allergen Reactions   Other Anaphylaxis    Tree Nuts   Amoxicillin Rash   Cefaclor Hives and Rash   Penicillin G Rash      Objective:    Physical Exam Vitals and nursing note reviewed.  Constitutional:      Appearance: Normal appearance.  Cardiovascular:     Rate and Rhythm: Normal rate and regular rhythm.  Pulmonary:     Effort: Pulmonary effort is normal.     Breath sounds: Normal breath sounds.  Musculoskeletal:        General: Normal range of motion.     Left upper arm: Tenderness (mild pain w/palpation medially, close to elbow) present. No swelling or bony tenderness.     Left elbow: No swelling. Normal range of motion. No tenderness.  Skin:    General: Skin is warm and dry.  Neurological:     Mental Status: She is alert.  Psychiatric:        Mood and Affect: Mood normal.        Behavior: Behavior normal.    BP 132/84 (BP Location: Left Arm, Patient Position: Sitting, Cuff Size: Large)   Pulse 82   Temp (!) 97.1 F (36.2 C) (Temporal)   Ht 5\' 1"  (1.549 m)   Wt 119 lb 6 oz (54.1 kg)   LMP  (LMP Unknown)   SpO2 98%   BMI 22.56 kg/m  Wt Readings from Last 3 Encounters:  11/04/22 119 lb 6 oz (54.1 kg)  08/07/22 119 lb (54 kg)  07/24/22 121 lb 12.8 oz (55.2 kg)       07/26/22, NP

## 2022-12-13 ENCOUNTER — Encounter: Payer: Self-pay | Admitting: Family Medicine

## 2022-12-13 ENCOUNTER — Ambulatory Visit (INDEPENDENT_AMBULATORY_CARE_PROVIDER_SITE_OTHER): Payer: 59 | Admitting: Family Medicine

## 2022-12-13 VITALS — BP 120/60 | HR 78 | Temp 98.0°F | Ht 61.0 in | Wt 117.0 lb

## 2022-12-13 DIAGNOSIS — Z2989 Encounter for other specified prophylactic measures: Secondary | ICD-10-CM | POA: Insufficient documentation

## 2022-12-13 DIAGNOSIS — I77810 Thoracic aortic ectasia: Secondary | ICD-10-CM | POA: Diagnosis not present

## 2022-12-13 DIAGNOSIS — Z0000 Encounter for general adult medical examination without abnormal findings: Secondary | ICD-10-CM

## 2022-12-13 DIAGNOSIS — Q230 Congenital stenosis of aortic valve: Secondary | ICD-10-CM

## 2022-12-13 DIAGNOSIS — N938 Other specified abnormal uterine and vaginal bleeding: Secondary | ICD-10-CM | POA: Diagnosis not present

## 2022-12-13 DIAGNOSIS — I340 Nonrheumatic mitral (valve) insufficiency: Secondary | ICD-10-CM

## 2022-12-13 DIAGNOSIS — G5622 Lesion of ulnar nerve, left upper limb: Secondary | ICD-10-CM

## 2022-12-13 DIAGNOSIS — I6521 Occlusion and stenosis of right carotid artery: Secondary | ICD-10-CM

## 2022-12-13 DIAGNOSIS — Z954 Presence of other heart-valve replacement: Secondary | ICD-10-CM | POA: Diagnosis not present

## 2022-12-13 DIAGNOSIS — R202 Paresthesia of skin: Secondary | ICD-10-CM

## 2022-12-13 LAB — LIPID PANEL
Cholesterol: 195 mg/dL (ref 0–200)
HDL: 50.8 mg/dL (ref >39.00)
LDL Cholesterol: 129 mg/dL — ABNORMAL HIGH (ref 0–99)
NonHDL: 144.01
Total CHOL/HDL Ratio: 4
Triglycerides: 74 mg/dL (ref 0.0–149.0)
VLDL: 14.8 mg/dL (ref 0.0–40.0)

## 2022-12-13 LAB — CBC WITH DIFFERENTIAL/PLATELET
Basophils Absolute: 0 10*3/uL (ref 0.0–0.1)
Basophils Relative: 0.5 % (ref 0.0–3.0)
Eosinophils Absolute: 0 10*3/uL (ref 0.0–0.7)
Eosinophils Relative: 0.7 % (ref 0.0–5.0)
HCT: 42 % (ref 36.0–46.0)
Hemoglobin: 14.3 g/dL (ref 12.0–15.0)
Lymphocytes Relative: 26.9 % (ref 12.0–46.0)
Lymphs Abs: 1.8 10*3/uL (ref 0.7–4.0)
MCHC: 34.1 g/dL (ref 30.0–36.0)
MCV: 93.6 fl (ref 78.0–100.0)
Monocytes Absolute: 0.4 10*3/uL (ref 0.1–1.0)
Monocytes Relative: 6 % (ref 3.0–12.0)
Neutro Abs: 4.4 10*3/uL (ref 1.4–7.7)
Neutrophils Relative %: 65.9 % (ref 43.0–77.0)
Platelets: 232 10*3/uL (ref 150.0–400.0)
RBC: 4.49 Mil/uL (ref 3.87–5.11)
RDW: 13.4 % (ref 11.5–15.5)
WBC: 6.7 10*3/uL (ref 4.0–10.5)

## 2022-12-13 LAB — COMPREHENSIVE METABOLIC PANEL
ALT: 10 U/L (ref 0–35)
AST: 14 U/L (ref 0–37)
Albumin: 4.7 g/dL (ref 3.5–5.2)
Alkaline Phosphatase: 33 U/L — ABNORMAL LOW (ref 39–117)
BUN: 12 mg/dL (ref 6–23)
CO2: 28 mEq/L (ref 19–32)
Calcium: 10 mg/dL (ref 8.4–10.5)
Chloride: 103 mEq/L (ref 96–112)
Creatinine, Ser: 0.72 mg/dL (ref 0.40–1.20)
GFR: 111.16 mL/min (ref >60.00)
Glucose, Bld: 82 mg/dL (ref 70–99)
Potassium: 4.5 mEq/L (ref 3.5–5.1)
Sodium: 140 mEq/L (ref 135–145)
Total Bilirubin: 0.8 mg/dL (ref 0.2–1.2)
Total Protein: 7.4 g/dL (ref 6.0–8.3)

## 2022-12-13 NOTE — Progress Notes (Signed)
Subjective  Chief Complaint  Patient presents with   Annual Exam    Pt here for Annual exam and is currently fasting     HPI: Cathy Vasquez is a 32 y.o. female who presents to [facility] today for a Female Wellness Visit.  She also has the concerns and/or needs as listed above in the chief complaint. These will be addressed in addition to the Health Maintenance Visit.   Wellness Visit: annual visit with health maintenance review and exam without Pap  HM: pap due next year. Healthy. Irregular cycles intermittently. Husband with vasectomy. No BTB. No pelvic pain Chronic disease management visit and/or acute problem visit: Reviewed OV for left elbow pain: intermittent catching and sharp pain. No weakness. Reviewed recent cardiology f/u: she is doing very well regarding her cardiovascular health. Stable echo and aortic dilitation; right cartoid occulsion and MVP. Dr. Johnsie Cancel did say we could use low dose ocp IF needed but prefers to avoid hormones if possible.  DUB: length between cycles varies but typically between 21 and 35 days.  Itching in back on right beneath soldier blades. ? Dry skin Left elbow pain: reviewed notes. Gets a sharp localized pain with certain movts: stretching out arm to grab a plate; has happened 2-3 times. No radicular sxs or neck pain; no overuse. Not an elbow "leaner" perse. No weakness in arm.   Assessment  1. Annual physical exam   2. Ascending aorta dilatation (HCC)   3. Congenital aortic stenosis   4. H/O Samak procedure   5. Non-rheumatic mitral regurgitation   6. Carotid occlusion, right   7. Notalgia paresthetica   8. Ulnar neuropathy at elbow of left upper extremity   9. DUB (dysfunctional uterine bleeding)      Plan  Female Wellness Visit: Age appropriate Health Maintenance and Prevention measures were discussed with patient. Included topics are cancer screening recommendations, ways to keep healthy (see AVS) including  dietary and exercise recommendations, regular eye and dental care, use of seat belts, and avoidance of moderate alcohol use and tobacco use. Pap next cpe.  BMI: discussed patient's BMI and encouraged positive lifestyle modifications to help get to or maintain a target BMI. HM needs and immunizations were addressed and ordered. See below for orders. See HM and immunization section for updates. utd Routine labs and screening tests ordered including cmp, cbc and lipids where appropriate. Discussed recommendations regarding Vit D and calcium supplementation (see AVS)  Chronic disease f/u and/or acute problem visit: (deemed necessary to be done in addition to the wellness visit): CV:  stable congenital heart disease/s/p Ross procedure, MVP, palpitations, and monitoring aortic root dilation. Has right carotid occlusion w/o sxs. No heart meds. No sxs. F/u annually.  Migraines are stable on meds. Sees Dr. Billey Gosling.  Ulnar neuropathy, left; suspect mechanical impingement with very intermittent neuropathic pain. Reassured. Trial of b6 bid. Monitor/protect elbow.  Notalgia paresthetica: education given. Itch x prn.  DUB: suspect anovulatory cycles or her normal; no red flag sxs. Avoid OCPs unless becomes bothersome.    Follow up: 12 mo for cpe   Orders Placed This Encounter  Procedures   CBC with Differential/Platelet   Comprehensive metabolic panel   Lipid panel   No orders of the defined types were placed in this encounter.      Body mass index is 22.11 kg/m. Wt Readings from Last 3 Encounters:  12/13/22 117 lb (53.1 kg)  11/04/22 119 lb 6 oz (54.1 kg)  08/07/22  119 lb (54 kg)     Patient Active Problem List   Diagnosis Date Noted   SBE (subacute bacterial endocarditis) prophylaxis candidate 12/13/2022    clindamycin    Notalgia paresthetica 12/13/2022   DUB (dysfunctional uterine bleeding) 12/13/2022   Carotid occlusion, right 08/07/2022    By echo. Monitoring right, cards     Positive D dimer 06/12/2022   Migraine, ophthalmoplegic 11/09/2019   Migraine with aura     Gets aura, visual changes, numbness in arm and face. Migraine equivalents. Started at age 52; normal brain MRI at that time. Responsive to triptans and nsaids. Menstrual migraine association. Headaches were worse with pregnancy.    Atopic eczema 10/15/2018   Dyshidrotic eczema 10/15/2018   Venous insufficiency (chronic) (peripheral) 07/07/2018   Congenital aortic stenosis 11/07/2017   Non-rheumatic mitral regurgitation 03/31/2017   MVP (mitral valve prolapse) 03/31/2017   Ascending aorta dilatation ([facility]) 03/28/2017   H/O Ross procedure 08/25/2016   Varicose veins of right leg with edema 07/25/2016    Cardiology's note venous insufficiency not cardiac related    Health Maintenance  Topic Date Due   PAP SMEAR-Modifier  11/15/2023   DTaP/Tdap/Td (2 - Td or Tdap) 05/10/2026   Hepatitis C Screening  Completed   HIV Screening  Completed   HPV VACCINES  Aged Out   INFLUENZA VACCINE  Discontinued   COVID-19 Vaccine  Discontinued   Immunization History  Administered Date(s) Administered   Influenza,inj,Quad PF,6+ Mos 11/20/2012, 10/06/2017, 10/15/2018, 10/20/2019, 09/04/2020   Influenza-Unspecified 11/20/2012, 09/08/2015, 10/06/2017, 10/15/2018, 10/20/2019   PFIZER(Purple Top)SARS-COV-2 Vaccination 03/03/2020, 03/24/2020   Tdap 05/10/2016   We updated and reviewed the patient's past history in detail and it is documented below. Allergies: Patient  reports that she does not currently use alcohol. Past Medical History Patient  has a past medical history of Aortic aneurysm [facility]), Aortic stenosis, Atopic eczema (10/15/2018), Gastroesophageal reflux disease without esophagitis (12/29/2015), Heart disease, Heart murmur, and Migraines. Past Surgical History Patient  has a past surgical history that includes Cardiac surgery (05/17/2003); Wisdom tooth extraction; Zachary George procedure; and Venous ablation  (Right, 2020). Social History   Socioeconomic History   Marital status: Married    Spouse name: Lake Bells   Number of children: 2   Years of education: Not on file   Highest education level: Some college, no degree  Occupational History   Occupation: stay at home  Tobacco Use   Smoking status: Never   Smokeless tobacco: Never  Vaping Use   Vaping Use: Never used  Substance and Sexual Activity   Alcohol use: Not Currently   Drug use: Not Currently   Sexual activity: Yes    Partners: Male    Birth control/protection: Other-see comments    Comment: Vasectomy   Other Topics Concern   Not on file  Social History Narrative   Lives with family   Caffeine- coffee 1 c, 1 soda or tea   Social Determinants of Health   Financial Resource Strain: Not on file  Food Insecurity: Not on file  Transportation Needs: Not on file  Physical Activity: Not on file  Stress: Not on file  Social Connections: Not on file   Family History  Problem Relation Age of Onset   Hypertension Mother    Hypertension Father    Healthy Sister    Healthy Sister    Heart disease Maternal Grandmother    Diabetes Paternal Merchant navy officer    Healthy Daughter    Healthy Son    Migraines  Maternal Aunt     Review of Systems: Constitutional: negative for fever or malaise Ophthalmic: negative for photophobia, double vision or loss of vision Cardiovascular: negative for chest pain, dyspnea on exertion, or new LE swelling Respiratory: negative for SOB or persistent cough Gastrointestinal: negative for abdominal pain, change in bowel habits or melena Genitourinary: negative for dysuria or gross hematuria, no abnormal uterine bleeding or disharge Musculoskeletal: negative for new gait disturbance or muscular weakness Integumentary: negative for new or persistent rashes, no breast lumps Neurological: negative for TIA or stroke symptoms Psychiatric: negative for SI or delusions Allergic/Immunologic: negative for  hives  Patient Care Team    Relationship Specialty Notifications Start End  Leamon Arnt, MD PCP - General Family Medicine  07/07/18   Josue Hector, MD PCP - Cardiology Cardiology  08/07/22   Ellis Parents, MD Attending Physician Internal Medicine  04/02/19   Geraldo Pitter, MD Referring Physician Cardiology  04/02/19   Jari Pigg, MD Consulting Physician Dermatology  04/02/19   Melissa Noon, Gregory Referring Physician Optometry  04/02/19   Kary Kos, MD Consulting Physician Neurosurgery  04/02/19   [facility]th[facility], [facility]  Specialist  04/02/19   Augustina Mood, [place] Referring Physician Dentistry  04/02/19   Linton Rump, PT Physical Therapist Physical Therapy  04/02/19   [place], [facility]    04/02/19     Objective  Vitals: BP 120/60   Pulse 78   Temp 98 F (36.7 C)   Ht 5\' 1"  (1.549 m)   Wt 117 lb (53.1 kg)   SpO2 98%   BMI 22.11 kg/m  General:  Well developed, well nourished, no acute distress , thin Psych:  Alert and orientedx3,normal mood and affect HEENT:  Normocephalic, atraumatic, non-icteric sclera, PERRL, supple neck without adenopathy, mass or thyromegaly Cardiovascular:  Normal S1, S2, RRR  Respiratory:  Good breath sounds bilaterally, CTAB with normal respiratory effort Gastrointestinal: normal bowel sounds, soft, non-tender, no noted masses. No HSM MSK: no deformities, contusions. Joints are without erythema or swelling.  Left elbow: tender over ulnar groove, no epicodyle ttp. From.  Skin:  Warm, no rashes or suspicious lesions noted, mild hyperpigmentation subscapular bilaterally Neurologic:    Mental status is normal. Gross motor and sensory exams are normal. Normal gait. No tremor Breast Exam: No mass, skin retraction or nipple discharge is appreciated in either breast. No axillary adenopathy. Fibrocystic changes are not noted   Commons side effects, risks, benefits, and alternatives for medications and treatment plan prescribed today were discussed, and the  patient expressed understanding of the given instructions. Patient is instructed to call or message via MyChart if he/she has any questions or concerns regarding our treatment plan. No barriers to understanding were identified. We discussed Red Flag symptoms and signs in detail. Patient expressed understanding regarding what to do in case of urgent or emergency type symptoms.  Medication list was reconciled, printed and provided to the patient in AVS. Patient instructions and summary information was reviewed with the patient as documented in the AVS. This note was prepared with assistance of Dragon voice recognition software. Occasional wrong-word or sound-a-like substitutions may have occurred due to the inherent limitations of voice recognition software .

## 2022-12-13 NOTE — Patient Instructions (Signed)
Please return in 12 months for your annual complete physical; please come fasting.   I will release your lab results to you on your MyChart account with further instructions. You may see the results before I do, but when I review them I will send you a message with my report or have my assistant call you if things need to be discussed. Please reply to my message with any questions. Thank you!   Start Vitamin B6 twice daily to see if this helps your elbow pain.  Itch-X can be helpful for the back itching.   If you have any questions or concerns, please don't hesitate to send me a message via MyChart or call the office at [phone]. Thank you for visiting with Cathy Vasquez today! It's our pleasure caring for you.

## 2022-12-23 ENCOUNTER — Ambulatory Visit (INDEPENDENT_AMBULATORY_CARE_PROVIDER_SITE_OTHER): Payer: 59

## 2022-12-23 ENCOUNTER — Ambulatory Visit (INDEPENDENT_AMBULATORY_CARE_PROVIDER_SITE_OTHER): Payer: 59 | Admitting: Family Medicine

## 2022-12-23 VITALS — BP 130/84 | HR 76 | Ht 61.0 in | Wt 118.0 lb

## 2022-12-23 DIAGNOSIS — M62838 Other muscle spasm: Secondary | ICD-10-CM

## 2022-12-23 DIAGNOSIS — M546 Pain in thoracic spine: Secondary | ICD-10-CM | POA: Diagnosis not present

## 2022-12-23 MED ORDER — TIZANIDINE HCL 4 MG PO TABS: 2.0000 mg | ORAL_TABLET | Freq: Three times a day (TID) | ORAL | 1 refills | Status: AC | PRN

## 2022-12-23 NOTE — Progress Notes (Signed)
I, Peterson Lombard, LAT, ATC acting as a scribe for Lynne Leader, MD.  Cathy Vasquez is a 32 y.o. female who presents to [facility] today for back pain.  Patient was previously seen by Dr. Georgina Snell on 04/30/2021 for neck pain.  Today, patient presents with back pain that resolved and then worsens 3-4 x year. Pt states that Nov-Dec pain has worsened. Patient locates pain to upper back along the midline, L trapz, and worse on the L side.  Radiating pain: yes- L LE numbness/tingling: no LE weakness: no Aggravates: worse in the mornings, sitting, laying down,  Treatments tried: heat, rest, Tylenol, IBU, modify activity, baclofen  Pertinent review of systems: No fevers or chills  Relevant historical information: Carotid occlusion right with congenital aortic stenosis   Exam:  BP 130/84   Pulse 76   Ht 5\' 1"  (1.549 m)   Wt 118 lb (53.5 kg)   SpO2 100%   BMI 22.30 kg/m  General: Well Developed, well nourished, and in no acute distress.   MSK: C-spine: Normal appearing Nontender to palpation spinal midline.  Tender palpation left trapezius and cervical paraspinal musculature.  Upper extremity strength is intact.    Lab and Radiology Results No results found for this or any previous visit (from the past 72 hour(s)). DG Thoracic Spine 2 View  Result Date: 12/23/2022 CLINICAL DATA:  T-spine pain EXAM: THORACIC SPINE 2 VIEWS COMPARISON:  None Available. FINDINGS: Curvature of the thoracolumbar spine, apex to the left. No other malalignment. No fractures. No significant degenerative changes. No other bony or soft tissue abnormalities. IMPRESSION: Curvature of the thoracolumbar spine, apex to the right. No other abnormalities. Electronically Signed   By: Dorise Bullion III M.D.   On: 12/23/2022 10:26   I, Lynne Leader, personally (independently) visualized and performed the interpretation of the images attached in this note.   CT scan images of cervical spine  obtained March 2023 personally independently interpreted by me today do not show severe degenerative changes or significant malalignment.  Assessment and Plan: 32 y.o. female with chronic neck and upper back pain.  Pain thought to be due to a muscle spasm and dysfunction.  She is a good candidate for physical therapy trial.  Plan to refer to PT.  Recommend heating pad and also prescribed tizanidine.    PDMP not reviewed this encounter. Orders Placed This Encounter  Procedures   DG Thoracic Spine 2 View    Standing Status:   Future    Number of Occurrences:   1    Standing Expiration Date:   12/24/2023    Order Specific Question:   Reason for Exam (SYMPTOM  OR DIAGNOSIS REQUIRED)    Answer:   eval pain tspine    Order Specific Question:   Is patient pregnant?    Answer:   No    Order Specific Question:   Preferred imaging location?    Answer:   [facility]   Ambulatory referral to Physical Therapy    Referral Priority:   Routine    Referral Type:   Physical Medicine    Referral Reason:   Specialty Services Required    Requested Specialty:   Physical Therapy    Number of Visits Requested:   1   Meds ordered this encounter  Medications   tiZANidine (ZANAFLEX) 4 MG tablet    Sig: Take 0.5-1 tablets (2-4 mg total) by mouth every 8 (eight) hours as needed for muscle spasms.  Dispense:  30 tablet    Refill:  1     Discussed warning signs or symptoms. Please see discharge instructions. Patient expresses understanding.   The above documentation has been reviewed and is accurate and complete Lynne Leader, M.D.

## 2022-12-23 NOTE — Patient Instructions (Addendum)
Thank you for coming in today.   Please get an Xray today before you leave   I've referred you to Physical Therapy.  Let us know if you don't hear from them in one week.   Use tizanidine as needed mostly at bedtime.   Heating pad for the neck and shoulder on [employer] could help.

## 2022-12-24 NOTE — Progress Notes (Signed)
Thoracic spine x-ray shows a little bit of scoliosis but otherwise okay with no arthritis or fractures.

## 2023-01-08 ENCOUNTER — Ambulatory Visit: Payer: 59 | Admitting: Physical Therapy

## 2023-01-08 NOTE — Therapy (Unsigned)
OUTPATIENT PHYSICAL THERAPY CERVICAL EVALUATION   Patient Name: Cathy Vasquez MRN: [id number] DOB:1991/10/12, 32 y.o., female Today's Date: 01/09/2023  END OF SESSION:  PT End of Session - 01/09/23 0937     Visit Number 1    Authorization Type [employer]    PT Start Time [phone]    PT Stop Time 1010    PT Time Calculation (min) 33 min             Past Medical History:  Diagnosis Date   Aortic aneurysm ([place])    Aortic stenosis    Atopic eczema 10/15/2018   Gastroesophageal reflux disease without esophagitis 12/29/2015   - discussed lifestyle changes - prescribed ranitidine 12/29/15 Rx Zantac 150 mg # 60 BID with refills x 5 given(01/19/16)   Heart disease    Heart murmur    Migraines    Past Surgical History:  Procedure Laterality Date   CARDIAC SURGERY  05/17/2003   Post Ross Procedure   ROSS [facility] PROCEDURE     VENOUS ABLATION Right 2020   varicose veins/venous insufficiency   WISDOM TOOTH EXTRACTION     Patient Active Problem List   Diagnosis Date Noted   SBE (subacute bacterial endocarditis) prophylaxis candidate 12/13/2022   Notalgia paresthetica 12/13/2022   DUB (dysfunctional uterine bleeding) 12/13/2022   Carotid occlusion, right 08/07/2022   Positive D dimer 06/12/2022   Migraine, ophthalmoplegic 11/09/2019   Migraine with aura    Atopic eczema 10/15/2018   Dyshidrotic eczema 10/15/2018   Venous insufficiency (chronic) (peripheral) 07/07/2018   Congenital aortic stenosis 11/07/2017   Non-rheumatic mitral regurgitation 03/31/2017   MVP (mitral valve prolapse) 03/31/2017   Ascending aorta dilatation ([place]) 03/28/2017   H/O Ross procedure 08/25/2016   Varicose veins of right leg with edema 07/25/2016    PCP: Leamon Arnt, MD  REFERRING PROVIDER: Gregor Hams, MD  REFERRING DIAG: [phone] (ICD-10-CM) - Neck muscle spasm M54.6 (ICD-10-CM) - Pain in thoracic spine  THERAPY DIAG:  Cervicalgia  Muscle weakness (generalized)  Rationale for  Evaluation and Treatment: Rehabilitation  ONSET DATE: 2 years ago  SUBJECTIVE:                                                                                                                                                                                                         SUBJECTIVE STATEMENT: States that initially she hurt her back after she went to a theme park and rode some spinning rides with her kids and carrying a backpack on her back. This eventually resolved so she didn't  seek PT at the time. Pain returned last year 10/10/22 and continued for all the month of December. Pain is better now but she doesn't want pain to return. States she still has uneasiness with certain movements/activities.    States that it usually is painful on the right but sometimes it goes to the left. States she can't lay on the side that hurts. When she uses her arm it more she feels like it will set it off.  States she doesn't really exercise but wants to do better. Pain with breathing at times  PERTINENT HISTORY:  open heart surgeries x6 one at 19 weeks old and 32 years old-has, heart aneurysm.   PAIN:  Are you having pain? Yes: NPRS scale: at worse no number given/10 Pain location: mid thoracic and radiates to neck and shoulders Both sides  Pain description: tender,sharp when it hurts, achy Aggravating factors: bending over into washer and reaching over head, pushign self off floor Relieving factors: heat, rest  PRECAUTIONS: None  WEIGHT BEARING RESTRICTIONS: No  FALLS:  Has patient fallen in last 6 months? No   OCCUPATION: stay at home mom, home schools   PLOF: Independent  PATIENT GOALS: reduce incidence of pain  NEXT MD VISIT:   OBJECTIVE:   DIAGNOSTIC FINDINGS:  Xray 12/23/22 T spine FINDINGS: Curvature of the thoracolumbar spine, apex to the left. No other malalignment. No fractures. No significant degenerative changes. No other bony or soft tissue abnormalities.    IMPRESSION: Curvature of the thoracolumbar spine, apex to the right. No other abnormalities.   COGNITION: Overall cognitive status: Within functional limits for tasks assessed   POSTURE:  Observation: Rests neck in upper cervical ext, legs crossed, sacral sitter, thoracic extension and forward head noted  PALPATION: Increased tenderness to palpation along B Rhomboids and UT    CERVICAL ROM:   Active ROM A/PROM (deg) eval  Flexion WFL  Extension WFL  Right lateral flexion WFL  Left lateral flexion WFL  Right rotation WFL*  Left rotation WFL*   (Blank rows = not tested) *tight/tender on left side    UE Measurements Upper Extremity Right 01/09/2023 Left 01/09/2023   A/PROM MMT A/PROM MMT  Shoulder Flexion 185 4 185 4  Shoulder Extension      Shoulder Abduction WFL 4 WFL 4  Shoulder Adduction      Shoulder Internal Rotation Reaches to T12 SP 4- Reaches to T6 SP (winging) 4-*  Shoulder External Rotation Reaches to T6 SP 4- Reaches T6 SP  4-  Elbow Flexion      Elbow Extension      Wrist Flexion      Wrist Extension      Wrist Supination      Wrist Pronation      Wrist Ulnar Deviation      Wrist Radial Deviation      Grip Strength NA  NA     (Blank rows = not tested)   * pain   Right handed   CERVICAL SPECIAL TESTS:  Positive Spurling with left SB on left side in neck painful   TODAY'S TREATMENT:  DATE: 01/09/2023 Therapeutic Exercise:  Aerobic: Supine: Prone:  Seated:  Standing: Neuromuscular Re-education:long exhale with lower rib decompression and approximation- 10 minutes, on active vs passive postures. Manual Therapy: Therapeutic Activity: Self Care: Trigger Point Dry Needling:  Modalities:   PATIENT EDUCATION:  Education details: on current presentation, on HEP, on clinical outcomes score and POC, on rationel for interventions  and posture Person educated: Patient Education method: Explanation, Demonstration, and Handouts Education comprehension: verbalized understanding   HOME EXERCISE PROGRAM: No medbridge just written out in instructions   ASSESSMENT:  CLINICAL IMPRESSION: Patient presents with episodic mid thoracic pain that radiates up into neck and shoulder. Current episode is resolving but would like to address causes of her pain to reduce episodes and severity of pain. Patient demonstrates good cervical and UE motion but limitations in thoracic motion and generalized weakness throughout likely contributing to current condition. Patent's history of open heart surgery also likely contributing to current presentation. Patient would greatly benefit from skilled PT to improve overall function and QOL.   OBJECTIVE IMPAIRMENTS: decreased activity tolerance, decreased ROM, decreased strength, improper body mechanics, postural dysfunction, and pain.   ACTIVITY LIMITATIONS: lifting, bending, and reach over head  PARTICIPATION LIMITATIONS: meal prep, cleaning, laundry, and shopping  PERSONAL FACTORS: Age, Fitness, Time since onset of injury/illness/exacerbation, and 1 comorbidity: hx of x2 open heart surgery (last when she was 32 years old)  are also affecting patient's functional outcome.   REHAB POTENTIAL: Good  CLINICAL DECISION MAKING: Stable/uncomplicated  EVALUATION COMPLEXITY: Low   GOALS: Goals reviewed with patient? yes  SHORT TERM GOALS: Target date: 02/06/2023  Patient will be independent in self management strategies to improve quality of life and functional outcomes. Baseline: New Program Goal status: INITIAL  2.  Patient will report at least 50% improvement in overall symptoms and/or function to demonstrate improved functional mobility Baseline: 0% better Goal status: INITIAL  3.  Patient will be able to lift plates into cabinet without pain or discomfort to improve ability to perform  daily activities  Baseline: painful Goal status: INITIAL  4.  Patient will be able to demonstrate good diaphragmatic breath to reduce anterior/upper chest breathing  Baseline: chest breather Goal status: INITIAL    LONG TERM GOALS: Target date: 03/06/2023   Patient will report at least 75% improvement in overall symptoms and/or function to demonstrate improved functional mobility Baseline: 0% better Goal status: INITIAL  2.  Patient will be able to perform all daily tasks without pain or difficulty to improve overall function and QOL. Baseline: see above Goal status: INITIAL  3.  Patient will be able to demonstrate good active sitting posture to reduce incidence of pain Baseline: unable Goal status: INITIAL      PLAN:  PT FREQUENCY: 1-2x/week for total of 12 visits over 8 week certification   PT DURATION: 8 weeks  PLANNED INTERVENTIONS: Therapeutic exercises, Therapeutic activity, Neuromuscular re-education, Balance training, Gait training, Patient/Family education, Self Care, Joint mobilization, Joint manipulation, Vestibular training, Canalith repositioning, Orthotic/Fit training, DME instructions, Aquatic Therapy, Dry Needling, Electrical stimulation, Spinal manipulation, Spinal mobilization, Cryotherapy, Moist heat, Traction, Ultrasound, Ionotophoresis 4mg /ml Dexamethasone, Manual therapy, and Re-evaluation  PLAN FOR NEXT SESSION: belly breathing, f/u long exhale, prone breathing, chin tucks, scapular protraction   9:37 AM, 01/09/23 Jerene Pitch, DPT Physical Therapy with [facility]

## 2023-01-09 ENCOUNTER — Ambulatory Visit: Payer: 59 | Admitting: Physical Therapy

## 2023-01-09 ENCOUNTER — Encounter: Payer: Self-pay | Admitting: Physical Therapy

## 2023-01-09 DIAGNOSIS — M542 Cervicalgia: Secondary | ICD-10-CM | POA: Diagnosis not present

## 2023-01-09 DIAGNOSIS — M6281 Muscle weakness (generalized): Secondary | ICD-10-CM | POA: Diagnosis not present

## 2023-01-09 NOTE — Patient Instructions (Signed)
Lay on your back with your knees bent and feet flat on the ground, with your hands on your lower ribs and pretend you have 100 birthday candles in front of you and you are trying to blow them all out in one breath- try to completely empty your lungs and hold for a second and then rest and repeat practice at least 5 minutes/day-

## 2023-01-13 NOTE — Therapy (Unsigned)
OUTPATIENT PHYSICAL THERAPY TREATMENT NOTE   Patient Name: Cathy Vasquez MRN: [id number] DOB:[birth year], 32 y.o., female Today's Date: 01/14/2023  PCP: Leamon Arnt, MD   REFERRING PROVIDER: Gregor Hams, MD  END OF SESSION:   PT End of Session - 01/14/23 0935     Visit Number 2    Number of Visits 12    Date for PT Re-Evaluation 03/06/23    Authorization Type [employer]    PT Start Time [phone]    PT Stop Time 1014    PT Time Calculation (min) 38 min             Past Medical History:  Diagnosis Date   Aortic aneurysm ([place])    Aortic stenosis    Atopic eczema 10/15/2018   Gastroesophageal reflux disease without esophagitis 12/29/2015   - discussed lifestyle changes - prescribed ranitidine 12/29/15 Rx Zantac 150 mg # 60 BID with refills x 5 given(01/19/16)   Heart disease    Heart murmur    Migraines    Past Surgical History:  Procedure Laterality Date   CARDIAC SURGERY  05/17/2003   Post Ross Procedure   ROSS [facility] PROCEDURE     VENOUS ABLATION Right 2020   varicose veins/venous insufficiency   WISDOM TOOTH EXTRACTION     Patient Active Problem List   Diagnosis Date Noted   SBE (subacute bacterial endocarditis) prophylaxis candidate 12/13/2022   Notalgia paresthetica 12/13/2022   DUB (dysfunctional uterine bleeding) 12/13/2022   Carotid occlusion, right 08/07/2022   Positive D dimer 06/12/2022   Migraine, ophthalmoplegic 11/09/2019   Migraine with aura    Atopic eczema 10/15/2018   Dyshidrotic eczema 10/15/2018   Venous insufficiency (chronic) (peripheral) 07/07/2018   Congenital aortic stenosis 11/07/2017   Non-rheumatic mitral regurgitation 03/31/2017   MVP (mitral valve prolapse) 03/31/2017   Ascending aorta dilatation ([place]) 03/28/2017   H/O Ross procedure 08/25/2016   Varicose veins of right leg with edema 07/25/2016    THERAPY DIAG:  Cervicalgia  REFERRING DIAG: G86.761 (ICD-10-CM) - Neck muscle spasm M54.6 (ICD-10-CM) - Pain in  thoracic spine   Rationale for Evaluation and Treatment: Rehabilitation   ONSET DATE: 2 years ago   SUBJECTIVE:                                                                                                                                                                                                          SUBJECTIVE STATEMENT: 01/14/2023 States that her pain feels better and has been more aware of her movements.   Eval:  States that initially she hurt her back after she went to a theme park and rode some spinning rides with her kids and carrying a backpack on her back. This eventually resolved so she didn't seek PT at the time. Pain returned last year 10/10/22 and continued for all the month of December. Pain is better now but she doesn't want pain to return. States she still has uneasiness with certain movements/activities.     States that it usually is painful on the right but sometimes it goes to the left. States she can't lay on the side that hurts. When she uses her arm it more she feels like it will set it off.  States she doesn't really exercise but wants to do better. Pain with breathing at times   PERTINENT HISTORY:  open heart surgeries x44 one at 65 weeks old and 32 years old-has, heart aneurysm.    PAIN:  Are you having pain? no: NPRS scale: 0/10 Pain location: mid thoracic and radiates to neck and shoulders Both sides  Pain description: tender,sharp when it hurts, achy Aggravating factors: bending over into washer and reaching over head, pushign self off floor Relieving factors: heat, rest   PRECAUTIONS: None   WEIGHT BEARING RESTRICTIONS: No   FALLS:  Has patient fallen in last 6 months? No     OCCUPATION: stay at home mom, home schools    PLOF: Independent   PATIENT GOALS: reduce incidence of pain   NEXT MD VISIT:    OBJECTIVE:    DIAGNOSTIC FINDINGS:  Xray 12/23/22 T spine FINDINGS: Curvature of the thoracolumbar spine, apex to the left. No  other malalignment. No fractures. No significant degenerative changes. No other bony or soft tissue abnormalities.   IMPRESSION: Curvature of the thoracolumbar spine, apex to the right. No other abnormalities.     COGNITION: Overall cognitive status: Within functional limits for tasks assessed     POSTURE:  Observation: Rests neck in upper cervical ext, legs crossed, sacral sitter, thoracic extension and forward head noted   PALPATION: Increased tenderness to palpation along B Rhomboids and UT            CERVICAL ROM:    Active ROM A/PROM (deg) eval  Flexion WFL  Extension WFL  Right lateral flexion WFL  Left lateral flexion WFL  Right rotation WFL*  Left rotation WFL*   (Blank rows = not tested) *tight/tender on left side               UE Measurements       Upper Extremity Right 01/09/2023 Left 01/09/2023    A/PROM MMT A/PROM MMT  Shoulder Flexion 185 4 185 4  Shoulder Extension          Shoulder Abduction WFL 4 WFL 4  Shoulder Adduction          Shoulder Internal Rotation Reaches to T12 SP 4- Reaches to T6 SP (winging) 4-*  Shoulder External Rotation Reaches to T6 SP 4- Reaches T6 SP  4-  Elbow Flexion          Elbow Extension          Wrist Flexion          Wrist Extension          Wrist Supination          Wrist Pronation          Wrist Ulnar Deviation          Wrist Radial Deviation  Grip Strength NA   NA                          (Blank rows = not tested)                       * pain                       Right handed    CERVICAL SPECIAL TESTS:  Positive Spurling with left SB on left side in neck painful     TODAY'S TREATMENT:                                                                                                                              DATE: 01/14/2023  Therapeutic Exercise:    Aerobic: Supine: Prone:    Seated:    Standing: Neuromuscular Re-education:long exhale with lower rib decompression and approximation- 15 minutes,   belly breathing - 15 minutes - tactile and verbal cues  Manual Therapy: Therapeutic Activity: Self Care: Trigger Point Dry Needling:  Modalities:    PATIENT EDUCATION:  Education details: on HEP, on importance of belly breathing, postures and rationale behind interventions, on sitting on sit bones not tailbones Person educated: Patient Education method: Programmer, multimedia, Demonstration, and Handouts Education comprehension: verbalized understanding     HOME EXERCISE PROGRAM: [id number]   ASSESSMENT:   CLINICAL IMPRESSION: 01/14/2023 Session focused on breathing and advancement of exercises as well as rationale behind interventions and how they relate to cervical spine. Verbal and tactile cues throughout along with prior demonstrating. No pain noted during session. Will continue with core activation, posture and cervical stability as tolerated.   Eval: Patient presents with episodic mid thoracic pain that radiates up into neck and shoulder. Current episode is resolving but would like to address causes of her pain to reduce episodes and severity of pain. Patient demonstrates good cervical and UE motion but limitations in thoracic motion and generalized weakness throughout likely contributing to current condition. Patent's history of open heart surgery also likely contributing to current presentation. Patient would greatly benefit from skilled PT to improve overall function and QOL.    OBJECTIVE IMPAIRMENTS: decreased activity tolerance, decreased ROM, decreased strength, improper body mechanics, postural dysfunction, and pain.    ACTIVITY LIMITATIONS: lifting, bending, and reach over head   PARTICIPATION LIMITATIONS: meal prep, cleaning, laundry, and shopping   PERSONAL FACTORS: Age, Fitness, Time since onset of injury/illness/exacerbation, and 1 comorbidity: hx of x2 open heart surgery (last when she was 32 years old)  are also affecting patient's functional outcome.    REHAB POTENTIAL: Good    CLINICAL DECISION MAKING: Stable/uncomplicated   EVALUATION COMPLEXITY: Low     GOALS: Goals reviewed with patient? yes   SHORT TERM GOALS: Target date: 02/06/2023  Patient will be independent in self management strategies to improve quality of life and functional outcomes.  Baseline: New Program Goal status: INITIAL   2.  Patient will report at least 50% improvement in overall symptoms and/or function to demonstrate improved functional mobility Baseline: 0% better Goal status: INITIAL   3.  Patient will be able to lift plates into cabinet without pain or discomfort to improve ability to perform daily activities  Baseline: painful Goal status: INITIAL   4.  Patient will be able to demonstrate good diaphragmatic breath to reduce anterior/upper chest breathing  Baseline: chest breather Goal status: INITIAL       LONG TERM GOALS: Target date: 03/06/2023    Patient will report at least 75% improvement in overall symptoms and/or function to demonstrate improved functional mobility Baseline: 0% better Goal status: INITIAL   2.  Patient will be able to perform all daily tasks without pain or difficulty to improve overall function and QOL. Baseline: see above Goal status: INITIAL   3.  Patient will be able to demonstrate good active sitting posture to reduce incidence of pain Baseline: unable Goal status: INITIAL         PLAN:   PT FREQUENCY: 1-2x/week for total of 12 visits over 8 week certification    PT DURATION: 8 weeks   PLANNED INTERVENTIONS: Therapeutic exercises, Therapeutic activity, Neuromuscular re-education, Balance training, Gait training, Patient/Family education, Self Care, Joint mobilization, Joint manipulation, Vestibular training, Canalith repositioning, Orthotic/Fit training, DME instructions, Aquatic Therapy, Dry Needling, Electrical stimulation, Spinal manipulation, Spinal mobilization, Cryotherapy, Moist heat, Traction, Ultrasound, Ionotophoresis 4mg /ml  Dexamethasone, Manual therapy, and Re-evaluation   PLAN FOR NEXT SESSION: belly breathing, f/u long exhale, prone breathing, chin tucks, scapular protraction     9:35 AM, 01/14/23 Jerene Pitch, DPT Physical Therapy with [facility]

## 2023-01-14 ENCOUNTER — Encounter: Payer: Self-pay | Admitting: Physical Therapy

## 2023-01-14 ENCOUNTER — Ambulatory Visit: Payer: 59 | Admitting: Physical Therapy

## 2023-01-14 DIAGNOSIS — M542 Cervicalgia: Secondary | ICD-10-CM

## 2023-01-16 ENCOUNTER — Ambulatory Visit: Payer: 59 | Admitting: Physical Therapy

## 2023-01-16 ENCOUNTER — Encounter: Payer: Self-pay | Admitting: Physical Therapy

## 2023-01-16 DIAGNOSIS — M542 Cervicalgia: Secondary | ICD-10-CM

## 2023-01-16 DIAGNOSIS — M6281 Muscle weakness (generalized): Secondary | ICD-10-CM

## 2023-01-16 NOTE — Therapy (Addendum)
OUTPATIENT PHYSICAL THERAPY TREATMENT NOTE   Patient Name: Cathy Vasquez MRN: [id number] DOB:Oct 15, 1991, 32 y.o., female Today's Date: 01/16/2023  PCP: Leamon Arnt, MD   REFERRING PROVIDER: Gregor Hams, MD  END OF SESSION:   PT End of Session - 01/16/23 1015     Visit Number 3    Number of Visits 12    Date for PT Re-Evaluation 03/06/23    Authorization Type [employer]    PT Start Time 1016    PT Stop Time 1055    PT Time Calculation (min) 39 min             Past Medical History:  Diagnosis Date   Aortic aneurysm ([place])    Aortic stenosis    Atopic eczema 10/15/2018   Gastroesophageal reflux disease without esophagitis 12/29/2015   - discussed lifestyle changes - prescribed ranitidine 12/29/15 Rx Zantac 150 mg # 60 BID with refills x 5 given(01/19/16)   Heart disease    Heart murmur    Migraines    Past Surgical History:  Procedure Laterality Date   CARDIAC SURGERY  05/17/2003   Post Ross Procedure   ROSS [facility] PROCEDURE     VENOUS ABLATION Right 2020   varicose veins/venous insufficiency   WISDOM TOOTH EXTRACTION     Patient Active Problem List   Diagnosis Date Noted   SBE (subacute bacterial endocarditis) prophylaxis candidate 12/13/2022   Notalgia paresthetica 12/13/2022   DUB (dysfunctional uterine bleeding) 12/13/2022   Carotid occlusion, right 08/07/2022   Positive D dimer 06/12/2022   Migraine, ophthalmoplegic 11/09/2019   Migraine with aura    Atopic eczema 10/15/2018   Dyshidrotic eczema 10/15/2018   Venous insufficiency (chronic) (peripheral) 07/07/2018   Congenital aortic stenosis 11/07/2017   Non-rheumatic mitral regurgitation 03/31/2017   MVP (mitral valve prolapse) 03/31/2017   Ascending aorta dilatation ([place]) 03/28/2017   H/O Ross procedure 08/25/2016   Varicose veins of right leg with edema 07/25/2016    THERAPY DIAG:  Cervicalgia  Muscle weakness (generalized)  REFERRING DIAG: U20.254 (ICD-10-CM) - Neck muscle  spasm M54.6 (ICD-10-CM) - Pain in thoracic spine   Rationale for Evaluation and Treatment: Rehabilitation   ONSET DATE: 2 years ago   SUBJECTIVE:                                                                                                                                                                                                          SUBJECTIVE STATEMENT: 01/16/2023 States that she didn't have any soreness after last session. States she is  sitting better and barely thinking about it. States she is still having some catches in her neck with rotation   Eval: States that initially she hurt her back after she went to a theme park and rode some spinning rides with her kids and carrying a backpack on her back. This eventually resolved so she didn't seek PT at the time. Pain returned last year 10/10/22 and continued for all the month of December. Pain is better now but she doesn't want pain to return. States she still has uneasiness with certain movements/activities.     States that it usually is painful on the right but sometimes it goes to the left. States she can't lay on the side that hurts. When she uses her arm it more she feels like it will set it off.  States she doesn't really exercise but wants to do better. Pain with breathing at times   PERTINENT HISTORY:  open heart surgeries x35 one at 91 weeks old and 32 years old-has, heart aneurysm. 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 no: NPRS scale: 0/10 Pain location: mid thoracic and radiates to neck and shoulders Both sides  Pain description: tender,sharp when it hurts, achy Aggravating factors: bending over into washer and reaching over head, pushign self off floor Relieving factors: heat, rest   PRECAUTIONS: None   WEIGHT BEARING RESTRICTIONS: No   FALLS:  Has patient fallen in last 6 months? No     OCCUPATION: stay at home mom, home schools    PLOF: Independent   PATIENT GOALS:  reduce incidence of pain   NEXT MD VISIT:    OBJECTIVE:    DIAGNOSTIC FINDINGS:  Xray 12/23/22 T spine FINDINGS: Curvature of the thoracolumbar spine, apex to the left. No other malalignment. No fractures. No significant degenerative changes. No other bony or soft tissue abnormalities.   IMPRESSION: Curvature of the thoracolumbar spine, apex to the right. No other abnormalities.     COGNITION: Overall cognitive status: Within functional limits for tasks assessed     POSTURE:  Observation: Rests neck in upper cervical ext, legs crossed, sacral sitter, thoracic extension and forward head noted   PALPATION: Increased tenderness to palpation along B Rhomboids and UT            CERVICAL ROM:    Active ROM A/PROM (deg) eval  Flexion WFL  Extension WFL  Right lateral flexion WFL  Left lateral flexion WFL  Right rotation WFL*  Left rotation WFL*   (Blank rows = not tested) *tight/tender on left side               UE Measurements       Upper Extremity Right 01/09/2023 Left 01/09/2023    A/PROM MMT A/PROM MMT  Shoulder Flexion 185 4 185 4  Shoulder Extension          Shoulder Abduction WFL 4 WFL 4  Shoulder Adduction          Shoulder Internal Rotation Reaches to T12 SP 4- Reaches to T6 SP (winging) 4-*  Shoulder External Rotation Reaches to T6 SP 4- Reaches T6 SP  4-  Elbow Flexion          Elbow Extension          Wrist Flexion          Wrist Extension          Wrist Supination  Wrist Pronation          Wrist Ulnar Deviation          Wrist Radial Deviation          Grip Strength NA   NA                          (Blank rows = not tested)                       * pain                       Right handed    CERVICAL SPECIAL TESTS:  Positive Spurling with left SB on left side in neck painful     TODAY'S TREATMENT:                                                                                                                              DATE:  01/16/2023  Therapeutic Exercise:    Aerobic: Supine: Prone:    Seated:    Standing: Neuromuscular Re-education chin tuck with PT assist then AAROM then AROM - 15 minutes with education on goal, cervical ROT PROM/AAROM/AROM - goal of relaxing SCM and not side bending - 10 minutes Manual Therapy: Therapeutic Activity: Self Care: Trigger Point Dry Needling:  Modalities:    PATIENT EDUCATION:  Education details: on posture, rationale for interventions, of anatomy. Educated on use of compression stockings to help with swelling in legs - tights vs thigh highs and used 10-68mmHG for comfort to start Person educated: Patient Education method: Explanation, Demonstration, and Handouts Education comprehension: verbalized understanding     HOME EXERCISE PROGRAM: [id number]   ASSESSMENT:   CLINICAL IMPRESSION: 01/16/2023 Session focused on education and learning how to perform cervical retraction and rotation without accessory muscle activation/secondary motions. Tolerated well but continues to activate SCM excessively. No pain noted during session, added new exercises to HEP  Eval: Patient presents with episodic mid thoracic pain that radiates up into neck and shoulder. Current episode is resolving but would like to address causes of her pain to reduce episodes and severity of pain. Patient demonstrates good cervical and UE motion but limitations in thoracic motion and generalized weakness throughout likely contributing to current condition. Patent's history of open heart surgery also likely contributing to current presentation. Patient would greatly benefit from skilled PT to improve overall function and QOL.    OBJECTIVE IMPAIRMENTS: decreased activity tolerance, decreased ROM, decreased strength, improper body mechanics, postural dysfunction, and pain.    ACTIVITY LIMITATIONS: lifting, bending, and reach over head   PARTICIPATION LIMITATIONS: meal prep, cleaning, laundry, and shopping    PERSONAL FACTORS: Age, Fitness, Time since onset of injury/illness/exacerbation, and 1 comorbidity: hx of x2 open heart surgery (last when she was 32 years old)  are also affecting patient's functional outcome.    REHAB POTENTIAL: Good  CLINICAL DECISION MAKING: Stable/uncomplicated   EVALUATION COMPLEXITY: Low     GOALS: Goals reviewed with patient? yes   SHORT TERM GOALS: Target date: 02/06/2023  Patient will be independent in self management strategies to improve quality of life and functional outcomes. Baseline: New Program Goal status: INITIAL   2.  Patient will report at least 50% improvement in overall symptoms and/or function to demonstrate improved functional mobility Baseline: 0% better Goal status: INITIAL   3.  Patient will be able to lift plates into cabinet without pain or discomfort to improve ability to perform daily activities  Baseline: painful Goal status: INITIAL   4.  Patient will be able to demonstrate good diaphragmatic breath to reduce anterior/upper chest breathing  Baseline: chest breather Goal status: INITIAL       LONG TERM GOALS: Target date: 03/06/2023    Patient will report at least 75% improvement in overall symptoms and/or function to demonstrate improved functional mobility Baseline: 0% better Goal status: INITIAL   2.  Patient will be able to perform all daily tasks without pain or difficulty to improve overall function and QOL. Baseline: see above Goal status: INITIAL   3.  Patient will be able to demonstrate good active sitting posture to reduce incidence of pain Baseline: unable Goal status: INITIAL         PLAN:   PT FREQUENCY: 1-2x/week for total of 12 visits over 8 week certification    PT DURATION: 8 weeks   PLANNED INTERVENTIONS: Therapeutic exercises, Therapeutic activity, Neuromuscular re-education, Balance training, Gait training, Patient/Family education, Self Care, Joint mobilization, Joint manipulation, Vestibular  training, Canalith repositioning, Orthotic/Fit training, DME instructions, Aquatic Therapy, Dry Needling, Electrical stimulation, Spinal manipulation, Spinal mobilization, Cryotherapy, Moist heat, Traction, Ultrasound, Ionotophoresis 4mg /ml Dexamethasone, Manual therapy, and Re-evaluation   PLAN FOR NEXT SESSION: belly breathing, f/u long exhale, prone breathing, chin tucks, scapular protraction     11:21 AM, 01/16/23 Jerene Pitch, DPT Physical Therapy with [facility]

## 2023-01-21 ENCOUNTER — Encounter: Payer: Self-pay | Admitting: Physical Therapy

## 2023-01-21 ENCOUNTER — Ambulatory Visit: Payer: 59 | Admitting: Physical Therapy

## 2023-01-21 DIAGNOSIS — M542 Cervicalgia: Secondary | ICD-10-CM

## 2023-01-21 DIAGNOSIS — M6281 Muscle weakness (generalized): Secondary | ICD-10-CM | POA: Diagnosis not present

## 2023-01-21 NOTE — Therapy (Signed)
OUTPATIENT PHYSICAL THERAPY TREATMENT NOTE   Patient Name: Cathy Vasquez MRN: [id number] DOB:July 17, 1991, 32 y.o., female Today's Date: 01/21/2023  PCP: Leamon Arnt, MD   REFERRING PROVIDER: Gregor Hams, MD  END OF SESSION:   PT End of Session - 01/21/23 0933     Visit Number 4    Number of Visits 12    Date for PT Re-Evaluation 03/06/23    Authorization Type [employer]    PT Start Time [phone]    PT Stop Time 1013    PT Time Calculation (min) 40 min             Past Medical History:  Diagnosis Date   Aortic aneurysm ([place])    Aortic stenosis    Atopic eczema 10/15/2018   Gastroesophageal reflux disease without esophagitis 12/29/2015   - discussed lifestyle changes - prescribed ranitidine 12/29/15 Rx Zantac 150 mg # 60 BID with refills x 5 given(01/19/16)   Heart disease    Heart murmur    Migraines    Past Surgical History:  Procedure Laterality Date   CARDIAC SURGERY  05/17/2003   Post Ross Procedure   ROSS [facility] PROCEDURE     VENOUS ABLATION Right 2020   varicose veins/venous insufficiency   WISDOM TOOTH EXTRACTION     Patient Active Problem List   Diagnosis Date Noted   SBE (subacute bacterial endocarditis) prophylaxis candidate 12/13/2022   Notalgia paresthetica 12/13/2022   DUB (dysfunctional uterine bleeding) 12/13/2022   Carotid occlusion, right 08/07/2022   Positive D dimer 06/12/2022   Migraine, ophthalmoplegic 11/09/2019   Migraine with aura    Atopic eczema 10/15/2018   Dyshidrotic eczema 10/15/2018   Venous insufficiency (chronic) (peripheral) 07/07/2018   Congenital aortic stenosis 11/07/2017   Non-rheumatic mitral regurgitation 03/31/2017   MVP (mitral valve prolapse) 03/31/2017   Ascending aorta dilatation ([place]) 03/28/2017   H/O Ross procedure 08/25/2016   Varicose veins of right leg with edema 07/25/2016    THERAPY DIAG:  Cervicalgia  Muscle weakness (generalized)  REFERRING DIAG: [id number] (ICD-10-CM) - Neck muscle  spasm M54.6 (ICD-10-CM) - Pain in thoracic spine   Rationale for Evaluation and Treatment: Rehabilitation   ONSET DATE: 2 years ago   SUBJECTIVE:                                                                                                                                                                                                          SUBJECTIVE STATEMENT: 01/21/2023 States that she had pain in her upper back/neck over the weekend.  Eval: States that initially she hurt her back after she went to a theme park and rode some spinning rides with her kids and carrying a backpack on her back. This eventually resolved so she didn't seek PT at the time. Pain returned last year 10/10/22 and continued for all the month of December. Pain is better now but she doesn't want pain to return. States she still has uneasiness with certain movements/activities.     States that it usually is painful on the right but sometimes it goes to the left. States she can't lay on the side that hurts. When she uses her arm it more she feels like it will set it off.  States she doesn't really exercise but wants to do better. Pain with breathing at times   PERTINENT HISTORY:  open heart surgeries x26 one at 64 weeks old and 32 years old-has, heart aneurysm. 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 no: NPRS scale: 0/10 Pain location: mid thoracic and radiates to neck and shoulders Both sides  Pain description: tender,sharp when it hurts, achy Aggravating factors: bending over into washer and reaching over head, pushign self off floor Relieving factors: heat, rest   PRECAUTIONS: None   WEIGHT BEARING RESTRICTIONS: No   FALLS:  Has patient fallen in last 6 months? No     OCCUPATION: stay at home mom, home schools    PLOF: Independent   PATIENT GOALS: reduce incidence of pain   NEXT MD VISIT:    OBJECTIVE:    DIAGNOSTIC FINDINGS:  Xray 12/23/22 T  spine FINDINGS: Curvature of the thoracolumbar spine, apex to the left. No other malalignment. No fractures. No significant degenerative changes. No other bony or soft tissue abnormalities.   IMPRESSION: Curvature of the thoracolumbar spine, apex to the right. No other abnormalities.     COGNITION: Overall cognitive status: Within functional limits for tasks assessed     POSTURE:  Observation: Rests neck in upper cervical ext, legs crossed, sacral sitter, thoracic extension and forward head noted   PALPATION: Increased tenderness to palpation along B Rhomboids and UT            CERVICAL ROM:    Active ROM A/PROM (deg) eval  Flexion WFL  Extension WFL  Right lateral flexion WFL  Left lateral flexion WFL  Right rotation WFL*  Left rotation WFL*   (Blank rows = not tested) *tight/tender on left side               UE Measurements       Upper Extremity Right 01/09/2023 Left 01/09/2023    A/PROM MMT A/PROM MMT  Shoulder Flexion 185 4 185 4  Shoulder Extension          Shoulder Abduction WFL 4 WFL 4  Shoulder Adduction          Shoulder Internal Rotation Reaches to T12 SP 4- Reaches to T6 SP (winging) 4-*  Shoulder External Rotation Reaches to T6 SP 4- Reaches T6 SP  4-  Elbow Flexion          Elbow Extension          Wrist Flexion          Wrist Extension          Wrist Supination          Wrist Pronation          Wrist Ulnar Deviation  Wrist Radial Deviation          Grip Strength NA   NA                          (Blank rows = not tested)                       * pain                       Right handed    CERVICAL SPECIAL TESTS:  Positive Spurling with left SB on left side in neck painful     TODAY'S TREATMENT:                                                                                                                              DATE: 01/21/2023  Therapeutic Exercise:    Aerobic: Supine: Prone: laying over small and large ball breathing to  promote thoracic flexion 10 minutes    Seated:    Standing: Neuromuscular Re-education seated posture with verbal and visual cues about head position and more active posture - 15 minutes, seated scapular protraction over ball with focus on shoulder blade expansion - 10 minutes, seated with towel roll in lumbar spine Manual Therapy: Therapeutic Activity: Self Care: Trigger Point Dry Needling:  Modalities:    PATIENT EDUCATION:  Education details: on anatomy and rationale behind interventions Person educated: Patient Education method: Explanation, Demonstration, and Handouts Education comprehension: verbalized understanding     HOME EXERCISE PROGRAM: [id number]   ASSESSMENT:   CLINICAL IMPRESSION: 01/21/2023 Session focused on education and improving sitting posture along with scapular protraction and thoracic flexion. Tolerated interventions well and improved posture noted with lumbar roll and visual feedback. Added seated ball exercise to HEP. Will continue with current POC as tolerated.   Eval: Patient presents with episodic mid thoracic pain that radiates up into neck and shoulder. Current episode is resolving but would like to address causes of her pain to reduce episodes and severity of pain. Patient demonstrates good cervical and UE motion but limitations in thoracic motion and generalized weakness throughout likely contributing to current condition. Patent's history of open heart surgery also likely contributing to current presentation. Patient would greatly benefit from skilled PT to improve overall function and QOL.    OBJECTIVE IMPAIRMENTS: decreased activity tolerance, decreased ROM, decreased strength, improper body mechanics, postural dysfunction, and pain.    ACTIVITY LIMITATIONS: lifting, bending, and reach over head   PARTICIPATION LIMITATIONS: meal prep, cleaning, laundry, and shopping   PERSONAL FACTORS: Age, Fitness, Time since onset of injury/illness/exacerbation,  and 1 comorbidity: hx of x2 open heart surgery (last when she was 32 years old)  are also affecting patient's functional outcome.    REHAB POTENTIAL: Good   CLINICAL DECISION MAKING: Stable/uncomplicated   EVALUATION COMPLEXITY: Low     GOALS: Goals reviewed with patient? yes  SHORT TERM GOALS: Target date: 02/06/2023  Patient will be independent in self management strategies to improve quality of life and functional outcomes. Baseline: New Program Goal status: INITIAL   2.  Patient will report at least 50% improvement in overall symptoms and/or function to demonstrate improved functional mobility Baseline: 0% better Goal status: INITIAL   3.  Patient will be able to lift plates into cabinet without pain or discomfort to improve ability to perform daily activities  Baseline: painful Goal status: INITIAL   4.  Patient will be able to demonstrate good diaphragmatic breath to reduce anterior/upper chest breathing  Baseline: chest breather Goal status: INITIAL       LONG TERM GOALS: Target date: 03/06/2023    Patient will report at least 75% improvement in overall symptoms and/or function to demonstrate improved functional mobility Baseline: 0% better Goal status: INITIAL   2.  Patient will be able to perform all daily tasks without pain or difficulty to improve overall function and QOL. Baseline: see above Goal status: INITIAL   3.  Patient will be able to demonstrate good active sitting posture to reduce incidence of pain Baseline: unable Goal status: INITIAL         PLAN:   PT FREQUENCY: 1-2x/week for total of 12 visits over 8 week certification    PT DURATION: 8 weeks   PLANNED INTERVENTIONS: Therapeutic exercises, Therapeutic activity, Neuromuscular re-education, Balance training, Gait training, Patient/Family education, Self Care, Joint mobilization, Joint manipulation, Vestibular training, Canalith repositioning, Orthotic/Fit training, DME instructions, Aquatic  Therapy, Dry Needling, Electrical stimulation, Spinal manipulation, Spinal mobilization, Cryotherapy, Moist heat, Traction, Ultrasound, Ionotophoresis 24m/ml Dexamethasone, Manual therapy, and Re-evaluation   PLAN FOR NEXT SESSION: belly breathing, f/u long exhale, prone breathing, chin tucks, scapular protraction     11:03 AM, 01/21/23 MJerene Pitch DPT Physical Therapy with [facility]

## 2023-01-28 ENCOUNTER — Ambulatory Visit: Payer: 59 | Admitting: Physical Therapy

## 2023-01-28 ENCOUNTER — Encounter: Payer: Self-pay | Admitting: Physical Therapy

## 2023-01-28 DIAGNOSIS — M542 Cervicalgia: Secondary | ICD-10-CM

## 2023-01-28 DIAGNOSIS — M6281 Muscle weakness (generalized): Secondary | ICD-10-CM

## 2023-01-28 NOTE — Therapy (Signed)
OUTPATIENT PHYSICAL THERAPY TREATMENT NOTE   Patient Name: Cathy Vasquez MRN: [id number] DOB:[birth year], 32 y.o., female Today's Date: 01/28/2023  PCP: Leamon Arnt, MD   REFERRING PROVIDER: Gregor Hams, MD  END OF SESSION:   PT End of Session - 01/28/23 0930     Visit Number 5    Number of Visits 12    Date for PT Re-Evaluation 03/06/23    Authorization Type [employer]    PT Start Time 0932    PT Stop Time 1010    PT Time Calculation (min) 38 min             Past Medical History:  Diagnosis Date   Aortic aneurysm ([place])    Aortic stenosis    Atopic eczema 10/15/2018   Gastroesophageal reflux disease without esophagitis 12/29/2015   - discussed lifestyle changes - prescribed ranitidine 12/29/15 Rx Zantac 150 mg # 60 BID with refills x 5 given(01/19/16)   Heart disease    Heart murmur    Migraines    Past Surgical History:  Procedure Laterality Date   CARDIAC SURGERY  05/17/2003   Post Ross Procedure   ROSS [facility] PROCEDURE     VENOUS ABLATION Right 2020   varicose veins/venous insufficiency   WISDOM TOOTH EXTRACTION     Patient Active Problem List   Diagnosis Date Noted   SBE (subacute bacterial endocarditis) prophylaxis candidate 12/13/2022   Notalgia paresthetica 12/13/2022   DUB (dysfunctional uterine bleeding) 12/13/2022   Carotid occlusion, right 08/07/2022   Positive D dimer 06/12/2022   Migraine, ophthalmoplegic 11/09/2019   Migraine with aura    Atopic eczema 10/15/2018   Dyshidrotic eczema 10/15/2018   Venous insufficiency (chronic) (peripheral) 07/07/2018   Congenital aortic stenosis 11/07/2017   Non-rheumatic mitral regurgitation 03/31/2017   MVP (mitral valve prolapse) 03/31/2017   Ascending aorta dilatation ([place]) 03/28/2017   H/O Ross procedure 08/25/2016   Varicose veins of right leg with edema 07/25/2016    THERAPY DIAG:  Cervicalgia  Muscle weakness (generalized)  REFERRING DIAG: [id number] (ICD-10-CM) - Neck muscle  spasm M54.6 (ICD-10-CM) - Pain in thoracic spine   Rationale for Evaluation and Treatment: Rehabilitation   ONSET DATE: 2 years ago   SUBJECTIVE:                                                                                                                                                                                                          SUBJECTIVE STATEMENT: 01/28/2023 States that she has been mindful of her posture. States she feels more  tightness on the right side of her neck. States she has not had any catches.   Eval: States that initially she hurt her back after she went to a theme park and rode some spinning rides with her kids and carrying a backpack on her back. This eventually resolved so she didn't seek PT at the time. Pain returned last year 10/10/22 and continued for all the month of December. Pain is better now but she doesn't want pain to return. States she still has uneasiness with certain movements/activities.     States that it usually is painful on the right but sometimes it goes to the left. States she can't lay on the side that hurts. When she uses her arm it more she feels like it will set it off.  States she doesn't really exercise but wants to do better. Pain with breathing at times   PERTINENT HISTORY:  open heart surgeries x73 one at 74 weeks old and 32 years old-has, heart aneurysm. 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 no: NPRS scale: 0/10 Pain location: mid thoracic and radiates to neck and shoulders Both sides  Pain description: tender,sharp when it hurts, achy Aggravating factors: bending over into washer and reaching over head, pushign self off floor Relieving factors: heat, rest   PRECAUTIONS: None   WEIGHT BEARING RESTRICTIONS: No   FALLS:  Has patient fallen in last 6 months? No     OCCUPATION: stay at home mom, home schools    PLOF: Independent   PATIENT GOALS: reduce incidence of pain    OBJECTIVE:     DIAGNOSTIC FINDINGS:  Xray 12/23/22 T spine FINDINGS: Curvature of the thoracolumbar spine, apex to the left. No other malalignment. No fractures. No significant degenerative changes. No other bony or soft tissue abnormalities.   IMPRESSION: Curvature of the thoracolumbar spine, apex to the right. No other abnormalities.     COGNITION: Overall cognitive status: Within functional limits for tasks assessed     POSTURE:  Observation: Rests neck in upper cervical ext, legs crossed, sacral sitter, thoracic extension and forward head noted   PALPATION: Increased tenderness to palpation along B Rhomboids and UT            CERVICAL ROM:    Active ROM A/PROM (deg) eval  Flexion WFL  Extension WFL  Right lateral flexion WFL  Left lateral flexion WFL  Right rotation WFL*  Left rotation WFL*   (Blank rows = not tested) *tight/tender on left side               UE Measurements       Upper Extremity Right 01/09/2023 Left 01/09/2023    A/PROM MMT A/PROM MMT  Shoulder Flexion 185 4 185 4  Shoulder Extension          Shoulder Abduction WFL 4 WFL 4  Shoulder Adduction          Shoulder Internal Rotation Reaches to T12 SP 4- Reaches to T6 SP (winging) 4-*  Shoulder External Rotation Reaches to T6 SP 4- Reaches T6 SP  4-  Elbow Flexion          Elbow Extension          Wrist Flexion          Wrist Extension          Wrist Supination          Wrist Pronation  Wrist Ulnar Deviation          Wrist Radial Deviation          Grip Strength NA   NA                          (Blank rows = not tested)                       * pain                       Right handed    CERVICAL SPECIAL TESTS:  Positive Spurling with left SB on left side in neck painful     TODAY'S TREATMENT:                                                                                                                              DATE: 01/28/2023  Therapeutic Exercise:    Aerobic: Supine: Prone:       Seated:    Standing: self mobilization 5 minutes with tennis ball  Neuromuscular Re-education prone breathing with tactile and verbal cues 5 minutes, scapular protraction prone tactile and verbal cues - difficult 5 minutes, prone plank (shortened) 5 minutes Manual Therapy: STM and IASTM to cervical/thoracic paraspinals - tolerated well, right scapular mobilization grade II/III - tolerated well  Therapeutic Activity: Self Care: Trigger Point Dry Needling:  Modalities: thermotherapy to neck during supine exercises.    PATIENT EDUCATION:  Education details: on HEP and rationale behind interventions, on how to safely use percussion gun Person educated: Patient Education method: Explanation, Demonstration, and Handouts Education comprehension: verbalized understanding     HOME EXERCISE PROGRAM: [id number]   ASSESSMENT:   CLINICAL IMPRESSION: 01/28/2023 Session continued to focus on manual work and posture exercises. Tolerated moderately well but painful and difficult for patient ot perform prone exercises. Will start adding in more strengthening exercises next session. Will continue with current POC as tolerated.   Eval: Patient presents with episodic mid thoracic pain that radiates up into neck and shoulder. Current episode is resolving but would like to address causes of her pain to reduce episodes and severity of pain. Patient demonstrates good cervical and UE motion but limitations in thoracic motion and generalized weakness throughout likely contributing to current condition. Patent's history of open heart surgery also likely contributing to current presentation. Patient would greatly benefit from skilled PT to improve overall function and QOL.    OBJECTIVE IMPAIRMENTS: decreased activity tolerance, decreased ROM, decreased strength, improper body mechanics, postural dysfunction, and pain.    ACTIVITY LIMITATIONS: lifting, bending, and reach over head   PARTICIPATION LIMITATIONS: meal  prep, cleaning, laundry, and shopping   PERSONAL FACTORS: Age, Fitness, Time since onset of injury/illness/exacerbation, and 1 comorbidity: hx of x2 open heart surgery (last when she was 32 years old)  are also affecting patient's functional outcome.  REHAB POTENTIAL: Good   CLINICAL DECISION MAKING: Stable/uncomplicated   EVALUATION COMPLEXITY: Low     GOALS: Goals reviewed with patient? yes   SHORT TERM GOALS: Target date: 02/06/2023  Patient will be independent in self management strategies to improve quality of life and functional outcomes. Baseline: New Program Goal status: INITIAL   2.  Patient will report at least 50% improvement in overall symptoms and/or function to demonstrate improved functional mobility Baseline: 0% better Goal status: INITIAL   3.  Patient will be able to lift plates into cabinet without pain or discomfort to improve ability to perform daily activities  Baseline: painful Goal status: INITIAL   4.  Patient will be able to demonstrate good diaphragmatic breath to reduce anterior/upper chest breathing  Baseline: chest breather Goal status: INITIAL       LONG TERM GOALS: Target date: 03/06/2023    Patient will report at least 75% improvement in overall symptoms and/or function to demonstrate improved functional mobility Baseline: 0% better Goal status: INITIAL   2.  Patient will be able to perform all daily tasks without pain or difficulty to improve overall function and QOL. Baseline: see above Goal status: INITIAL   3.  Patient will be able to demonstrate good active sitting posture to reduce incidence of pain Baseline: unable Goal status: INITIAL         PLAN:   PT FREQUENCY: 1-2x/week for total of 12 visits over 8 week certification    PT DURATION: 8 weeks   PLANNED INTERVENTIONS: Therapeutic exercises, Therapeutic activity, Neuromuscular re-education, Balance training, Gait training, Patient/Family education, Self Care, Joint  mobilization, Joint manipulation, Vestibular training, Canalith repositioning, Orthotic/Fit training, DME instructions, Aquatic Therapy, Dry Needling, Electrical stimulation, Spinal manipulation, Spinal mobilization, Cryotherapy, Moist heat, Traction, Ultrasound, Ionotophoresis 4m/ml Dexamethasone, Manual therapy, and Re-evaluation   PLAN FOR NEXT SESSION: quadruped, cat/cow, theraband shoulder exercises. Seated ball exercises     11:28 AM, 01/28/23 MJerene Pitch DPT [facility]

## 2023-02-04 ENCOUNTER — Ambulatory Visit: Payer: 59 | Admitting: Physical Therapy

## 2023-02-04 ENCOUNTER — Encounter: Payer: Self-pay | Admitting: Physical Therapy

## 2023-02-04 DIAGNOSIS — M542 Cervicalgia: Secondary | ICD-10-CM

## 2023-02-04 DIAGNOSIS — M6281 Muscle weakness (generalized): Secondary | ICD-10-CM

## 2023-02-04 NOTE — Therapy (Signed)
OUTPATIENT PHYSICAL THERAPY TREATMENT NOTE   Patient Name: Cathy Vasquez MRN: [id number] DOB:11-03-1991, 32 y.o., female Today's Date: 02/04/2023  PCP: Leamon Arnt, MD   REFERRING PROVIDER: Gregor Hams, MD  END OF SESSION:   PT End of Session - 02/04/23 1215     Visit Number 6    Number of Visits 12    Date for PT Re-Evaluation 03/06/23    Authorization Type [employer]    PT Start Time 1215    PT Stop Time 1255    PT Time Calculation (min) 40 min             Past Medical History:  Diagnosis Date   Aortic aneurysm ([place])    Aortic stenosis    Atopic eczema 10/15/2018   Gastroesophageal reflux disease without esophagitis 12/29/2015   - discussed lifestyle changes - prescribed ranitidine 12/29/15 Rx Zantac 150 mg # 60 BID with refills x 5 given(01/19/16)   Heart disease    Heart murmur    Migraines    Past Surgical History:  Procedure Laterality Date   CARDIAC SURGERY  05/17/2003   Post Ross Procedure   ROSS [facility] PROCEDURE     VENOUS ABLATION Right 2020   varicose veins/venous insufficiency   WISDOM TOOTH EXTRACTION     Patient Active Problem List   Diagnosis Date Noted   SBE (subacute bacterial endocarditis) prophylaxis candidate 12/13/2022   Notalgia paresthetica 12/13/2022   DUB (dysfunctional uterine bleeding) 12/13/2022   Carotid occlusion, right 08/07/2022   Positive D dimer 06/12/2022   Migraine, ophthalmoplegic 11/09/2019   Migraine with aura    Atopic eczema 10/15/2018   Dyshidrotic eczema 10/15/2018   Venous insufficiency (chronic) (peripheral) 07/07/2018   Congenital aortic stenosis 11/07/2017   Non-rheumatic mitral regurgitation 03/31/2017   MVP (mitral valve prolapse) 03/31/2017   Ascending aorta dilatation ([place]) 03/28/2017   H/O Ross procedure 08/25/2016   Varicose veins of right leg with edema 07/25/2016    THERAPY DIAG:  Cervicalgia  Muscle weakness (generalized)  REFERRING DIAG: [id number] (ICD-10-CM) - Neck muscle  spasm M54.6 (ICD-10-CM) - Pain in thoracic spine   Rationale for Evaluation and Treatment: Rehabilitation   ONSET DATE: 2 years ago   SUBJECTIVE:                                                                                                                                                                                                          SUBJECTIVE STATEMENT: 02/04/2023 States that she hasn't been hurting. States that yesterday she played ping pong  and had some increase in pain in her that one spot. States she had more soreness on the right side   Eval: States that initially she hurt her back after she went to a theme park and rode some spinning rides with her kids and carrying a backpack on her back. This eventually resolved so she didn't seek PT at the time. Pain returned last year 10/10/22 and continued for all the month of December. Pain is better now but she doesn't want pain to return. States she still has uneasiness with certain movements/activities.     States that it usually is painful on the right but sometimes it goes to the left. States she can't lay on the side that hurts. When she uses her arm it more she feels like it will set it off.  States she doesn't really exercise but wants to do better. Pain with breathing at times   PERTINENT HISTORY:  open heart surgeries x43 one at 50 weeks old and 32 years old-has, heart aneurysm. 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 yes: NPRS scale: 1-2/10 Pain location: mid thoracic and radiates to neck and shoulders Both sides  Pain description: present aware of pain Aggravating factors: bending over into washer and reaching over head, pushign self off floor Relieving factors: heat, rest   PRECAUTIONS: None   WEIGHT BEARING RESTRICTIONS: No   FALLS:  Has patient fallen in last 6 months? No     OCCUPATION: stay at home mom, home schools    PLOF: Independent   PATIENT GOALS: reduce incidence of  pain    OBJECTIVE:    DIAGNOSTIC FINDINGS:  Xray 12/23/22 T spine FINDINGS: Curvature of the thoracolumbar spine, apex to the left. No other malalignment. No fractures. No significant degenerative changes. No other bony or soft tissue abnormalities.   IMPRESSION: Curvature of the thoracolumbar spine, apex to the right. No other abnormalities.     COGNITION: Overall cognitive status: Within functional limits for tasks assessed     POSTURE:  Observation: Rests neck in upper cervical ext, legs crossed, sacral sitter, thoracic extension and forward head noted   PALPATION: Increased tenderness to palpation along B Rhomboids and UT            CERVICAL ROM:    Active ROM A/PROM (deg) eval  Flexion WFL  Extension WFL  Right lateral flexion WFL  Left lateral flexion WFL  Right rotation WFL*  Left rotation WFL*   (Blank rows = not tested) *tight/tender on left side               UE Measurements       Upper Extremity Right 01/09/2023 Left 01/09/2023    A/PROM MMT A/PROM MMT  Shoulder Flexion 185 4 185 4  Shoulder Extension          Shoulder Abduction WFL 4 WFL 4  Shoulder Adduction          Shoulder Internal Rotation Reaches to T12 SP 4- Reaches to T6 SP (winging) 4-*  Shoulder External Rotation Reaches to T6 SP 4- Reaches T6 SP  4-  Elbow Flexion          Elbow Extension          Wrist Flexion          Wrist Extension          Wrist Supination          Wrist Pronation  Wrist Ulnar Deviation          Wrist Radial Deviation          Grip Strength NA   NA                          (Blank rows = not tested)                       * pain                       Right handed    CERVICAL SPECIAL TESTS:  Positive Spurling with left SB on left side in neck painful     TODAY'S TREATMENT:                                                                                                                              DATE: 02/04/2023  Therapeutic Exercise:     Aerobic: Supine: Prone:  I's 3x5, row 3x5 unilaterally performed B, swimming UE arm motions B 8 minutes with deep breathing, lat stretch R x10 10" holds    Seated:    Standing:  Neuromuscular Re-education  Manual Therapy: STM to left paraspinal and rhomboids and right lat/UT - tolerated well  Self Care: Trigger Point Dry Needling:  Modalities:    PATIENT EDUCATION:  Education details: on HEP  Person educated: Patient Education method: Explanation, Media planner, and Handouts Education comprehension: verbalized understanding     HOME EXERCISE PROGRAM: [id number]   ASSESSMENT:   CLINICAL IMPRESSION: 02/04/2023 Focused on prone exercises and shoulder mobility and strength. Tolerated well but right more limited on right UE with reaching behind back. This improved with repetition. Patient with tendency to roll shoulder forward with all backwards  motion. No pain just tightness in right UT noted during session. Will continue with current POC as tolerated.   Eval: Patient presents with episodic mid thoracic pain that radiates up into neck and shoulder. Current episode is resolving but would like to address causes of her pain to reduce episodes and severity of pain. Patient demonstrates good cervical and UE motion but limitations in thoracic motion and generalized weakness throughout likely contributing to current condition. Patent's history of open heart surgery also likely contributing to current presentation. Patient would greatly benefit from skilled PT to improve overall function and QOL.    OBJECTIVE IMPAIRMENTS: decreased activity tolerance, decreased ROM, decreased strength, improper body mechanics, postural dysfunction, and pain.    ACTIVITY LIMITATIONS: lifting, bending, and reach over head   PARTICIPATION LIMITATIONS: meal prep, cleaning, laundry, and shopping   PERSONAL FACTORS: Age, Fitness, Time since onset of injury/illness/exacerbation, and 1 comorbidity: hx of x2 open heart  surgery (last when she was 32 years old)  are also affecting patient's functional outcome.    REHAB POTENTIAL: Good   CLINICAL DECISION MAKING: Stable/uncomplicated   EVALUATION COMPLEXITY: Low  GOALS: Goals reviewed with patient? yes   SHORT TERM GOALS: Target date: 02/06/2023  Patient will be independent in self management strategies to improve quality of life and functional outcomes. Baseline: New Program Goal status: INITIAL   2.  Patient will report at least 50% improvement in overall symptoms and/or function to demonstrate improved functional mobility Baseline: 0% better Goal status: INITIAL   3.  Patient will be able to lift plates into cabinet without pain or discomfort to improve ability to perform daily activities  Baseline: painful Goal status: INITIAL   4.  Patient will be able to demonstrate good diaphragmatic breath to reduce anterior/upper chest breathing  Baseline: chest breather Goal status: INITIAL       LONG TERM GOALS: Target date: 03/06/2023    Patient will report at least 75% improvement in overall symptoms and/or function to demonstrate improved functional mobility Baseline: 0% better Goal status: INITIAL   2.  Patient will be able to perform all daily tasks without pain or difficulty to improve overall function and QOL. Baseline: see above Goal status: INITIAL   3.  Patient will be able to demonstrate good active sitting posture to reduce incidence of pain Baseline: unable Goal status: INITIAL         PLAN:   PT FREQUENCY: 1-2x/week for total of 12 visits over 8 week certification    PT DURATION: 8 weeks   PLANNED INTERVENTIONS: Therapeutic exercises, Therapeutic activity, Neuromuscular re-education, Balance training, Gait training, Patient/Family education, Self Care, Joint mobilization, Joint manipulation, Vestibular training, Canalith repositioning, Orthotic/Fit training, DME instructions, Aquatic Therapy, Dry Needling, Electrical  stimulation, Spinal manipulation, Spinal mobilization, Cryotherapy, Moist heat, Traction, Ultrasound, Ionotophoresis '4mg'$ /ml Dexamethasone, Manual therapy, and Re-evaluation   PLAN FOR NEXT SESSION: quadruped, cat/cow, theraband shoulder exercises. Seated ball exercises     2:04 PM, 02/04/23 Jerene Pitch, DPT Physical Therapy with [facility]

## 2023-02-10 ENCOUNTER — Ambulatory Visit: Payer: 59 | Admitting: Physician Assistant

## 2023-02-11 ENCOUNTER — Encounter: Payer: Self-pay | Admitting: Physical Therapy

## 2023-02-11 ENCOUNTER — Ambulatory Visit: Payer: 59 | Admitting: Physical Therapy

## 2023-02-11 DIAGNOSIS — M6281 Muscle weakness (generalized): Secondary | ICD-10-CM

## 2023-02-11 DIAGNOSIS — M542 Cervicalgia: Secondary | ICD-10-CM

## 2023-02-11 NOTE — Therapy (Signed)
OUTPATIENT PHYSICAL THERAPY TREATMENT NOTE   Patient Name: Cathy Vasquez MRN: [id number] DOB:1991/10/15, 32 y.o., female Today's Date: 02/11/2023  PCP: Leamon Arnt, MD   REFERRING PROVIDER: Gregor Hams, MD  END OF SESSION:   PT End of Session - 02/11/23 0932     Visit Number 7    Number of Visits 12    Date for PT Re-Evaluation 03/06/23    Authorization Type [employer]    PT Start Time [phone]    PT Stop Time 1018    PT Time Calculation (min) 41 min             Past Medical History:  Diagnosis Date   Aortic aneurysm ([place])    Aortic stenosis    Atopic eczema 10/15/2018   Gastroesophageal reflux disease without esophagitis 12/29/2015   - discussed lifestyle changes - prescribed ranitidine 12/29/15 Rx Zantac 150 mg # 60 BID with refills x 5 given(01/19/16)   Heart disease    Heart murmur    Migraines    Past Surgical History:  Procedure Laterality Date   CARDIAC SURGERY  05/17/2003   Post Ross Procedure   [facility] PROCEDURE     VENOUS ABLATION Right 2020   varicose veins/venous insufficiency   WISDOM TOOTH EXTRACTION     Patient Active Problem List   Diagnosis Date Noted   SBE (subacute bacterial endocarditis) prophylaxis candidate 12/13/2022   Notalgia paresthetica 12/13/2022   DUB (dysfunctional uterine bleeding) 12/13/2022   Carotid occlusion, right 08/07/2022   Positive D dimer 06/12/2022   Migraine, ophthalmoplegic 11/09/2019   Migraine with aura    Atopic eczema 10/15/2018   Dyshidrotic eczema 10/15/2018   Venous insufficiency (chronic) (peripheral) 07/07/2018   Congenital aortic stenosis 11/07/2017   Non-rheumatic mitral regurgitation 03/31/2017   MVP (mitral valve prolapse) 03/31/2017   Ascending aorta dilatation ([place]) 03/28/2017   H/O Ross procedure 08/25/2016   Varicose veins of right leg with edema 07/25/2016    THERAPY DIAG:  Cervicalgia  Muscle weakness (generalized)  REFERRING DIAG: [id number] (ICD-10-CM) - Neck muscle  spasm M54.6 (ICD-10-CM) - Pain in thoracic spine   Rationale for Evaluation and Treatment: Rehabilitation   ONSET DATE: 2 years ago   SUBJECTIVE:                                                                                                                                                                                                          SUBJECTIVE STATEMENT: 02/11/2023 States that she has been tired and has been more fatigued. States she  has been a little more relaxed but still having some times when she has to focus on posture.   Eval: States that initially she hurt her back after she went to a theme park and rode some spinning rides with her kids and carrying a backpack on her back. This eventually resolved so she didn't seek PT at the time. Pain returned last year 10/10/22 and continued for all the month of December. Pain is better now but she doesn't want pain to return. States she still has uneasiness with certain movements/activities.     States that it usually is painful on the right but sometimes it goes to the left. States she can't lay on the side that hurts. When she uses her arm it more she feels like it will set it off.  States she doesn't really exercise but wants to do better. Pain with breathing at times   PERTINENT HISTORY:  open heart surgeries x77 one at 68 weeks old and 32 years old-has, heart aneurysm. 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 yes: NPRS scale: 1-2/10 Pain location: mid thoracic and radiates to neck and shoulders Both sides  Pain description: present aware of pain Aggravating factors: bending over into washer and reaching over head, pushign self off floor Relieving factors: heat, rest   PRECAUTIONS: None   WEIGHT BEARING RESTRICTIONS: No   FALLS:  Has patient fallen in last 6 months? No     OCCUPATION: stay at home mom, home schools    PLOF: Independent   PATIENT GOALS: reduce incidence of pain     OBJECTIVE:    DIAGNOSTIC FINDINGS:  Xray 12/23/22 T spine FINDINGS: Curvature of the thoracolumbar spine, apex to the left. No other malalignment. No fractures. No significant degenerative changes. No other bony or soft tissue abnormalities.   IMPRESSION: Curvature of the thoracolumbar spine, apex to the right. No other abnormalities.     COGNITION: Overall cognitive status: Within functional limits for tasks assessed     POSTURE:  Observation: Rests neck in upper cervical ext, legs crossed, sacral sitter, thoracic extension and forward head noted   PALPATION: Increased tenderness to palpation along B Rhomboids and UT            CERVICAL ROM:    Active ROM A/PROM (deg) eval  Flexion WFL  Extension WFL  Right lateral flexion WFL  Left lateral flexion WFL  Right rotation WFL*  Left rotation WFL*   (Blank rows = not tested) *tight/tender on left side               UE Measurements       Upper Extremity Right 01/09/2023 Left 01/09/2023    A/PROM MMT A/PROM MMT  Shoulder Flexion 185 4 185 4  Shoulder Extension          Shoulder Abduction WFL 4 WFL 4  Shoulder Adduction          Shoulder Internal Rotation Reaches to T12 SP 4- Reaches to T6 SP (winging) 4-*  Shoulder External Rotation Reaches to T6 SP 4- Reaches T6 SP  4-  Elbow Flexion          Elbow Extension          Wrist Flexion          Wrist Extension          Wrist Supination          Wrist Pronation  Wrist Ulnar Deviation          Wrist Radial Deviation          Grip Strength NA   NA                          (Blank rows = not tested)                       * pain                       Right handed    CERVICAL SPECIAL TESTS:  Positive Spurling with left SB on left side in neck painful     TODAY'S TREATMENT:                                                                                                                              DATE: 02/11/2023  Therapeutic Exercise:    Aerobic: Supine: pec  stretch butterfly 2 minutes 5" holds, shoulder IR 4 minutes B- each upped to 2# weights  Prone:  I's and shoulder swimmers - review and practice B - 10 minutes    Standing: wall pec stretch 5 minutes B Neuromuscular Re-education  Manual Therapy: AP to right GHJ grade II/III tolerated well  Self Care: Trigger Point Dry Needling:  Modalities:    PATIENT EDUCATION:  Education details: on HEP  Person educated: Patient Education method: Consulting civil engineer, Media planner, and Handouts Education comprehension: verbalized understanding     HOME EXERCISE PROGRAM: [id number]   ASSESSMENT:   CLINICAL IMPRESSION: 02/11/2023 Focused on right shoulder internal rotation and pec stretches today secondary to limitations in right shoulder motion especially with reaching behind back.  Tolerated all interventions well and slight improvement in motion when reaching behind back but continues to have glenohumeral joint restrictions.  No pain noted today we will continue with current plan of care as tolerated.  Eval: Patient presents with episodic mid thoracic pain that radiates up into neck and shoulder. Current episode is resolving but would like to address causes of her pain to reduce episodes and severity of pain. Patient demonstrates good cervical and UE motion but limitations in thoracic motion and generalized weakness throughout likely contributing to current condition. Patent's history of open heart surgery also likely contributing to current presentation. Patient would greatly benefit from skilled PT to improve overall function and QOL.    OBJECTIVE IMPAIRMENTS: decreased activity tolerance, decreased ROM, decreased strength, improper body mechanics, postural dysfunction, and pain.    ACTIVITY LIMITATIONS: lifting, bending, and reach over head   PARTICIPATION LIMITATIONS: meal prep, cleaning, laundry, and shopping   PERSONAL FACTORS: Age, Fitness, Time since onset of injury/illness/exacerbation, and 1  comorbidity: hx of x2 open heart surgery (last when she was 33 years old)  are also affecting patient's functional outcome.    REHAB POTENTIAL: Good   CLINICAL DECISION MAKING: Stable/uncomplicated  EVALUATION COMPLEXITY: Low     GOALS: Goals reviewed with patient? yes   SHORT TERM GOALS: Target date: 02/06/2023  Patient will be independent in self management strategies to improve quality of life and functional outcomes. Baseline: New Program Goal status: INITIAL   2.  Patient will report at least 50% improvement in overall symptoms and/or function to demonstrate improved functional mobility Baseline: 0% better Goal status: INITIAL   3.  Patient will be able to lift plates into cabinet without pain or discomfort to improve ability to perform daily activities  Baseline: painful Goal status: INITIAL   4.  Patient will be able to demonstrate good diaphragmatic breath to reduce anterior/upper chest breathing  Baseline: chest breather Goal status: INITIAL       LONG TERM GOALS: Target date: 03/06/2023    Patient will report at least 75% improvement in overall symptoms and/or function to demonstrate improved functional mobility Baseline: 0% better Goal status: INITIAL   2.  Patient will be able to perform all daily tasks without pain or difficulty to improve overall function and QOL. Baseline: see above Goal status: INITIAL   3.  Patient will be able to demonstrate good active sitting posture to reduce incidence of pain Baseline: unable Goal status: INITIAL         PLAN:   PT FREQUENCY: 1-2x/week for total of 12 visits over 8 week certification    PT DURATION: 8 weeks   PLANNED INTERVENTIONS: Therapeutic exercises, Therapeutic activity, Neuromuscular re-education, Balance training, Gait training, Patient/Family education, Self Care, Joint mobilization, Joint manipulation, Vestibular training, Canalith repositioning, Orthotic/Fit training, DME instructions, Aquatic  Therapy, Dry Needling, Electrical stimulation, Spinal manipulation, Spinal mobilization, Cryotherapy, Moist heat, Traction, Ultrasound, Ionotophoresis '4mg'$ /ml Dexamethasone, Manual therapy, and Re-evaluation   PLAN FOR NEXT SESSION: quadruped, cat/cow, theraband shoulder exercises. Seated ball exercises     10:28 AM, 02/11/23 Jerene Pitch, DPT Physical Therapy with

## 2023-02-18 ENCOUNTER — Encounter: Payer: Self-pay | Admitting: Physical Therapy

## 2023-02-18 ENCOUNTER — Ambulatory Visit: Payer: 59 | Admitting: Physical Therapy

## 2023-02-18 DIAGNOSIS — M542 Cervicalgia: Secondary | ICD-10-CM

## 2023-02-18 DIAGNOSIS — M6281 Muscle weakness (generalized): Secondary | ICD-10-CM | POA: Diagnosis not present

## 2023-02-18 NOTE — Therapy (Signed)
OUTPATIENT PHYSICAL THERAPY TREATMENT NOTE   Patient Name: Cathy Vasquez MRN: [id number] DOB:1991-10-25, 32 y.o., female Today's Date: 02/18/2023  PCP: Leamon Arnt, MD   REFERRING PROVIDER: Gregor Hams, MD  END OF SESSION:   PT End of Session - 02/18/23 0930     Visit Number 8    Number of Visits 12    Date for PT Re-Evaluation 03/06/23    Authorization Type [employer]    PT Start Time 0932    PT Stop Time 1012    PT Time Calculation (min) 40 min             Past Medical History:  Diagnosis Date   Aortic aneurysm ([place])    Aortic stenosis    Atopic eczema 10/15/2018   Gastroesophageal reflux disease without esophagitis 12/29/2015   - discussed lifestyle changes - prescribed ranitidine 12/29/15 Rx Zantac 150 mg # 60 BID with refills x 5 given(01/19/16)   Heart disease    Heart murmur    Migraines    Past Surgical History:  Procedure Laterality Date   CARDIAC SURGERY  05/17/2003   Post Ross Procedure   [facility] PROCEDURE     VENOUS ABLATION Right 2020   varicose veins/venous insufficiency   WISDOM TOOTH EXTRACTION     Patient Active Problem List   Diagnosis Date Noted   SBE (subacute bacterial endocarditis) prophylaxis candidate 12/13/2022   Notalgia paresthetica 12/13/2022   DUB (dysfunctional uterine bleeding) 12/13/2022   Carotid occlusion, right 08/07/2022   Positive D dimer 06/12/2022   Migraine, ophthalmoplegic 11/09/2019   Migraine with aura    Atopic eczema 10/15/2018   Dyshidrotic eczema 10/15/2018   Venous insufficiency (chronic) (peripheral) 07/07/2018   Congenital aortic stenosis 11/07/2017   Non-rheumatic mitral regurgitation 03/31/2017   MVP (mitral valve prolapse) 03/31/2017   Ascending aorta dilatation ([place]) 03/28/2017   H/O Ross procedure 08/25/2016   Varicose veins of right leg with edema 07/25/2016    THERAPY DIAG:  Cervicalgia  Muscle weakness (generalized)  REFERRING DIAG: [id number] (ICD-10-CM) - Neck muscle  spasm M54.6 (ICD-10-CM) - Pain in thoracic spine   Rationale for Evaluation and Treatment: Rehabilitation   ONSET DATE: 2 years ago   SUBJECTIVE:                                                                                                                                                                                                          SUBJECTIVE STATEMENT: 02/18/2023 States that she is feeling like she is a little tight but feels  like it is slowly but surely getting better. States she is still tired. Reports no base line pain or tightness without movement.   Eval: States that initially she hurt her back after she went to a theme park and rode some spinning rides with her kids and carrying a backpack on her back. This eventually resolved so she didn't seek PT at the time. Pain returned last year 10/10/22 and continued for all the month of December. Pain is better now but she doesn't want pain to return. States she still has uneasiness with certain movements/activities.     States that it usually is painful on the right but sometimes it goes to the left. States she can't lay on the side that hurts. When she uses her arm it more she feels like it will set it off.  States she doesn't really exercise but wants to do better. Pain with breathing at times   PERTINENT HISTORY:  open heart surgeries x33 one at 7 weeks old and 32 years old-has, heart aneurysm. 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 no: NPRS scale: 0/10 Pain location: mid thoracic and radiates to neck and shoulders Both sides  Pain description: present aware of pain Aggravating factors: bending over into washer and reaching over head, pushign self off floor Relieving factors: heat, rest   PRECAUTIONS: None   WEIGHT BEARING RESTRICTIONS: No   FALLS:  Has patient fallen in last 6 months? No     OCCUPATION: stay at home mom, home schools    PLOF: Independent   PATIENT GOALS: reduce  incidence of pain    OBJECTIVE:    DIAGNOSTIC FINDINGS:  Xray 12/23/22 T spine FINDINGS: Curvature of the thoracolumbar spine, apex to the left. No other malalignment. No fractures. No significant degenerative changes. No other bony or soft tissue abnormalities.   IMPRESSION: Curvature of the thoracolumbar spine, apex to the right. No other abnormalities.     COGNITION: Overall cognitive status: Within functional limits for tasks assessed     POSTURE:  Observation: Rests neck in upper cervical ext, legs crossed, sacral sitter, thoracic extension and forward head noted   PALPATION: Increased tenderness to palpation along B Rhomboids and UT            CERVICAL ROM:    Active ROM A/PROM (deg) eval  Flexion WFL  Extension WFL  Right lateral flexion WFL  Left lateral flexion WFL  Right rotation WFL*  Left rotation WFL*   (Blank rows = not tested) *tight/tender on left side               UE Measurements       Upper Extremity Right 01/09/2023 Left 01/09/2023    A/PROM MMT A/PROM MMT  Shoulder Flexion 185 4 185 4  Shoulder Extension          Shoulder Abduction WFL 4 WFL 4  Shoulder Adduction          Shoulder Internal Rotation Reaches to T12 SP 4- Reaches to T6 SP (winging) 4-*  Shoulder External Rotation Reaches to T6 SP 4- Reaches T6 SP  4-  Elbow Flexion          Elbow Extension          Wrist Flexion          Wrist Extension          Wrist Supination          Wrist Pronation  Wrist Ulnar Deviation          Wrist Radial Deviation          Grip Strength NA   NA                          (Blank rows = not tested)                       * pain                       Right handed    CERVICAL SPECIAL TESTS:  Positive Spurling with left SB on left side in neck painful     TODAY'S TREATMENT:                                                                                                                              DATE: 02/18/2023  Therapeutic Exercise:     Aerobic: Supine: on floor T pec stretch 2 minutes, T position with shoulder IR/ER 2 minutes B, neck ROT in t position 3 minutes B    Prone: T position breathing 2 minutes, UE reverse snow angel arms on ground 2 minutes, arms up - 2x10B    Standing: wall pec stretch x2 B, shoulder flexion slides in pillow case 2x5 B Neuromuscular Re-education  Manual Therapy: AP to right GHJ grade II/III tolerated well  Self Care: Trigger Point Dry Needling:  Modalities:    PATIENT EDUCATION:  Education details: on HEP  Person educated: Patient Education method: Explanation, Media planner, and Handouts Education comprehension: verbalized understanding     HOME EXERCISE PROGRAM: [id number]   ASSESSMENT:   CLINICAL IMPRESSION: 02/18/2023 Session focused on mat exercises in prone and supine, tolerated well. No pain but stretching and fatigue with all exercises. Added new exercises to HEP. Overall patient is doing very well and would continue to benefit from skilled PT pat this time.   Eval: Patient presents with episodic mid thoracic pain that radiates up into neck and shoulder. Current episode is resolving but would like to address causes of her pain to reduce episodes and severity of pain. Patient demonstrates good cervical and UE motion but limitations in thoracic motion and generalized weakness throughout likely contributing to current condition. Patent's history of open heart surgery also likely contributing to current presentation. Patient would greatly benefit from skilled PT to improve overall function and QOL.    OBJECTIVE IMPAIRMENTS: decreased activity tolerance, decreased ROM, decreased strength, improper body mechanics, postural dysfunction, and pain.    ACTIVITY LIMITATIONS: lifting, bending, and reach over head   PARTICIPATION LIMITATIONS: meal prep, cleaning, laundry, and shopping   PERSONAL FACTORS: Age, Fitness, Time since onset of injury/illness/exacerbation, and 1 comorbidity: hx of  x2 open heart surgery (last when she was 32 years old)  are also affecting patient's functional outcome.    REHAB POTENTIAL: Good  CLINICAL DECISION MAKING: Stable/uncomplicated   EVALUATION COMPLEXITY: Low     GOALS: Goals reviewed with patient? yes   SHORT TERM GOALS: Target date: 02/06/2023  Patient will be independent in self management strategies to improve quality of life and functional outcomes. Baseline: New Program Goal status: INITIAL   2.  Patient will report at least 50% improvement in overall symptoms and/or function to demonstrate improved functional mobility Baseline: 0% better Goal status: INITIAL   3.  Patient will be able to lift plates into cabinet without pain or discomfort to improve ability to perform daily activities  Baseline: painful Goal status: INITIAL   4.  Patient will be able to demonstrate good diaphragmatic breath to reduce anterior/upper chest breathing  Baseline: chest breather Goal status: INITIAL       LONG TERM GOALS: Target date: 03/06/2023    Patient will report at least 75% improvement in overall symptoms and/or function to demonstrate improved functional mobility Baseline: 0% better Goal status: INITIAL   2.  Patient will be able to perform all daily tasks without pain or difficulty to improve overall function and QOL. Baseline: see above Goal status: INITIAL   3.  Patient will be able to demonstrate good active sitting posture to reduce incidence of pain Baseline: unable Goal status: INITIAL         PLAN:   PT FREQUENCY: 1-2x/week for total of 12 visits over 8 week certification    PT DURATION: 8 weeks   PLANNED INTERVENTIONS: Therapeutic exercises, Therapeutic activity, Neuromuscular re-education, Balance training, Gait training, Patient/Family education, Self Care, Joint mobilization, Joint manipulation, Vestibular training, Canalith repositioning, Orthotic/Fit training, DME instructions, Aquatic Therapy, Dry Needling,  Electrical stimulation, Spinal manipulation, Spinal mobilization, Cryotherapy, Moist heat, Traction, Ultrasound, Ionotophoresis '4mg'$ /ml Dexamethasone, Manual therapy, and Re-evaluation   PLAN FOR NEXT SESSION: quadruped, cat/cow, theraband shoulder exercises. Seated ball exercises     10:15 AM, 02/18/23 Jerene Pitch, DPT Physical Therapy with [facility]

## 2023-02-21 IMAGING — CT CT ANGIO HEAD-NECK (W OR W/O PERF)
2 of 7 series · 8 of 33 positions shown · non-contrast
Comparison: Prior head CT from earlier the same day.

CLINICAL DATA: Initial evaluation for acute headache.

EXAM:
CT ANGIOGRAPHY HEAD AND NECK
TECHNIQUE: Multidetector CT imaging of the head and neck was performed using
the standard protocol during bolus administration of intravenous
contrast. Multiplanar CT image reconstructions and MIPs were
obtained to evaluate the vascular anatomy. Carotid stenosis
measurements (when applicable) are obtained utilizing NASCET
criteria, using the distal internal carotid diameter as the
denominator.

[Series 5: cta head · axial · 0.49mm/px · z∈[+927,+1035]mm · 2 of 162 slices shown]
[im 54/162  soft-tissue]
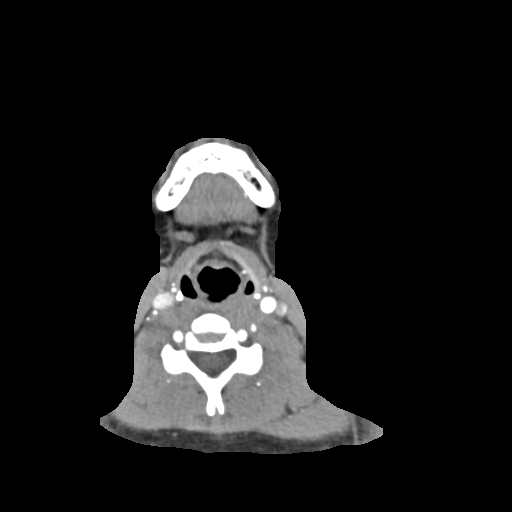
[im 108/162  soft-tissue]
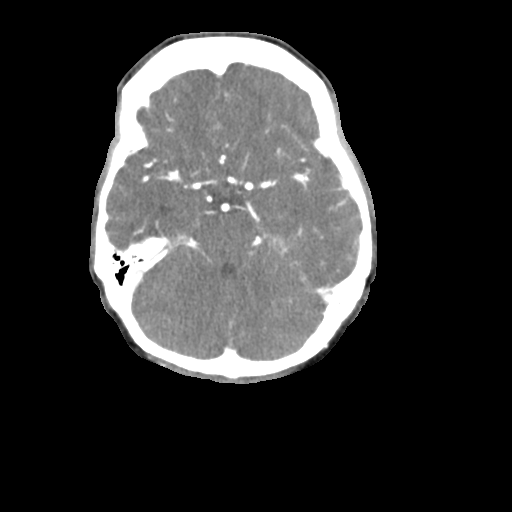

[Series 7: ax thin · axial · 0.48mm/px · z∈[+842,+1068]mm · 6 of 328 slices shown]
[im 47/328  soft-tissue]
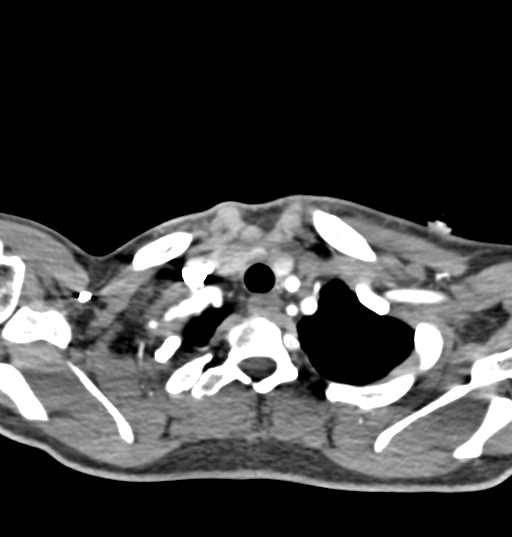
[im 94/328  bone]
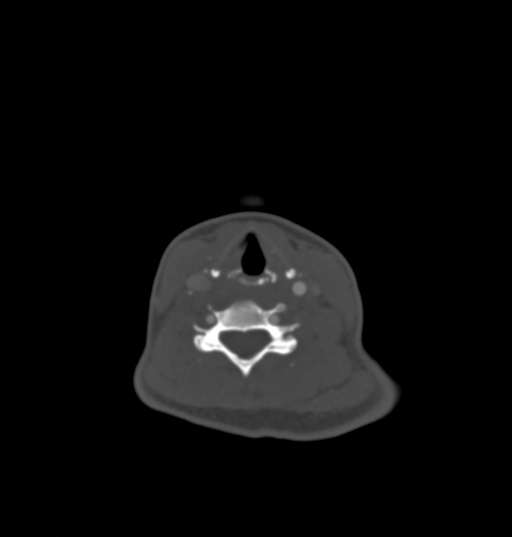
[im 141/328  soft-tissue]
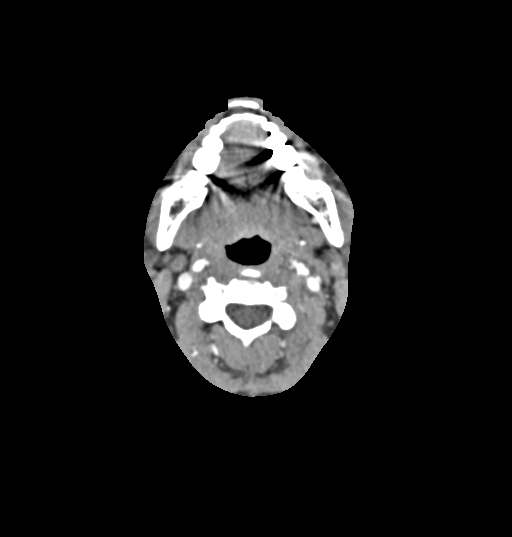
[im 187/328  bone]
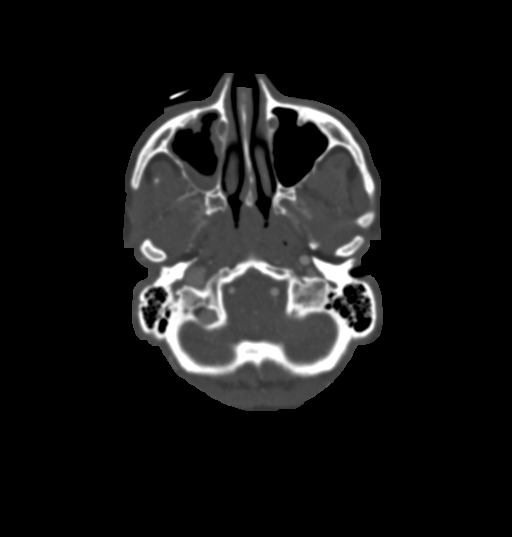
[im 234/328  soft-tissue]
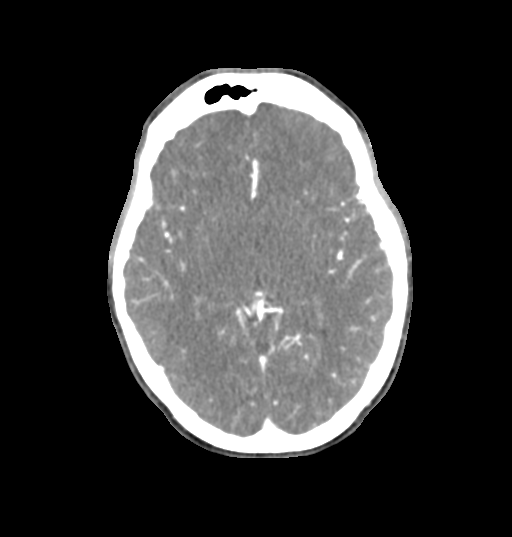
[im 281/328  bone]
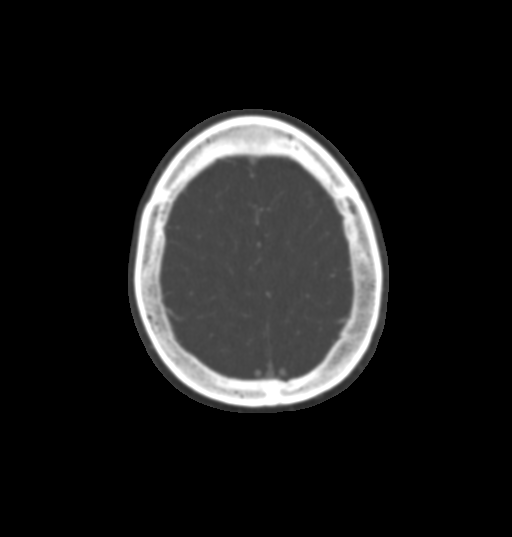

[8 of 33 positions shown; findings below may reference images not displayed]

RADIATION DOSE REDUCTION: This exam was performed according to the
departmental dose-optimization program which includes automated
exposure control, adjustment of the mA and/or kV according to
patient size and/or use of iterative reconstruction technique.

CONTRAST:  100mL OMNIPAQUE IOHEXOL 350 MG/ML SOLN
FINDINGS: CTA NECK FINDINGS

Aortic arch: Visualized aortic arch normal in caliber with normal
branch pattern. No stenosis or other abnormality about the origin of
the great vessels.

Right carotid system: Congenital absence of the right common and
internal carotid arteries. Right external carotid artery appears to
arise from branches of thyroidal arteries and appear patent.

Left carotid system: Left common and internal carotid arteries
patent without stenosis or dissection.

Vertebral arteries: Both vertebral arteries arise from the
subclavian arteries. No proximal subclavian artery stenosis. Both
vertebral arteries widely patent without stenosis, dissection or
occlusion.

Skeleton: No discrete or worrisome osseous lesions. Median
sternotomy wires noted.

Other neck: No other acute soft tissue abnormality within the neck.
Chronic right maxillary sinusitis noted.

Upper chest: Visualized upper chest demonstrates no acute finding.

Review of the MIP images confirms the above findings

CTA HEAD FINDINGS

Anterior circulation: Left ICA widely patent to the terminus without
stenosis or other abnormality. There is distal reconstitution of the
right ICA at the distal cavernous/paraclinoid region via collateral
flow, either from the ophthalmic artery, right posterior
communicating artery, or cross the circle-of-Willis. A1 segments
patent bilaterally. Right A1 hypoplastic. Normal anterior
communicating artery complex. Anterior cerebral arteries patent
without stenosis. Both M1 segments widely patent without stenosis.
Normal MCA bifurcations. Distal MCA branches well perfused and
symmetric.

Posterior circulation: Both vertebral arteries widely patent without
stenosis. Both PICA patent at their origins. Basilar patent to its
distal aspect without stenosis. Superior cerebellar arteries patent
bilaterally. Left PCA primarily supplied via the basilar. Right PCA
supplied via the basilar as well as a robust right posterior artery.
PCAs well perfused or distal aspects without stenosis.

Venous sinuses: Patent allowing for timing the contrast bolus.

Anatomic variants: Congenital absence of the right ICA. No aneurysm.

Review of the MIP images confirms the above findings
IMPRESSION: 1. Negative CTA for large vessel occlusion or other emergent
finding.
2. Congenital absence of the right common and internal carotid
arteries. Distal reconstitution of the right carotid siphon as
above.
3. Otherwise unremarkable and normal CTA of the head and neck. No
significant atheromatous disease or stenosis. No aneurysm.
4. Chronic right maxillary sinusitis.

## 2023-02-24 ENCOUNTER — Encounter: Payer: Self-pay | Admitting: Psychiatry

## 2023-02-24 ENCOUNTER — Other Ambulatory Visit: Payer: Self-pay | Admitting: Psychiatry

## 2023-02-24 ENCOUNTER — Ambulatory Visit (INDEPENDENT_AMBULATORY_CARE_PROVIDER_SITE_OTHER): Payer: 59 | Admitting: Psychiatry

## 2023-02-24 VITALS — BP 137/85 | HR 76 | Ht 61.0 in | Wt 114.0 lb

## 2023-02-24 DIAGNOSIS — G43119 Migraine with aura, intractable, without status migrainosus: Secondary | ICD-10-CM | POA: Diagnosis not present

## 2023-02-24 MED ORDER — UBRELVY 100 MG PO TABS: 100.0000 mg | ORAL_TABLET | ORAL | 12 refills | Status: DC | PRN

## 2023-02-24 NOTE — Progress Notes (Signed)
   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 MRI brain 02/16/22 showed few remote microhemorrhages, suspected to be from prior heart surgery. CTA 02/16/22 redemonstrated absent right CCA and ICA with distal reconstitution.  At her last visit she was provided with Nurtec and Ubrelvy samples. She was recommended to start magnesium for migraine prevention.  Interval History: Headaches have been well-controlled since her last visit. She has only had one migraine since last March, which occurred two months ago. She had her typical visual aura and numbness. Did not have any language issues with this migraine. She took [place] at the onset of her aura, which did resolve the migraine after 1-2 hours. It did not cause side effects.  Migraine days per month: 1 Headache free days per month: 29  Current Headache Regimen: Preventative: none Abortive: Ubrelvy 100 mg PRN   Prior Therapies                                  Imitrex - side effects Naratriptan - lack of efficacy Ubrelvy 100 mg PRN  Physical Exam:   Vital Signs: BP 137/85   Pulse 76   Ht 5\' 1"  (1.549 m)   Wt 114 lb (51.7 kg)   BMI 21.54 kg/m  GENERAL:  well appearing, in no acute distress, alert  SKIN:  Color, texture, turgor normal. No rashes or lesions HEAD:  Normocephalic/atraumatic. RESP: normal respiratory effort MSK:  No gross joint deformities.   NEUROLOGICAL: Mental Status: Alert, oriented to person, place and time, Follows commands, and Speech fluent and appropriate. Cranial Nerves: PERRL, face symmetric, no dysarthria, hearing grossly intact Motor: moves all extremities equally Gait: normal-based.  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 Migraines have been well-controlled, with only one migraine in the past year. Cathy Vasquez has worked well for rescue. Will send a prescription to her pharmacy.  PLAN: -Rescue: Continue Ubrelvy 100 mg PRN   Follow-up: 1 year  I spent a total of 20 minutes on the date of the service. Headache education was done. Discussed medication side effects, adverse reactions and drug interactions. Written educational materials and patient instructions outlining all of the above were given.  Genia Harold, MD 02/24/23 9:24 AM

## 2023-02-25 ENCOUNTER — Encounter: Payer: 59 | Admitting: Physical Therapy

## 2023-03-04 ENCOUNTER — Encounter: Payer: Self-pay | Admitting: Cardiology

## 2023-03-04 ENCOUNTER — Telehealth: Payer: Self-pay | Admitting: Cardiovascular Disease

## 2023-03-04 ENCOUNTER — Ambulatory Visit: Payer: 59 | Attending: Cardiology | Admitting: Cardiology

## 2023-03-04 ENCOUNTER — Encounter: Payer: 59 | Admitting: Physical Therapy

## 2023-03-04 VITALS — BP 124/86 | HR 70 | Ht 61.0 in | Wt 114.2 lb

## 2023-03-04 DIAGNOSIS — R002 Palpitations: Secondary | ICD-10-CM | POA: Diagnosis not present

## 2023-03-04 NOTE — Patient Instructions (Signed)
Medication Instructions:  Your physician recommends that you continue on your current medications as directed. Please refer to the Current Medication list given to you today. *If you need a refill on your cardiac medications before your next appointment, please call your pharmacy*   Testing/Procedures: We recommend you to purchase [place]: At [facility], you and your health needs are our priority.  As part of our continuing mission to provide you with exceptional heart care, we have created designated Provider Care Teams.  These Care Teams include your primary Cardiologist (physician) and Advanced Practice Providers (APPs -  Physician Assistants and Nurse Practitioners) who all work together to provide you with the care you need, when you need it.   Your next appointment:   As needed  Provider:   Allegra Lai, MD

## 2023-03-04 NOTE — Progress Notes (Signed)
Electrophysiology Office Note   Date:  03/04/2023   ID:  Cathy Vasquez, DOB 06-Dec-1991, MRN [id number]  PCP:  Leamon Arnt, MD  Cardiologist:  Johnsie Cancel Primary Electrophysiologist:  Yanette Tripoli Meredith Leeds, MD    Chief Complaint: palpitations   History of Present Illness: Cathy Vasquez is a 32 y.o. female who is being seen today for the evaluation of palpitations at the request of Leamon Arnt, MD. Presenting today for electrophysiology evaluation.  History significant for congenital heart disease.  She had aortic stenosis and underwent Ross procedure in 2004.  She had 2 successful pregnancies in 2014 and 2017.  Follow-up echo shows moderate MR with mitral valve prolapse.    He has been having palpitations.  Palpitations have been worse over the last few weeks.  They occur at variable times and feel like a fluttering in her chest.  There is no exacerbating or alleviating factor.  Currently she feels well.  She is in sinus rhythm with no major abnormality on her ECG.  Today, she denies symptoms of chest pain, shortness of breath, orthopnea, PND, lower extremity edema, claudication, dizziness, presyncope, syncope, bleeding, or neurologic sequela. The patient is tolerating medications without difficulties.    Past Medical History:  Diagnosis Date   Aortic aneurysm ([place])    Aortic stenosis    Atopic eczema 10/15/2018   Gastroesophageal reflux disease without esophagitis 12/29/2015   - discussed lifestyle changes - prescribed ranitidine 12/29/15 Rx Zantac 150 mg # 60 BID with refills x 5 given(01/19/16)   Heart disease    Heart murmur    Migraines    Past Surgical History:  Procedure Laterality Date   CARDIAC SURGERY  05/17/2003   Post Ross Procedure   ROSS [facility] PROCEDURE     VENOUS ABLATION Right 2020   varicose veins/venous insufficiency   WISDOM TOOTH EXTRACTION       Current Outpatient Medications  Medication Sig Dispense Refill   cetirizine (ZYRTEC) 10 MG tablet Take 10 mg  by mouth daily.     clindamycin (CLEOCIN) 300 MG capsule Take 2 capsules 30-60 minutes before your appointment as needed 10 capsule 1   tiZANidine (ZANAFLEX) 4 MG tablet Take 0.5-1 tablets (2-4 mg total) by mouth every 8 (eight) hours as needed for muscle spasms. 30 tablet 1   triamcinolone ointment (KENALOG) 0.1 % 1 APP ONCE A DAY AT BEDTIME as needed APPLIED TOPICALLY 80 g 0   Ubrogepant (UBRELVY) 100 MG TABS Take 1 tablet (100 mg total) by mouth as needed (for migraine). May repeat a dose in 2 hours if needed. Max dose 2 pills in 24 hours 16 tablet 12   No current facility-administered medications for this visit.    Allergies:   Other, Amoxicillin, Cefaclor, and Penicillin g   Social History:  The patient  reports that she has never smoked. She has never used smokeless tobacco. She reports that she does not currently use alcohol. She reports that she does not currently use drugs.   Family History:  The patient's family history includes Diabetes in her paternal grandfather; Healthy in her daughter, sister, sister, and son; Heart disease in her maternal grandmother; Hypertension in her father and mother; Migraines in her maternal aunt.    ROS:  Please see the history of present illness.   Otherwise, review of systems is positive for none.   All other systems are reviewed and negative.    PHYSICAL EXAM: VS:  BP 124/86   Pulse 70   Ht  5\' 1"  (1.549 m)   Wt 114 lb 3.2 oz (51.8 kg)   SpO2 99%   BMI 21.58 kg/m  , BMI Body mass index is 21.58 kg/m. GEN: Well nourished, well developed, in no acute distress  HEENT: normal  Neck: no JVD, carotid bruits, or masses Cardiac: RRR; no murmurs, rubs, or gallops,no edema  Respiratory:  clear to auscultation bilaterally, normal work of breathing GI: soft, nontender, nondistended, + BS MS: no deformity or atrophy  Skin: warm and dry Neuro:  Strength and sensation are intact Psych: euthymic mood, full affect  EKG:  EKG is ordered today. Personal  review of the ekg ordered shows sinus rhythm, lateral T wave inversions  Recent Labs: 12/13/2022: ALT 10; BUN 12; Creatinine, Ser 0.72; Hemoglobin 14.3; Platelets 232.0; Potassium 4.5; Sodium 140    Lipid Panel     Component Value Date/Time   CHOL 195 12/13/2022 0933   TRIG 74.0 12/13/2022 0933   HDL 50.80 12/13/2022 0933   CHOLHDL 4 12/13/2022 0933   VLDL 14.8 12/13/2022 0933   LDLCALC 129 (H) 12/13/2022 0933   LDLCALC 113 (H) 11/14/2020 1133     Wt Readings from Last 3 Encounters:  03/04/23 114 lb 3.2 oz (51.8 kg)  02/24/23 114 lb (51.7 kg)  12/23/22 118 lb (53.5 kg)      Other studies Reviewed: Additional studies/ records that were reviewed today include: TTE 06/26/22  Review of the above records today demonstrates:   1. Patient with history of Ross procedure. LV poorly visualized. LVEF is  roughly 50-55%, consider limited contrast study for more precise  evaluation. . Left ventricular ejection fraction, by estimation, is 50 to  55%. The left ventricle has low normal  function. Left ventricular endocardial border not optimally defined to  evaluate regional wall motion. Left ventricular diastolic parameters are  indeterminate.   2. RV not well visualized, grossly appears normal in size and function. .  Right ventricular systolic function was not well visualized. The right  ventricular size is not well visualized. Tricuspid regurgitation signal is  inadequate for assessing PA  pressure.   3. The mitral valve was not well visualized. Mild mitral valve  regurgitation. No evidence of mitral stenosis.   4. The aortic valve has an indeterminant number of cusps. Aortic valve  regurgitation is mild. No aortic stenosis is present.   5. Aortic dilatation noted. There is moderate dilatation of the aortic  root, measuring 45 mm.   6. The inferior vena cava is normal in size with greater than 50%  respiratory variability, suggesting right atrial pressure of 3 mmHg.    ASSESSMENT  AND PLAN:  1.  Congenital heart disease: Status post Ross procedure.  Pulmonary autograft in the aortic position.  Mild mitral regurgitation.  Plan per primary cardiology.  2.  Palpitations: She has been having palpitations every few days over the last week and a half.  EKG today is without abnormality.  I offered 2-week monitor versus Kardia mobile.  She Hiyab Nhem obtain a cardia mobile and Phelicia Dantes upload information into my chart and send it to her primary cardiologist.  discussed with primary cardiology  Current medicines are reviewed at length with the patient today.   The patient does not have concerns regarding her medicines.  The following changes were made today:  none  Labs/ tests ordered today include:  Orders Placed This Encounter  Procedures   EKG 12-Lead     Disposition:   FU with Ramaya Guile  PRN  Signed, Ajanee Buren Meredith Leeds, MD  03/04/2023 2:29 PM     [place] [address] [place] [address] [phone] (office) [phone] (fax)

## 2023-03-04 NOTE — Telephone Encounter (Signed)
Called patient back about message. Patient stated she has been having palpitations and "not feeling right" since Saturday night. Patient stated she felt her pulse and it is all over the place. Patient stated she feels like she has to catch her breath, but not SOB. Patient is very concerned. Made patient an appointment with DOD, Dr. Curt Bears, today. Will forward to Dr. Johnsie Cancel, so he is aware.

## 2023-03-04 NOTE — Telephone Encounter (Signed)
Patient c/o Palpitations:  High priority if patient c/o lightheadedness, shortness of breath, or chest pain  How long have you had palpitations/irregular HR/ Afib? Are you having the symptoms now? Since Sunday 03/24 morning and feeling currently    Are you currently experiencing lightheadedness, SOB or CP? She states it is making her feel bad.   Do you have a history of afib (atrial fibrillation) or irregular heart rhythm? Yes   Have you checked your BP or HR? (document readings if available): 80   Are you experiencing any other symptoms? No, just a really bad feeling over all.

## 2023-03-06 ENCOUNTER — Encounter: Payer: Self-pay | Admitting: Cardiology

## 2023-03-06 ENCOUNTER — Telehealth: Payer: Self-pay | Admitting: Cardiovascular Disease

## 2023-03-06 NOTE — Telephone Encounter (Signed)
Followed up with pt. PVCs going on since Sunday that she is aware of.  She can feel something, and not feeling well at times. Cathy Vasquez mobile (pt tried their interpretive services for a month) stating she is having PVCs. Pt advised to attach strips to mychart message for MD to review. Aware if so, MD will most likely order a monitor for burden. Patient verbalized understanding and agreeable to plan.    Will make MD aware pt has hx of mitral valve prolapse.

## 2023-03-06 NOTE — Telephone Encounter (Signed)
Patient wants to forward reading from her [facility] to confirm readings and next steps.

## 2023-03-10 ENCOUNTER — Encounter: Payer: Self-pay | Admitting: Cardiology

## 2023-03-10 DIAGNOSIS — R002 Palpitations: Secondary | ICD-10-CM

## 2023-03-12 ENCOUNTER — Ambulatory Visit: Payer: 59 | Attending: Cardiology

## 2023-03-12 DIAGNOSIS — R002 Palpitations: Secondary | ICD-10-CM

## 2023-03-12 NOTE — Telephone Encounter (Signed)
Patient is following up, requesting a call back from Dr. Macky Lower nurse.

## 2023-03-12 NOTE — Progress Notes (Unsigned)
Enrolled for [employer] to mail a ZIO XT long term holter monitor to the patients address on file.  

## 2023-03-14 ENCOUNTER — Other Ambulatory Visit: Payer: Self-pay | Admitting: Psychiatry

## 2023-03-14 ENCOUNTER — Encounter: Payer: Self-pay | Admitting: Psychiatry

## 2023-03-14 DIAGNOSIS — R002 Palpitations: Secondary | ICD-10-CM | POA: Diagnosis not present

## 2023-03-17 ENCOUNTER — Telehealth: Payer: Self-pay | Admitting: *Deleted

## 2023-03-17 NOTE — Telephone Encounter (Signed)
Rx routed to PA team

## 2023-03-17 NOTE — Telephone Encounter (Signed)
   Please route replies to POD 1

## 2023-03-18 ENCOUNTER — Other Ambulatory Visit (HOSPITAL_COMMUNITY): Payer: Self-pay

## 2023-03-18 ENCOUNTER — Other Ambulatory Visit: Payer: Self-pay | Admitting: Psychiatry

## 2023-03-18 MED ORDER — NURTEC 75 MG PO TBDP: 75.0000 mg | ORAL_TABLET | ORAL | 6 refills | Status: DC | PRN

## 2023-03-18 NOTE — Telephone Encounter (Signed)
Dr. Delena Bali per Prior Auth team "[facility] Product Not on Formulary.  Nurtec is Preferred."   Please advise

## 2023-03-18 NOTE — Telephone Encounter (Signed)
I sent an rx for Nurtec to her pharmacy, we can try that instead

## 2023-03-18 NOTE — Telephone Encounter (Signed)
Pt aware of the switch to nurtec and voiced gratitude and understanding

## 2023-04-02 ENCOUNTER — Encounter: Payer: Self-pay | Admitting: Physical Therapy

## 2023-04-02 ENCOUNTER — Other Ambulatory Visit (HOSPITAL_COMMUNITY): Payer: Self-pay

## 2023-04-02 ENCOUNTER — Telehealth: Payer: Self-pay | Admitting: Family Medicine

## 2023-04-02 ENCOUNTER — Ambulatory Visit: Payer: 59 | Admitting: Physical Therapy

## 2023-04-02 DIAGNOSIS — M542 Cervicalgia: Secondary | ICD-10-CM | POA: Diagnosis not present

## 2023-04-02 DIAGNOSIS — M6281 Muscle weakness (generalized): Secondary | ICD-10-CM

## 2023-04-02 NOTE — Therapy (Signed)
OUTPATIENT PHYSICAL THERAPY TREATMENT NOTE and RECERT Progress Note Reporting Period 01/09/23 to 04/02/23  See note below for Objective Data and Assessment of Progress/Goals.       Patient Name: Cathy Vasquez MRN: [id number] DOB:[birth year], 32 y.o., female Today's Date: 04/02/2023  PCP: Willow Ora, MD   REFERRING PROVIDER: Rodolph Bong, MD  END OF SESSION:   PT End of Session - 04/02/23 1013     Visit Number 9    Number of Visits 15    Date for PT Re-Evaluation 05/28/23    Authorization Type [employer]    Authorization - Visit Number 9    Authorization - Number of Visits 30    Progress Note Due on Visit 19    PT Start Time 1015    PT Stop Time 1057    PT Time Calculation (min) 42 min             Past Medical History:  Diagnosis Date   Aortic aneurysm    Aortic stenosis    Atopic eczema 10/15/2018   Gastroesophageal reflux disease without esophagitis 12/29/2015   - discussed lifestyle changes - prescribed ranitidine 12/29/15 Rx Zantac 150 mg # 60 BID with refills x 5 given(01/19/16)   Heart disease    Heart murmur    Migraines    Past Surgical History:  Procedure Laterality Date   CARDIAC SURGERY  05/17/2003   Post Ross Procedure   ROSS [facility] PROCEDURE     VENOUS ABLATION Right 2020   varicose veins/venous insufficiency   WISDOM TOOTH EXTRACTION     Patient Active Problem List   Diagnosis Date Noted   SBE (subacute bacterial endocarditis) prophylaxis candidate 12/13/2022   Notalgia paresthetica 12/13/2022   DUB (dysfunctional uterine bleeding) 12/13/2022   Carotid occlusion, right 08/07/2022   Positive D dimer 06/12/2022   Migraine, ophthalmoplegic 11/09/2019   Migraine with aura    Atopic eczema 10/15/2018   Dyshidrotic eczema 10/15/2018   Venous insufficiency (chronic) (peripheral) 07/07/2018   Congenital aortic stenosis 11/07/2017   Non-rheumatic mitral regurgitation 03/31/2017   MVP (mitral valve prolapse) 03/31/2017   Ascending  aorta dilatation 03/28/2017   H/O Ross procedure 08/25/2016   Varicose veins of right leg with edema 07/25/2016    THERAPY DIAG:  Cervicalgia - Plan: PT plan of care cert/re-cert  Muscle weakness (generalized) - Plan: PT plan of care cert/re-cert  REFERRING DIAG: M62.838 (ICD-10-CM) - Neck muscle spasm M54.6 (ICD-10-CM) - Pain in thoracic spine   Rationale for Evaluation and Treatment: Rehabilitation   ONSET DATE: 2 years ago   SUBJECTIVE:  SUBJECTIVE STATEMENT: 04/02/2023 States she was having heart palpitations and states she has gotten a cardio mobile device to do an EKG. States her cardiac stuff is better. States that she has been doing her exercises but not as consistent as she should. States she is still really aware of her position during the day. States she doesn't feel as tight and she likes the prone exercise.  States overall she feels about 70% better since the start of PT.  Eval: States that initially she hurt her back after she went to a theme park and rode some spinning rides with her kids and carrying a backpack on her back. This eventually resolved so she didn't seek PT at the time. Pain returned last year 10/10/22 and continued for all the month of December. Pain is better now but she doesn't want pain to return. States she still has uneasiness with certain movements/activities.     States that it usually is painful on the right but sometimes it goes to the left. States she can't lay on the side that hurts. When she uses her arm it more she feels like it will set it off.  States she doesn't really exercise but wants to do better. Pain with breathing at times   PERTINENT HISTORY:  open heart surgeries x29 one at 45 weeks old and 32 years old-has, heart aneurysm. 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 no: NPRS scale: 0/10 Pain location: mid thoracic and radiates to neck and shoulders Both sides  Pain description: present aware of pain Aggravating factors: bending over into washer and reaching over head, pushign self off floor Relieving factors: heat, rest   PRECAUTIONS: None   WEIGHT BEARING RESTRICTIONS: No   FALLS:  Has patient fallen in last 6 months? No     OCCUPATION: stay at home mom, home schools    PLOF: Independent   PATIENT GOALS: reduce incidence of pain    OBJECTIVE:    DIAGNOSTIC FINDINGS:  Xray 12/23/22 T spine FINDINGS: Curvature of the thoracolumbar spine, apex to the left. No other malalignment. No fractures. No significant degenerative changes. No other bony or soft tissue abnormalities.   IMPRESSION: Curvature of the thoracolumbar spine, apex to the right. No other abnormalities.     CERVICAL ROM:    Active ROM A/PROM (deg) 04/02/23  Flexion WFL*  Extension WFL  Right lateral flexion WFL  Left lateral flexion WFL*  Right rotation WFL  Left rotation WFL*   (Blank rows = not tested) *tight/tender on left side               UE Measurements       Upper Extremity Right 04/02/23 Left 04/02/23    A/PROM MMT A/PROM MMT  Shoulder Flexion 185 4 185 4  Shoulder Extension          Shoulder Abduction WFL 4 WFL 4  Shoulder Adduction          Shoulder Internal Rotation Reaches to T12 SP 4- Reaches to T6 SP (winging) 4-*  Shoulder External Rotation Reaches to T6 SP 4 Reaches T6 SP  4  Elbow Flexion          Elbow Extension          Wrist Flexion          Wrist Extension          Wrist Supination          Wrist  Pronation          Wrist Ulnar Deviation          Wrist Radial Deviation          Grip Strength NA   NA                          (Blank rows = not tested)                       * pain                       Right handed       TODAY'S TREATMENT:                                                                                                                               DATE: 04/02/2023  Therapeutic Exercise:    Aerobic: Standing: counter pushups x10  Neuromuscular Re-education  Manual Therapy:  Self Care: Trigger Point Dry Needling:  Modalities:    PATIENT EDUCATION:  Education details: on HEP, on progress made, POC moving forward, with f/u with MD about lump in breast, fatigue, and OB f/u Person educated: Patient Education method: Explanation, Demonstration, and Handouts Education comprehension: verbalized understanding     HOME EXERCISE PROGRAM: [id number]   ASSESSMENT:   CLINICAL IMPRESSION: 04/02/2023 Progress note performed on this date.  Overall patient is doing very well and has met 3 of 4 short-term goals and 2 of 3 long-term goals.  Session focused on education and answering all questions and concerns about current fatigue levels, lump along breast/armpit and strengthening focus moving forward.  Extending POC to continue to work on improving overall strength and function at home and in the community.  Eval: Patient presents with episodic mid thoracic pain that radiates up into neck and shoulder. Current episode is resolving but would like to address causes of her pain to reduce episodes and severity of pain. Patient demonstrates good cervical and UE motion but limitations in thoracic motion and generalized weakness throughout likely contributing to current condition. Patent's history of open heart surgery also likely contributing to current presentation. Patient would greatly benefit from skilled PT to improve overall function and QOL.    OBJECTIVE IMPAIRMENTS: decreased activity tolerance, decreased ROM, decreased strength, improper body mechanics, postural dysfunction, and pain.    ACTIVITY LIMITATIONS: lifting, bending, and reach over head   PARTICIPATION LIMITATIONS: meal prep, cleaning, laundry, and shopping   PERSONAL FACTORS: Age, Fitness,  Time since onset of injury/illness/exacerbation, and 1 comorbidity: hx of x2 open heart surgery (last when she was 32 years old)  are also affecting patient's functional outcome.    REHAB POTENTIAL: Good   CLINICAL DECISION MAKING: Stable/uncomplicated   EVALUATION COMPLEXITY: Low     GOALS: Goals reviewed with patient? yes   SHORT TERM GOALS: Target date: 02/06/2023  Patient  will be independent in self management strategies to improve quality of life and functional outcomes. Baseline: New Program Goal status: PROGRESSING    2.  Patient will report at least 50% improvement in overall symptoms and/or function to demonstrate improved functional mobility Baseline: 0% better Goal status: MET   3.  Patient will be able to lift plates into cabinet without pain or discomfort to improve ability to perform daily activities  Baseline: painful Goal status: MET    4.  Patient will be able to demonstrate good diaphragmatic breath to reduce anterior/upper chest breathing  Baseline: chest breather Goal status: MET       LONG TERM GOALS: Target date: 03/06/2023    Patient will report at least 75% improvement in overall symptoms and/or function to demonstrate improved functional mobility Baseline: 0% better Goal status: PROGRESSING   2.  Patient will be able to perform all daily tasks without pain or difficulty to improve overall function and QOL. Baseline: see above Goal status: MET   3.  Patient will be able to demonstrate good active sitting posture to reduce incidence of pain Baseline: unable Goal status: MET         PLAN:   PT FREQUENCY: 1-2x/week for total of 6 visits over 8 week certification    PT DURATION: 8 weeks   PLANNED INTERVENTIONS: Therapeutic exercises, Therapeutic activity, Neuromuscular re-education, Balance training, Gait training, Patient/Family education, Self Care, Joint mobilization, Joint manipulation, Vestibular training, Canalith repositioning,  Orthotic/Fit training, DME instructions, Aquatic Therapy, Dry Needling, Electrical stimulation, Spinal manipulation, Spinal mobilization, Cryotherapy, Moist heat, Traction, Ultrasound, Ionotophoresis 4mg /ml Dexamethasone, Manual therapy, and Re-evaluation   PLAN FOR NEXT SESSION: strengthening      11:03 AM, 04/02/23 Tereasa Coop, DPT Physical Therapy with Dolores Lory

## 2023-04-02 NOTE — Telephone Encounter (Signed)
Patient is requesting to transfer care from Dr. Mardelle Matte to [facility]. Is this okay with you?

## 2023-04-02 NOTE — Telephone Encounter (Signed)
Pharmacy Patient Advocate Encounter   Received notification from [facility] that prior authorization for Nurtec is required/requested.  PA submitted on 04/02/2023 to (ins) [employer] via [employer] or [facility]) confirmation # [id number] Status is pending

## 2023-04-02 NOTE — Telephone Encounter (Signed)
Pt has not received any Nurtec, she is asking if RN can look further into approval

## 2023-04-07 ENCOUNTER — Ambulatory Visit: Payer: 59 | Admitting: Physical Therapy

## 2023-04-07 ENCOUNTER — Encounter: Payer: Self-pay | Admitting: Physical Therapy

## 2023-04-07 DIAGNOSIS — M542 Cervicalgia: Secondary | ICD-10-CM | POA: Diagnosis not present

## 2023-04-07 DIAGNOSIS — M6281 Muscle weakness (generalized): Secondary | ICD-10-CM

## 2023-04-07 NOTE — Therapy (Signed)
OUTPATIENT PHYSICAL THERAPY TREATMENT NOTE      Patient Name: Cathy Vasquez MRN: [id number] DOB:04/15/1991, 32 y.o., female Today's Date: 04/07/2023  PCP: Willow Ora, MD   REFERRING PROVIDER: Rodolph Bong, MD  END OF SESSION:   PT End of Session - 04/07/23 0928     Visit Number 10    Number of Visits 15    Date for PT Re-Evaluation 05/28/23    Authorization Type [employer]    Authorization - Visit Number 10    Authorization - Number of Visits 30    Progress Note Due on Visit 19    PT Start Time 0930    PT Stop Time 1010    PT Time Calculation (min) 40 min    Activity Tolerance Patient tolerated treatment well    Behavior During Therapy WFL for tasks assessed/performed             Past Medical History:  Diagnosis Date   Aortic aneurysm (HCC)    Aortic stenosis    Atopic eczema 10/15/2018   Gastroesophageal reflux disease without esophagitis 12/29/2015   - discussed lifestyle changes - prescribed ranitidine 12/29/15 Rx Zantac 150 mg # 60 BID with refills x 5 given(01/19/16)   Heart disease    Heart murmur    Migraines    Past Surgical History:  Procedure Laterality Date   CARDIAC SURGERY  05/17/2003   Post Ross Procedure   ROSS [facility] PROCEDURE     VENOUS ABLATION Right 2020   varicose veins/venous insufficiency   WISDOM TOOTH EXTRACTION     Patient Active Problem List   Diagnosis Date Noted   SBE (subacute bacterial endocarditis) prophylaxis candidate 12/13/2022   Notalgia paresthetica 12/13/2022   DUB (dysfunctional uterine bleeding) 12/13/2022   Carotid occlusion, right 08/07/2022   Positive D dimer 06/12/2022   Migraine, ophthalmoplegic 11/09/2019   Migraine with aura    Atopic eczema 10/15/2018   Dyshidrotic eczema 10/15/2018   Venous insufficiency (chronic) (peripheral) 07/07/2018   Congenital aortic stenosis 11/07/2017   Non-rheumatic mitral regurgitation 03/31/2017   MVP (mitral valve prolapse) 03/31/2017   Ascending aorta  dilatation (HCC) 03/28/2017   H/O Ross procedure 08/25/2016   Varicose veins of right leg with edema 07/25/2016    THERAPY DIAG:  Cervicalgia  Muscle weakness (generalized)  REFERRING DIAG: W10.272 (ICD-10-CM) - Neck muscle spasm M54.6 (ICD-10-CM) - Pain in thoracic spine   Rationale for Evaluation and Treatment: Rehabilitation   ONSET DATE: 2 years ago   SUBJECTIVE:  SUBJECTIVE STATEMENT: 04/07/2023 States that she is a little sore but likes the new exercises. Reports a mild soreness.   Eval: States that initially she hurt her back after she went to a theme park and rode some spinning rides with her kids and carrying a backpack on her back. This eventually resolved so she didn't seek PT at the time. Pain returned last year 10/10/22 and continued for all the month of December. Pain is better now but she doesn't want pain to return. States she still has uneasiness with certain movements/activities.     States that it usually is painful on the right but sometimes it goes to the left. States she can't lay on the side that hurts. When she uses her arm it more she feels like it will set it off.  States she doesn't really exercise but wants to do better. Pain with breathing at times   PERTINENT HISTORY:  open heart surgeries x46 one at 15 weeks old and 32 years old-has, heart aneurysm. 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 yes: NPRS scale: 1/10 Pain location: mid thoracic and radiates to neck and shoulders Both sides  Pain description: present aware of soreness Aggravating factors: bending over into washer and reaching over head, pushign self off floor Relieving factors: heat, rest   PRECAUTIONS: None   WEIGHT BEARING RESTRICTIONS: No   FALLS:  Has  patient fallen in last 6 months? No     OCCUPATION: stay at home mom, home schools    PLOF: Independent   PATIENT GOALS: reduce incidence of pain    OBJECTIVE:    DIAGNOSTIC FINDINGS:  Xray 12/23/22 T spine FINDINGS: Curvature of the thoracolumbar spine, apex to the left. No other malalignment. No fractures. No significant degenerative changes. No other bony or soft tissue abnormalities.   IMPRESSION: Curvature of the thoracolumbar spine, apex to the right. No other abnormalities.     CERVICAL ROM:    Active ROM A/PROM (deg) 04/02/23  Flexion WFL*  Extension WFL  Right lateral flexion WFL  Left lateral flexion WFL*  Right rotation WFL  Left rotation WFL*   (Blank rows = not tested) *tight/tender on left side               UE Measurements       Upper Extremity Right 04/02/23 Left 04/02/23    A/PROM MMT A/PROM MMT  Shoulder Flexion 185 4 185 4  Shoulder Extension          Shoulder Abduction WFL 4 WFL 4  Shoulder Adduction          Shoulder Internal Rotation Reaches to T12 SP 4- Reaches to T6 SP (winging) 4-*  Shoulder External Rotation Reaches to T6 SP 4 Reaches T6 SP  4  Elbow Flexion          Elbow Extension          Wrist Flexion          Wrist Extension          Wrist Supination          Wrist Pronation          Wrist Ulnar Deviation          Wrist Radial Deviation          Grip Strength NA   NA                          (  Blank rows = not tested)                       * pain                       Right handed       TODAY'S TREATMENT:                                                                                                                              DATE: 04/07/2023  Therapeutic Exercise:    Aerobic: Quad: too difficult - over ball:  scap protraction 5" holds x3 reps of 1 minute bouts,  shoulder taps 3x10 over ball B Seated: shoulder protraction with ball in seated positions 3 minutes, horizontal shoulder abd unilateal red band at wall  2x15B Standing: counter pushups - reviewed Neuromuscular Re-education  Manual Therapy:  Self Care: Trigger Point Dry Needling:  Modalities:    PATIENT EDUCATION:  Education details: on HEP  Person educated: Patient Education method: Explanation, Facilities manager, and Handouts Education comprehension: verbalized understanding     HOME EXERCISE PROGRAM: [id number]   ASSESSMENT:   CLINICAL IMPRESSION: 04/07/2023 Focused on strengthening today. Quadruped challenging for patient so added ball for support. This was tolerated better. Added theraband exercises and this was also tolerated very well. Provided patient with band for HEP adherence. Overall patient is doing well and would continue to benefit from skilled PT at this time.   Eval: Patient presents with episodic mid thoracic pain that radiates up into neck and shoulder. Current episode is resolving but would like to address causes of her pain to reduce episodes and severity of pain. Patient demonstrates good cervical and UE motion but limitations in thoracic motion and generalized weakness throughout likely contributing to current condition. Patent's history of open heart surgery also likely contributing to current presentation. Patient would greatly benefit from skilled PT to improve overall function and QOL.    OBJECTIVE IMPAIRMENTS: decreased activity tolerance, decreased ROM, decreased strength, improper body mechanics, postural dysfunction, and pain.    ACTIVITY LIMITATIONS: lifting, bending, and reach over head   PARTICIPATION LIMITATIONS: meal prep, cleaning, laundry, and shopping   PERSONAL FACTORS: Age, Fitness, Time since onset of injury/illness/exacerbation, and 1 comorbidity: hx of x2 open heart surgery (last when she was 32 years old)  are also affecting patient's functional outcome.    REHAB POTENTIAL: Good   CLINICAL DECISION MAKING: Stable/uncomplicated   EVALUATION COMPLEXITY: Low     GOALS: Goals reviewed with  patient? yes   SHORT TERM GOALS: Target date: 02/06/2023  Patient will be independent in self management strategies to improve quality of life and functional outcomes. Baseline: New Program Goal status: PROGRESSING    2.  Patient will report at least 50% improvement in overall symptoms and/or function to demonstrate improved functional mobility Baseline: 0% better Goal status: MET   3.  Patient will be able  to lift plates into cabinet without pain or discomfort to improve ability to perform daily activities  Baseline: painful Goal status: MET    4.  Patient will be able to demonstrate good diaphragmatic breath to reduce anterior/upper chest breathing  Baseline: chest breather Goal status: MET       LONG TERM GOALS: Target date: 03/06/2023    Patient will report at least 75% improvement in overall symptoms and/or function to demonstrate improved functional mobility Baseline: 0% better Goal status: PROGRESSING   2.  Patient will be able to perform all daily tasks without pain or difficulty to improve overall function and QOL. Baseline: see above Goal status: MET   3.  Patient will be able to demonstrate good active sitting posture to reduce incidence of pain Baseline: unable Goal status: MET         PLAN:   PT FREQUENCY: 1-2x/week for total of 6 visits over 8 week certification    PT DURATION: 8 weeks   PLANNED INTERVENTIONS: Therapeutic exercises, Therapeutic activity, Neuromuscular re-education, Balance training, Gait training, Patient/Family education, Self Care, Joint mobilization, Joint manipulation, Vestibular training, Canalith repositioning, Orthotic/Fit training, DME instructions, Aquatic Therapy, Dry Needling, Electrical stimulation, Spinal manipulation, Spinal mobilization, Cryotherapy, Moist heat, Traction, Ultrasound, Ionotophoresis 4mg /ml Dexamethasone, Manual therapy, and Re-evaluation   PLAN FOR NEXT SESSION: strengthening      11:06 AM, 04/07/23 Tereasa Coop, DPT Physical Therapy with Dolores Lory

## 2023-04-07 NOTE — Telephone Encounter (Signed)
Faxed additional info to [employer] to document change of therapy.  Key: [id number]

## 2023-04-08 ENCOUNTER — Other Ambulatory Visit (HOSPITAL_COMMUNITY): Payer: Self-pay

## 2023-04-08 NOTE — Telephone Encounter (Signed)
Pt called. I informed her of message nurse [facility] sent.

## 2023-04-08 NOTE — Telephone Encounter (Signed)
Pharmacy Patient Advocate Encounter  Prior Authorization for [employer] has been approved by Tenna Child Jari Favre) (ins).    PA # PA Case ID #: [id number] Effective dates: 04/07/2023 through 10/07/2023

## 2023-04-08 NOTE — Telephone Encounter (Signed)
Phone room: please call pt and let them know PA [employer] approved. They should now be able to pick up from the pharmacy

## 2023-04-14 ENCOUNTER — Encounter: Payer: 59 | Admitting: Physical Therapy

## 2023-04-15 ENCOUNTER — Ambulatory Visit: Payer: 59 | Admitting: Physical Therapy

## 2023-04-15 ENCOUNTER — Encounter: Payer: Self-pay | Admitting: Physical Therapy

## 2023-04-15 DIAGNOSIS — M6281 Muscle weakness (generalized): Secondary | ICD-10-CM | POA: Diagnosis not present

## 2023-04-15 DIAGNOSIS — M542 Cervicalgia: Secondary | ICD-10-CM

## 2023-04-15 NOTE — Therapy (Signed)
OUTPATIENT PHYSICAL THERAPY TREATMENT NOTE      Patient Name: Cathy Vasquez MRN: [id number] DOB:[birth year], 32 y.o., female Today's Date: 04/15/2023  PCP: Willow Ora, MD   REFERRING PROVIDER: Rodolph Bong, MD  END OF SESSION:   PT End of Session - 04/15/23 1515     Visit Number 11    Number of Visits 15    Date for PT Re-Evaluation 05/28/23    Authorization Type [employer]    Authorization - Visit Number 11    Authorization - Number of Visits 30    Progress Note Due on Visit 19    PT Start Time 1515    PT Stop Time 1555    PT Time Calculation (min) 40 min    Activity Tolerance Patient tolerated treatment well    Behavior During Therapy WFL for tasks assessed/performed             Past Medical History:  Diagnosis Date   Aortic aneurysm (HCC)    Aortic stenosis    Atopic eczema 10/15/2018   Gastroesophageal reflux disease without esophagitis 12/29/2015   - discussed lifestyle changes - prescribed ranitidine 12/29/15 Rx Zantac 150 mg # 60 BID with refills x 5 given(01/19/16)   Heart disease    Heart murmur    Migraines    Past Surgical History:  Procedure Laterality Date   CARDIAC SURGERY  05/17/2003   Post Ross Procedure   ROSS [facility] PROCEDURE     VENOUS ABLATION Right 2020   varicose veins/venous insufficiency   WISDOM TOOTH EXTRACTION     Patient Active Problem List   Diagnosis Date Noted   SBE (subacute bacterial endocarditis) prophylaxis candidate 12/13/2022   Notalgia paresthetica 12/13/2022   DUB (dysfunctional uterine bleeding) 12/13/2022   Carotid occlusion, right 08/07/2022   Positive D dimer 06/12/2022   Migraine, ophthalmoplegic 11/09/2019   Migraine with aura    Atopic eczema 10/15/2018   Dyshidrotic eczema 10/15/2018   Venous insufficiency (chronic) (peripheral) 07/07/2018   Congenital aortic stenosis 11/07/2017   Non-rheumatic mitral regurgitation 03/31/2017   MVP (mitral valve prolapse) 03/31/2017   Ascending aorta  dilatation (HCC) 03/28/2017   H/O Ross procedure 08/25/2016   Varicose veins of right leg with edema 07/25/2016    THERAPY DIAG:  Cervicalgia  Muscle weakness (generalized)  REFERRING DIAG: N82.956 (ICD-10-CM) - Neck muscle spasm M54.6 (ICD-10-CM) - Pain in thoracic spine   Rationale for Evaluation and Treatment: Rehabilitation   ONSET DATE: 2 years ago   SUBJECTIVE:  SUBJECTIVE STATEMENT: 04/15/2023 States she got her exercise ball and feels like the counter exercise is going to help her the most. States she does all the other exercises too. States she hasn't felt tight except today due to waking up multiple times for her son. Otherwise doing well.   Eval: States that initially she hurt her back after she went to a theme park and rode some spinning rides with her kids and carrying a backpack on her back. This eventually resolved so she didn't seek PT at the time. Pain returned last year 10/10/22 and continued for all the month of December. Pain is better now but she doesn't want pain to return. States she still has uneasiness with certain movements/activities.     States that it usually is painful on the right but sometimes it goes to the left. States she can't lay on the side that hurts. When she uses her arm it more she feels like it will set it off.  States she doesn't really exercise but wants to do better. Pain with breathing at times   PERTINENT HISTORY:  open heart surgeries x69 one at 88 weeks old and 32 years old-has, heart aneurysm. 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 no:     PRECAUTIONS: None   WEIGHT BEARING RESTRICTIONS: No   FALLS:  Has patient fallen in last 6 months? No     OCCUPATION: stay at home mom, home schools    PLOF:  Independent   PATIENT GOALS: reduce incidence of pain    OBJECTIVE:    DIAGNOSTIC FINDINGS:  Xray 12/23/22 T spine FINDINGS: Curvature of the thoracolumbar spine, apex to the left. No other malalignment. No fractures. No significant degenerative changes. No other bony or soft tissue abnormalities.   IMPRESSION: Curvature of the thoracolumbar spine, apex to the right. No other abnormalities.     CERVICAL ROM:    Active ROM A/PROM (deg) 04/02/23  Flexion WFL*  Extension WFL  Right lateral flexion WFL  Left lateral flexion WFL*  Right rotation WFL  Left rotation WFL*   (Blank rows = not tested) *tight/tender on left side               UE Measurements       Upper Extremity Right 04/02/23 Left 04/02/23    A/PROM MMT A/PROM MMT  Shoulder Flexion 185 4 185 4  Shoulder Extension          Shoulder Abduction WFL 4 WFL 4  Shoulder Adduction          Shoulder Internal Rotation Reaches to T12 SP 4- Reaches to T6 SP (winging) 4-*  Shoulder External Rotation Reaches to T6 SP 4 Reaches T6 SP  4  Elbow Flexion          Elbow Extension          Wrist Flexion          Wrist Extension          Wrist Supination          Wrist Pronation          Wrist Ulnar Deviation          Wrist Radial Deviation          Grip Strength NA   NA                          (Blank rows =  not tested)                       * pain                       Right handed       TODAY'S TREATMENT:                                                                                                                              DATE: 04/15/2023  Therapeutic Exercise:    standing: wall clock 4x8 B RTB, lateral wall walks with band around arms 2x5 B RTB, plank Plus 2x5 5" holds Quad: Seated: Standing:   Neuromuscular Re-education  Manual Therapy:  Self Care: Trigger Point Dry Needling:  Modalities:    PATIENT EDUCATION:  Education details: on HEP, reviewed how to use medbridge app Person educated:  Patient Education method: Programmer, multimedia, Facilities manager, and Handouts Education comprehension: verbalized understanding     HOME EXERCISE PROGRAM: [id number]   ASSESSMENT:   CLINICAL IMPRESSION: 04/15/2023 Answered all questions about current exercise program and reviewed MedBridge app and how to use for improved adherence to home exercise program.  Added new exercises to HEP.  Soreness in muscles noted afterwards but no increase in pain.  Longer rest breaks secondary to difficulty of exercises.  Frequent verbal and tactile cues throughout session to perform.  Overall patient is doing very well and would continue to benefit from skilled physical therapy at this time.  Eval: Patient presents with episodic mid thoracic pain that radiates up into neck and shoulder. Current episode is resolving but would like to address causes of her pain to reduce episodes and severity of pain. Patient demonstrates good cervical and UE motion but limitations in thoracic motion and generalized weakness throughout likely contributing to current condition. Patent's history of open heart surgery also likely contributing to current presentation. Patient would greatly benefit from skilled PT to improve overall function and QOL.    OBJECTIVE IMPAIRMENTS: decreased activity tolerance, decreased ROM, decreased strength, improper body mechanics, postural dysfunction, and pain.    ACTIVITY LIMITATIONS: lifting, bending, and reach over head   PARTICIPATION LIMITATIONS: meal prep, cleaning, laundry, and shopping   PERSONAL FACTORS: Age, Fitness, Time since onset of injury/illness/exacerbation, and 1 comorbidity: hx of x2 open heart surgery (last when she was 33 years old)  are also affecting patient's functional outcome.    REHAB POTENTIAL: Good   CLINICAL DECISION MAKING: Stable/uncomplicated   EVALUATION COMPLEXITY: Low     GOALS: Goals reviewed with patient? yes   SHORT TERM GOALS: Target date: 02/06/2023  Patient will  be independent in self management strategies to improve quality of life and functional outcomes. Baseline: New Program Goal status: PROGRESSING    2.  Patient will report at least 50% improvement in overall symptoms and/or function to demonstrate improved functional mobility Baseline: 0% better Goal status: MET   3.  Patient will be able to lift plates into cabinet without pain or discomfort to improve ability to perform daily activities  Baseline: painful Goal status: MET    4.  Patient will be able to demonstrate good diaphragmatic breath to reduce anterior/upper chest breathing  Baseline: chest breather Goal status: MET       LONG TERM GOALS: Target date: 03/06/2023    Patient will report at least 75% improvement in overall symptoms and/or function to demonstrate improved functional mobility Baseline: 0% better Goal status: PROGRESSING   2.  Patient will be able to perform all daily tasks without pain or difficulty to improve overall function and QOL. Baseline: see above Goal status: MET   3.  Patient will be able to demonstrate good active sitting posture to reduce incidence of pain Baseline: unable Goal status: MET         PLAN:   PT FREQUENCY: 1-2x/week for total of 6 visits over 8 week certification    PT DURATION: 8 weeks   PLANNED INTERVENTIONS: Therapeutic exercises, Therapeutic activity, Neuromuscular re-education, Balance training, Gait training, Patient/Family education, Self Care, Joint mobilization, Joint manipulation, Vestibular training, Canalith repositioning, Orthotic/Fit training, DME instructions, Aquatic Therapy, Dry Needling, Electrical stimulation, Spinal manipulation, Spinal mobilization, Cryotherapy, Moist heat, Traction, Ultrasound, Ionotophoresis 4mg /ml Dexamethasone, Manual therapy, and Re-evaluation   PLAN FOR NEXT SESSION: strengthening      4:01 PM, 04/15/23 Tereasa Coop, DPT Physical Therapy with Dolores Lory

## 2023-04-21 ENCOUNTER — Encounter: Payer: Self-pay | Admitting: Physician Assistant

## 2023-04-21 ENCOUNTER — Ambulatory Visit (INDEPENDENT_AMBULATORY_CARE_PROVIDER_SITE_OTHER): Payer: 59 | Admitting: Physician Assistant

## 2023-04-21 VITALS — BP 118/75 | HR 68 | Temp 97.4°F | Ht 61.0 in | Wt 113.4 lb

## 2023-04-21 DIAGNOSIS — N644 Mastodynia: Secondary | ICD-10-CM | POA: Diagnosis not present

## 2023-04-21 DIAGNOSIS — R2231 Localized swelling, mass and lump, right upper limb: Secondary | ICD-10-CM

## 2023-04-21 NOTE — Progress Notes (Signed)
Cathy Vasquez is a 32 y.o. female here for a follow up of a pre-existing problem.  History of Present Illness:   Chief Complaint  Patient presents with   Transfer of care   Arm Pain    Pt c/o a lump under right arm pit, Present for a year. Pt states it is  slightly sensitive   Patient is here to transfer care from a different provider in my office. She has extensive cardiac history and we reviewed this today.   She is in good health at this time.  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 and lives in [place]   Bilateral breast pain and right axillary lump Patient has noticed a lump in her right axillary since July of last year She does not feel as though this area has gotten bigger or smaller Area is not tender in particular  Both breasts b/l nipple discharge last month for 3-4 days April 1st developed breast pain right > left soreness in outer breasts and axilla, experience this regularly until her period started on April 13  She had similar pattern of pain this month but instead of pain for 13 days she only had pain for 9 days prior to onset of period  No family history of breast cancer She did breast-feed both of her children  Drinks caffeine - 1 cup coffee in AM, may be an additional cup if needed   Past Medical History:  Diagnosis Date   Aortic aneurysm (HCC)    Aortic stenosis    Atopic eczema 10/15/2018   Gastroesophageal reflux disease without esophagitis 12/29/2015   - discussed lifestyle changes - prescribed ranitidine 12/29/15 Rx Zantac 150 mg # 60 BID with refills x 5 given(01/19/16)   Heart disease    Heart murmur    Migraines      Social History   Tobacco Use   Smoking status: Never   Smokeless tobacco: Never  Vaping Use   Vaping Use: Never used  Substance Use Topics   Alcohol use: Not Currently   Drug use: Not Currently    Past Surgical History:  Procedure Laterality Date   CARDIAC  SURGERY  05/17/2003   Post Ross Procedure   ROSS [facility] PROCEDURE     VENOUS ABLATION Right 2020   varicose veins/venous insufficiency   WISDOM TOOTH EXTRACTION      Family History  Problem Relation Age of Onset   Hypertension Mother    Hypertension Father    Healthy Sister    Healthy Sister    Heart disease Maternal Grandmother    Diabetes Paternal Grandfather    Healthy Daughter    Healthy Son    Migraines Maternal Aunt     Allergies  Allergen Reactions   Other Anaphylaxis    Tree Nuts   Amoxicillin Rash   Cefaclor Hives and Rash   Penicillin G Rash    Current Medications:   Current Outpatient Medications:    cetirizine (ZYRTEC) 10 MG tablet, Take 10 mg by mouth daily., Disp: , Rfl:    clindamycin (CLEOCIN) 300 MG capsule, Take 2 capsules 30-60 minutes before your appointment as needed, Disp: 10 capsule, Rfl: 1   Multiple Vitamins-Minerals (WOMENS MULTIVITAMIN PO), Take by mouth., Disp: , Rfl:    Rimegepant Sulfate (NURTEC) 75 MG TBDP, Take 1 tablet (75 mg total) by mouth as needed., Disp: 8 tablet, Rfl: 6   tiZANidine (ZANAFLEX) 4 MG tablet, Take  0.5-1 tablets (2-4 mg total) by mouth every 8 (eight) hours as needed for muscle spasms., Disp: 30 tablet, Rfl: 1   triamcinolone ointment (KENALOG) 0.1 %, 1 APP ONCE A DAY AT BEDTIME as needed APPLIED TOPICALLY, Disp: 80 g, Rfl: 0   Review of Systems:   ROS Negative unless otherwise specified per HPI.  Vitals:   Vitals:   04/21/23 1357  BP: 118/75  Pulse: 68  Temp: (!) 97.4 F (36.3 C)  TempSrc: Temporal  SpO2: 100%  Weight: 113 lb 6.4 oz (51.4 kg)  Height: 5\' 1"  (1.549 m)     Body mass index is 21.43 kg/m.  Physical Exam:   Physical Exam Vitals and nursing note reviewed.  Constitutional:      General: She is not in acute distress.    Appearance: She is well-developed. She is not ill-appearing or toxic-appearing.  Cardiovascular:     Rate and Rhythm: Normal rate and regular rhythm.     Pulses: Normal  pulses.     Heart sounds: S1 normal and S2 normal. Murmur heard.  Pulmonary:     Effort: Pulmonary effort is normal.     Breath sounds: Normal breath sounds.  Skin:    General: Skin is warm and dry.     Comments: Right axilla with small palpable lump  Neurological:     Mental Status: She is alert.     GCS: GCS eye subscore is 4. GCS verbal subscore is 5. GCS motor subscore is 6.  Psychiatric:        Speech: Speech normal.        Behavior: Behavior normal. Behavior is cooperative.     Assessment and Plan:   Breast pain; Axillary lump, right Given chronicity of symptoms will obtain imaging for further evaluation and management  I spent a total of 45 minutes on this visit, today 04/21/23, which included reviewing previous notes from PCP on 12/13/22 and blood work and Cardiology note from Dr Eden Emms on 08/07/22, ordering tests, discussing plan of care with patient and using shared-decision making on next steps, refilling medications, and documenting the findings in the note.  Jarold Motto, PA-C

## 2023-04-21 NOTE — Patient Instructions (Signed)
It was great to see you!  I have placed referral for imaging of your breasts  If you do not hear about an appointment within the next 1 to 2 weeks, please let us know  Take care,  Jarold Motto PA-C

## 2023-04-28 ENCOUNTER — Encounter: Payer: 59 | Admitting: Physical Therapy

## 2023-04-28 ENCOUNTER — Encounter: Payer: 59 | Admitting: Physician Assistant

## 2023-05-01 ENCOUNTER — Encounter: Payer: Self-pay | Admitting: Physician Assistant

## 2023-05-01 ENCOUNTER — Other Ambulatory Visit (HOSPITAL_COMMUNITY): Payer: Self-pay | Admitting: Physician Assistant

## 2023-05-01 DIAGNOSIS — N644 Mastodynia: Secondary | ICD-10-CM

## 2023-05-06 ENCOUNTER — Ambulatory Visit (HOSPITAL_COMMUNITY)
Admission: RE | Admit: 2023-05-06 | Discharge: 2023-05-06 | Disposition: A | Discharge status: Home / Self Care | Payer: 59 | Source: Ambulatory Visit | Attending: Physician Assistant | Admitting: Physician Assistant

## 2023-05-06 ENCOUNTER — Ambulatory Visit: Payer: 59 | Admitting: Physical Therapy

## 2023-05-06 ENCOUNTER — Encounter (HOSPITAL_COMMUNITY): Payer: Self-pay

## 2023-05-06 ENCOUNTER — Encounter: Payer: Self-pay | Admitting: Physical Therapy

## 2023-05-06 DIAGNOSIS — N644 Mastodynia: Secondary | ICD-10-CM | POA: Insufficient documentation

## 2023-05-06 DIAGNOSIS — M542 Cervicalgia: Secondary | ICD-10-CM

## 2023-05-06 DIAGNOSIS — M6281 Muscle weakness (generalized): Secondary | ICD-10-CM

## 2023-05-06 NOTE — Therapy (Addendum)
OUTPATIENT PHYSICAL THERAPY TREATMENT NOTE    PHYSICAL THERAPY DISCHARGE SUMMARY  Visits from Start of Care: 12  Current functional level related to goals / functional outcomes: Could not be reassessed secondary to unplanned discharge   Remaining deficits: Could not be reassessed secondary to unplanned discharge   Education / Equipment: Could not be reassessed secondary to unplanned discharge   Patient agrees to discharge. Patient goals were partially met. Patient is being discharged due to not returning since the last visit.  1:40 PM, 07/17/23 Tereasa Coop, DPT Physical Therapy with [place]    Patient Name: Cathy Vasquez MRN: [id number] DOB:08-26-1991, 32 y.o., female Today's Date: 05/06/2023  PCP: Willow Ora, MD   REFERRING PROVIDER: Rodolph Bong, MD  END OF SESSION:   PT End of Session - 05/06/23 1601     Visit Number 12    Number of Visits 15    Date for PT Re-Evaluation 05/28/23    Authorization Type [employer]    Authorization - Visit Number 12    Authorization - Number of Visits 30    Progress Note Due on Visit 19    PT Start Time 1602    PT Stop Time 1640    PT Time Calculation (min) 38 min    Activity Tolerance Patient tolerated treatment well    Behavior During Therapy WFL for tasks assessed/performed             Past Medical History:  Diagnosis Date   Aortic aneurysm (HCC)    Aortic stenosis    Atopic eczema 10/15/2018   Gastroesophageal reflux disease without esophagitis 12/29/2015   - discussed lifestyle changes - prescribed ranitidine 12/29/15 Rx Zantac 150 mg # 60 BID with refills x 5 given(01/19/16)   Heart disease    Heart murmur    Migraines    Past Surgical History:  Procedure Laterality Date   CARDIAC SURGERY  05/17/2003   Post Ross Procedure   ROSS [facility] PROCEDURE  2004   VENOUS ABLATION Right 2020   varicose veins/venous insufficiency   WISDOM TOOTH EXTRACTION     Patient Active Problem List   Diagnosis  Date Noted   SBE (subacute bacterial endocarditis) prophylaxis candidate 12/13/2022   Notalgia paresthetica 12/13/2022   DUB (dysfunctional uterine bleeding) 12/13/2022   Carotid occlusion, right 08/07/2022   Positive D dimer 06/12/2022   Migraine, ophthalmoplegic 11/09/2019   Migraine with aura    Atopic eczema 10/15/2018   Dyshidrotic eczema 10/15/2018   Venous insufficiency (chronic) (peripheral) 07/07/2018   Congenital aortic stenosis 11/07/2017   Non-rheumatic mitral regurgitation 03/31/2017   MVP (mitral valve prolapse) 03/31/2017   Ascending aorta dilatation (HCC) 03/28/2017   H/O Ross procedure 08/25/2016   Varicose veins of right leg with edema 07/25/2016    THERAPY DIAG:  Cervicalgia  Muscle weakness (generalized)  REFERRING DIAG: W09.811 (ICD-10-CM) - Neck muscle spasm M54.6 (ICD-10-CM) - Pain in thoracic spine   Rationale for Evaluation and Treatment: Rehabilitation   ONSET DATE: 2 years ago   SUBJECTIVE:  SUBJECTIVE STATEMENT: 05/06/2023 States overall things are going well. States she has been doing her exercises and she can feel them really working. States that she did a lot over the weekend and had not pain or difficulties.   Eval: States that initially she hurt her back after she went to a theme park and rode some spinning rides with her kids and carrying a backpack on her back. This eventually resolved so she didn't seek PT at the time. Pain returned last year 10/10/22 and continued for all the month of December. Pain is better now but she doesn't want pain to return. States she still has uneasiness with certain movements/activities.     States that it usually is painful on the right but sometimes it goes to the left. States she can't lay on the side that hurts.  When she uses her arm it more she feels like it will set it off.  States she doesn't really exercise but wants to do better. Pain with breathing at times   PERTINENT HISTORY:  open heart surgeries x33 one at 64 weeks old and 32 years old-has, heart aneurysm. 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 no:     PRECAUTIONS: None   WEIGHT BEARING RESTRICTIONS: No   FALLS:  Has patient fallen in last 6 months? No     OCCUPATION: stay at home mom, home schools    PLOF: Independent   PATIENT GOALS: reduce incidence of pain    OBJECTIVE:    DIAGNOSTIC FINDINGS:  Xray 12/23/22 T spine FINDINGS: Curvature of the thoracolumbar spine, apex to the left. No other malalignment. No fractures. No significant degenerative changes. No other bony or soft tissue abnormalities.   IMPRESSION: Curvature of the thoracolumbar spine, apex to the right. No other abnormalities.     CERVICAL ROM:    Active ROM A/PROM (deg) 04/02/23  Flexion WFL*  Extension WFL  Right lateral flexion WFL  Left lateral flexion WFL*  Right rotation WFL  Left rotation WFL*   (Blank rows = not tested) *tight/tender on left side               UE Measurements       Upper Extremity Right 04/02/23 Left 04/02/23    A/PROM MMT A/PROM MMT  Shoulder Flexion 185 4 185 4  Shoulder Extension          Shoulder Abduction WFL 4 WFL 4  Shoulder Adduction          Shoulder Internal Rotation Reaches to T12 SP 4- Reaches to T6 SP (winging) 4-*  Shoulder External Rotation Reaches to T6 SP 4 Reaches T6 SP  4  Elbow Flexion          Elbow Extension          Wrist Flexion          Wrist Extension          Wrist Supination          Wrist Pronation          Wrist Ulnar Deviation          Wrist Radial Deviation          Grip Strength NA   NA                          (Blank rows = not tested)                       *  pain                       Right handed       TODAY'S TREATMENT:                                                                                                                               DATE: 05/06/2023 Therapeutic Exercise:    standing: pallof press 2x15 B slow and controlled RTB Quad: shoulder taps 2 x10 B, plank x5 20" holds, shoulder extension 2x15 5" holds, rows 2x15 5" holds, lat pull dows 3x10 RTB Seated: Standing:   Neuromuscular Re-education  Manual Therapy:  Self Care: Trigger Point Dry Needling:  Modalities:    PATIENT EDUCATION:  Education details: on HEP Person educated: Patient Education method: Explanation, Facilities manager, and Handouts Education comprehension: verbalized understanding     HOME EXERCISE PROGRAM: [id number]   ASSESSMENT:   CLINICAL IMPRESSION: 05/06/2023 Progressed exercises and reviewed HEP. Overall patient is doing well and able to tolerate full plank on this date. Fatigue noted end of session. Tactile and verbal cues for increased scapular protraction in UE weight bearing position. Provided patient with red band for HEP compliance. Overall patient doing well and would continue to benefit from PT at this time.   Eval: Patient presents with episodic mid thoracic pain that radiates up into neck and shoulder. Current episode is resolving but would like to address causes of her pain to reduce episodes and severity of pain. Patient demonstrates good cervical and UE motion but limitations in thoracic motion and generalized weakness throughout likely contributing to current condition. Patent's history of open heart surgery also likely contributing to current presentation. Patient would greatly benefit from skilled PT to improve overall function and QOL.    OBJECTIVE IMPAIRMENTS: decreased activity tolerance, decreased ROM, decreased strength, improper body mechanics, postural dysfunction, and pain.    ACTIVITY LIMITATIONS: lifting, bending, and reach over head   PARTICIPATION LIMITATIONS: meal prep, cleaning, laundry, and shopping    PERSONAL FACTORS: Age, Fitness, Time since onset of injury/illness/exacerbation, and 1 comorbidity: hx of x2 open heart surgery (last when she was 32 years old)  are also affecting patient's functional outcome.    REHAB POTENTIAL: Good   CLINICAL DECISION MAKING: Stable/uncomplicated   EVALUATION COMPLEXITY: Low     GOALS: Goals reviewed with patient? yes   SHORT TERM GOALS: Target date: 02/06/2023  Patient will be independent in self management strategies to improve quality of life and functional outcomes. Baseline: New Program Goal status: PROGRESSING    2.  Patient will report at least 50% improvement in overall symptoms and/or function to demonstrate improved functional mobility Baseline: 0% better Goal status: MET   3.  Patient will be able to lift plates into cabinet without pain or discomfort to improve ability to perform daily activities  Baseline: painful Goal status: MET    4.  Patient will be able to demonstrate  good diaphragmatic breath to reduce anterior/upper chest breathing  Baseline: chest breather Goal status: MET       LONG TERM GOALS: Target date: 03/06/2023    Patient will report at least 75% improvement in overall symptoms and/or function to demonstrate improved functional mobility Baseline: 0% better Goal status: PROGRESSING   2.  Patient will be able to perform all daily tasks without pain or difficulty to improve overall function and QOL. Baseline: see above Goal status: MET   3.  Patient will be able to demonstrate good active sitting posture to reduce incidence of pain Baseline: unable Goal status: MET         PLAN:   PT FREQUENCY: 1-2x/week for total of 6 visits over 8 week certification    PT DURATION: 8 weeks   PLANNED INTERVENTIONS: Therapeutic exercises, Therapeutic activity, Neuromuscular re-education, Balance training, Gait training, Patient/Family education, Self Care, Joint mobilization, Joint manipulation, Vestibular training,  Canalith repositioning, Orthotic/Fit training, DME instructions, Aquatic Therapy, Dry Needling, Electrical stimulation, Spinal manipulation, Spinal mobilization, Cryotherapy, Moist heat, Traction, Ultrasound, Ionotophoresis 4mg /ml Dexamethasone, Manual therapy, and Re-evaluation   PLAN FOR NEXT SESSION: strengthening      4:42 PM, 05/06/23 Tereasa Coop, DPT Physical Therapy with [facility]

## 2023-06-30 ENCOUNTER — Telehealth: Payer: Self-pay | Admitting: Neurology

## 2023-06-30 NOTE — Telephone Encounter (Signed)
I spoke with Cathy Vasquez --- she had a severe migraine and took Nurtec 2 hours ago.  HA is better but not resolved and she asked about a second dose.  As pain continues to improve, I advised her to hold off another 30-60 minutes but if still significant pain, may take a second one.

## 2023-07-10 DIAGNOSIS — Z419 Encounter for procedure for purposes other than remedying health state, unspecified: Secondary | ICD-10-CM | POA: Diagnosis not present

## 2023-07-14 NOTE — Progress Notes (Signed)
Cathy Vasquez is a 32 y.o. female here for a new problem.  History of Present Illness:   Chief Complaint  Patient presents with  . Acne    Pt c/o acne around sides of face, chin area, will not go away. Pt has been using OTC medications but nothing works.    HPI  Cystic Face Acne Notes facial bumps for several months. Bumps are concentrated along jaw-line, chin. The bumps are painful. Has been using antibacterial soap, Differin, benzoyl peroxide. She is in day 21 of her menstrual cycle, according to a tracker she uses in her phone.  Wonders if it is hormonally related. Tries not to touch her face during the day. She uses basic moisturizer and daily sunscreen. Has washed bed linens thoroughly. Has never been diagnosed with PCOS. Did not have gestational diabetes. Has had intermittent lightheadedness that seems to occur after meals since she was a child.  Right Eye Redness Has improved after switching to xyzal Right eye does not become watery but the eye and eyelids do get intermittently red.  Past Medical History:  Diagnosis Date  . Aortic aneurysm (HCC)   . Aortic stenosis   . Atopic eczema 10/15/2018  . Gastroesophageal reflux disease without esophagitis 12/29/2015   - discussed lifestyle changes - prescribed ranitidine 12/29/15 Rx Zantac 150 mg # 60 BID with refills x 5 given(01/19/16)  . Heart disease   . Heart murmur   . Migraines      Social History   Tobacco Use  . Smoking status: Never  . Smokeless tobacco: Never  Vaping Use  . Vaping status: Never Used  Substance Use Topics  . Alcohol use: Not Currently  . Drug use: Not Currently    Past Surgical History:  Procedure Laterality Date  . CARDIAC SURGERY  05/17/2003   Post Ross Procedure  . ROSS [facility] PROCEDURE  2004  . VENOUS ABLATION Right 2020   varicose veins/venous insufficiency  . WISDOM TOOTH EXTRACTION      Family History  Problem Relation Age of Onset  . Hypertension Mother   . Hypertension  Father   . Healthy Sister   . Healthy Sister   . Heart disease Maternal Grandmother   . Diabetes Paternal Grandfather   . Healthy Daughter   . Healthy Son   . Migraines Maternal Aunt     Allergies  Allergen Reactions  . Other Anaphylaxis    Tree Nuts  . Amoxicillin Rash  . Cefaclor Hives and Rash  . Penicillin G Rash    Current Medications:   Current Outpatient Medications:  .  clindamycin (CLEOCIN) 300 MG capsule, Take 2 capsules 30-60 minutes before your appointment as needed, Disp: 10 capsule, Rfl: 1 .  levocetirizine (XYZAL) 5 MG tablet, Take 2.5 mg by mouth as needed for allergies., Disp: , Rfl:  .  Rimegepant Sulfate (NURTEC) 75 MG TBDP, Take 1 tablet (75 mg total) by mouth as needed., Disp: 8 tablet, Rfl: 6 .  tiZANidine (ZANAFLEX) 4 MG tablet, Take 0.5-1 tablets (2-4 mg total) by mouth every 8 (eight) hours as needed for muscle spasms., Disp: 30 tablet, Rfl: 1 .  triamcinolone ointment (KENALOG) 0.1 %, 1 APP ONCE A DAY AT BEDTIME as needed APPLIED TOPICALLY, Disp: 80 g, Rfl: 0 .  Multiple Vitamins-Minerals (WOMENS MULTIVITAMIN PO), Take by mouth., Disp: , Rfl:    Review of Systems:   Review of Systems  Constitutional:  Negative for fever and malaise/fatigue.  HENT:  Negative for congestion.  Eyes:  Positive for redness (Right). Negative for blurred vision.  Respiratory:  Negative for cough and shortness of breath.   Cardiovascular:  Negative for chest pain, palpitations and leg swelling.  Gastrointestinal:  Negative for vomiting.  Musculoskeletal:  Negative for back pain.  Skin:  Negative for rash.       (+) Bumps face  Neurological:  Negative for loss of consciousness and headaches.    Vitals:   Vitals:   07/16/23 0843  BP: 120/80  Pulse: 70  Temp: (!) 97.5 F (36.4 C)  TempSrc: Temporal  SpO2: 98%  Weight: 117 lb (53.1 kg)  Height: 5\' 1"  (1.549 m)     Body mass index is 22.11 kg/m.  Physical Exam:   Physical Exam Constitutional:       Appearance: Normal appearance. She is well-developed.  HENT:     Head: Normocephalic and atraumatic.  Eyes:     General: Lids are normal.     Extraocular Movements: Extraocular movements intact.     Conjunctiva/sclera: Conjunctivae normal.  Pulmonary:     Effort: Pulmonary effort is normal.  Musculoskeletal:        General: Normal range of motion.     Cervical back: Normal range of motion and neck supple.  Skin:    General: Skin is warm and dry.     Comments: Scattered pustules to chin and upper neck  Neurological:     Mental Status: She is alert and oriented to person, place, and time.  Psychiatric:        Attention and Perception: Attention and perception normal.        Mood and Affect: Mood normal.        Behavior: Behavior normal.        Thought Content: Thought content normal.        Judgment: Judgment normal.     Assessment and Plan:   Acne vulgaris No red flags Stop antibacterial soap as facewash Start clindamycin topical gel and oral doxycycline 100 mg for 2-4 weeks Handout provided Dermatology referral placed  Allergy, initial encounter Recommend pataday drops as needed May trial topical hydrocortisone cream to upper eyelid as needed for redness - may follow-up with dermatology if needed for this issue   I,Alexander Ruley,acting as a scribe for [employer], PA.,have documented all relevant documentation on the behalf of Jarold Motto, PA,as directed by  Jarold Motto, PA while in the presence of Jarold Motto, [place].   I, Jarold Motto, [place], have reviewed all documentation for this visit. The documentation on 07/16/23 for the exam, diagnosis, procedures, and orders are all accurate and complete.    Jarold Motto, PA-C

## 2023-07-16 ENCOUNTER — Encounter: Payer: Self-pay | Admitting: Physician Assistant

## 2023-07-16 ENCOUNTER — Ambulatory Visit (INDEPENDENT_AMBULATORY_CARE_PROVIDER_SITE_OTHER): Payer: 59 | Admitting: Physician Assistant

## 2023-07-16 VITALS — BP 120/80 | HR 70 | Temp 97.5°F | Ht 61.0 in | Wt 117.0 lb

## 2023-07-16 DIAGNOSIS — T7840XA Allergy, unspecified, initial encounter: Secondary | ICD-10-CM

## 2023-07-16 DIAGNOSIS — L700 Acne vulgaris: Secondary | ICD-10-CM | POA: Diagnosis not present

## 2023-07-16 MED ORDER — CLINDAMYCIN PHOSPHATE 1 % EX GEL: Freq: Two times a day (BID) | CUTANEOUS | 0 refills | Status: DC

## 2023-07-16 MED ORDER — CLINDAMYCIN HCL 300 MG PO CAPS: ORAL_CAPSULE | ORAL | 1 refills | Status: AC

## 2023-07-16 MED ORDER — OLOPATADINE HCL 0.1 % OP SOLN: 1.0000 [drp] | Freq: Two times a day (BID) | OPHTHALMIC | 12 refills | Status: DC | PRN

## 2023-07-16 MED ORDER — DOXYCYCLINE HYCLATE 100 MG PO TABS: 100.0000 mg | ORAL_TABLET | Freq: Every day | ORAL | 0 refills | Status: DC

## 2023-07-16 MED ORDER — CLINDAMYCIN PHOS-BENZOYL PEROX 1-5 % EX GEL: Freq: Two times a day (BID) | CUTANEOUS | 0 refills | Status: DC

## 2023-07-16 NOTE — Patient Instructions (Signed)
It was great to see you!  Start benza-clin gel twice daily Hold all benzoyl peroxide topical treatments while using this  add doxycycline 100 mg daily for 2-4 weeks  Use the new eye drop that I sent in for your eye irritation  Referral to dermatology  Take care,  Jarold Motto PA-C

## 2023-08-10 DIAGNOSIS — Z419 Encounter for procedure for purposes other than remedying health state, unspecified: Secondary | ICD-10-CM | POA: Diagnosis not present

## 2023-08-12 ENCOUNTER — Other Ambulatory Visit: Payer: Self-pay | Admitting: Physician Assistant

## 2023-09-09 DIAGNOSIS — Z419 Encounter for procedure for purposes other than remedying health state, unspecified: Secondary | ICD-10-CM | POA: Diagnosis not present

## 2023-09-10 ENCOUNTER — Encounter: Payer: Self-pay | Admitting: Cardiovascular Disease

## 2023-09-10 ENCOUNTER — Ambulatory Visit: Payer: 59 | Attending: Cardiovascular Disease | Admitting: Cardiovascular Disease

## 2023-09-10 VITALS — BP 116/70 | HR 82 | Ht 61.0 in | Wt 117.0 lb

## 2023-09-10 DIAGNOSIS — R9439 Abnormal result of other cardiovascular function study: Secondary | ICD-10-CM | POA: Diagnosis not present

## 2023-09-10 DIAGNOSIS — Z954 Presence of other heart-valve replacement: Secondary | ICD-10-CM

## 2023-09-10 DIAGNOSIS — I340 Nonrheumatic mitral (valve) insufficiency: Secondary | ICD-10-CM

## 2023-09-10 DIAGNOSIS — R002 Palpitations: Secondary | ICD-10-CM | POA: Diagnosis not present

## 2023-09-10 MED ORDER — BISOPROLOL FUMARATE 5 MG PO TABS: 2.5000 mg | ORAL_TABLET | ORAL | 2 refills | Status: AC | PRN

## 2023-09-10 NOTE — Patient Instructions (Addendum)
Medication Instructions:  Your physician has recommended you make the following change in your medication:  1-TAKE Bisoprolol 2.5 mg by mouth as needed for palpitations.  *If you need a refill on your cardiac medications before your next appointment, please call your pharmacy*  Lab Work: If you have labs (blood work) drawn today and your tests are completely normal, you will receive your results only by: MyChart Message (if you have MyChart) OR A paper copy in the mail If you have any lab test that is abnormal or we need to change your treatment, we will call you to review the results.  Testing/Procedures: Your physician has requested that you have a carotid duplex in July. This test is an ultrasound of the carotid arteries in your neck. It looks at blood flow through these arteries that supply the brain with blood. Allow one hour for this exam. There are no restrictions or special instructions.  Follow-Up: At [facility], you and your health needs are our priority.  As part of our continuing mission to provide you with exceptional heart care, we have created designated Provider Care Teams.  These Care Teams include your primary Cardiologist (physician) and Advanced Practice Providers (APPs -  Physician Assistants and Nurse Practitioners) who all work together to provide you with the care you need, when you need it.  We recommend signing up for the patient portal called "MyChart".  Sign up information is provided on this After Visit Summary.  MyChart is used to connect with patients for Virtual Visits (Telemedicine).  Patients are able to view lab/test results, encounter notes, upcoming appointments, etc.  Non-urgent messages can be sent to your provider as well.   To learn more about what you can do with MyChart, go to [email or web address].    Your next appointment:   1 year(s)  Provider:   Charlton Haws, MD     Other Instructions

## 2023-09-12 DIAGNOSIS — H5213 Myopia, bilateral: Secondary | ICD-10-CM | POA: Diagnosis not present

## 2023-10-07 ENCOUNTER — Encounter: Payer: Self-pay | Admitting: Dermatology

## 2023-10-07 ENCOUNTER — Ambulatory Visit: Payer: 59 | Admitting: Dermatology

## 2023-10-07 VITALS — BP 135/84 | HR 68

## 2023-10-07 DIAGNOSIS — L578 Other skin changes due to chronic exposure to nonionizing radiation: Secondary | ICD-10-CM

## 2023-10-07 DIAGNOSIS — L814 Other melanin hyperpigmentation: Secondary | ICD-10-CM

## 2023-10-07 DIAGNOSIS — L700 Acne vulgaris: Secondary | ICD-10-CM

## 2023-10-07 DIAGNOSIS — D229 Melanocytic nevi, unspecified: Secondary | ICD-10-CM

## 2023-10-07 MED ORDER — CLINDAMYCIN PHOSPHATE 1 % EX GEL: Freq: Two times a day (BID) | CUTANEOUS | 2 refills | Status: DC

## 2023-10-07 MED ORDER — TRETINOIN 0.025 % EX CREA: TOPICAL_CREAM | Freq: Every day | CUTANEOUS | 2 refills | Status: AC

## 2023-10-07 NOTE — Progress Notes (Signed)
   New Patient Visit   Subjective  Kerryanne Coletti is a 32 y.o. female who presents for the following: acne  Patient states she has acne located at the jaw line as well as a few spots on her body that she would like to have examined that she believed was hormonal. Patient reports the areas have been going off and on for 1 year. She reports the areas are bothersome.Patient rates irritation 9 out of 10, especially when they are deep and doesn't come to a head. She states that the areas have not spread. Patient reports she has previously been treated for these areas by PCP and was prescribed Doxycycline & Clindamycin gel. She also uses Panoxyl once daily for acne. Patient denies Hx of bx. Patient reports family history of skin cancer(s). Her maternal aunt has had melanomas removed.  The patient has spots, moles and lesions to be evaluated, some may be new or changing and the patient may have concern these could be cancer.   The following portions of the chart were reviewed this encounter and updated as appropriate: medications, allergies, medical history  Review of Systems:  No other skin or systemic complaints except as noted in HPI or Assessment and Plan.  Objective  Well appearing patient in no apparent distress; mood and affect are within normal limits.   A focused examination was performed of the following areas: TBSE   Relevant exam findings are noted in the Assessment and Plan.               Assessment & Plan   ACNE VULGARIS Exam: Open comedones and inflammatory papules  Not at goal  Treatment Plan: - Will prescribe Tretinoin 0.025%to use M, W, F at nightly    LENTIGINES, SEBORRHEIC KERATOSES, HEMANGIOMAS - Benign normal skin lesions - Benign-appearing - Call for any changes  MELANOCYTIC NEVI - Tan-brown and/or pink-flesh-colored symmetric macules and papules - Benign appearing on exam today - Observation - Call clinic for new or changing moles - Recommend daily  use of broad spectrum spf 30+ sunscreen to sun-exposed areas.   ACTINIC DAMAGE - Chronic condition, secondary to cumulative UV/sun exposure - diffuse scaly erythematous macules with underlying dyspigmentation - Recommend daily broad spectrum sunscreen SPF 30+ to sun-exposed areas, reapply every 2 hours as needed.  - Staying in the shade or wearing long sleeves, sun glasses (UVA+UVB protection) and wide brim hats (4-inch brim around the entire circumference of the hat) are also recommended for sun protection.  - Call for new or changing lesions.  SKIN CANCER SCREENING PERFORMED TODAY     Return in about 3 months (around 01/07/2024) for acne follow up.    Documentation: I have reviewed the above documentation for accuracy and completeness, and I agree with the above.   I, Shirron Marcha Solders, CMA, am acting as scribe for [employer], DO.   Langston Reusing, DO

## 2023-10-07 NOTE — Patient Instructions (Addendum)
Hello Cathy Vasquez,  Thank you for visiting my office today. Your dedication to enhancing your skin health and addressing your concerns is greatly appreciated.  Here is a summary of the key instructions from today's consultation:  - Morning Routine:   - Begin by washing your face with Panoxyl BPO wash.   - Apply Clindamycin following the wash.   - Finish with CeraVe lotion to moisturize.  - Evening Routine:   - Wash your face again with Vanicream.   - On Monday, Wednesday, and Friday evenings, use a pea-sized amount of Tretinoin 0.025%, and then apply CeraVe moisturizer.   - On the other evenings, your routine should only include washing and moisturizing.  - Additional Instructions:   - Should you experience excessive dryness, adjust the Tretinoin application to twice a week.   - In the summer, if your skin can tolerate it, increase Tretinoin usage to five nights a week.   - Emphasize moisturizing around sensitive areas such as the eyes, mouth, and neck to prevent dryness and peeling.   - Maintain hydration by drinking adequate water to avoid dehydration and cramping.   - Discontinue Tretinoin and Clindamycin if planning for pregnancy or upon confirmation of pregnancy.  - Monitoring:   - We will keep an eye on the pigmented mole on your toe by taking photographs every six months.   - You should expect to see gradual improvements in your skin condition every four weeks.  - Follow-Up:   - We have scheduled a return visit in three months to evaluate your progress.  Your visit summary and detailed instructions have been printed out for you and are also accessible through your MyChart.  Thank you once again for your visit today. I am looking forward to our next meeting in three months to further our progress.  Warm regards,  Dr. Langston Reusing Dermatology Important Information  Due to recent changes in healthcare laws, you may see results of your pathology and/or laboratory studies on  MyChart before the doctors have had a chance to review them. We understand that in some cases there may be results that are confusing or concerning to you. Please understand that not all results are received at the same time and often the doctors may need to interpret multiple results in order to provide you with the best plan of care or course of treatment. Therefore, we ask that you please give [place] 2 business days to thoroughly review all your results before contacting the office for clarification. Should we see a critical lab result, you will be contacted sooner.   If You Need Anything After Your Visit  If you have any questions or concerns for your doctor, please call our main line at [phone] If no one answers, please leave a voicemail as directed and we will return your call as soon as possible. Messages left after 4 pm will be answered the following business day.   You may also send [place] a message via MyChart. We typically respond to MyChart messages within 1-2 business days.  For prescription refills, please ask your pharmacy to contact our office. Our fax number is [phone].  If you have an urgent issue when the clinic is closed that cannot wait until the next business day, you can page your doctor at the number below.    Please note that while we do our best to be available for urgent issues outside of office hours, we are not available 24/7.   If you have an  urgent issue and are unable to reach [place], you may choose to seek medical care at your doctor's office, retail clinic, urgent care center, or emergency room.  If you have a medical emergency, please immediately call 911 or go to the emergency department. In the event of inclement weather, please call our main line at [phone] for an update on the status of any delays or closures.  Dermatology Medication Tips: Please keep the boxes that topical medications come in in order to help keep track of the instructions about where  and how to use these. Pharmacies typically print the medication instructions only on the boxes and not directly on the medication tubes.   If your medication is too expensive, please contact our office at [phone] or send [place] a message through MyChart.   We are unable to tell what your co-pay for medications will be in advance as this is different depending on your insurance coverage. However, we may be able to find a substitute medication at lower cost or fill out paperwork to get insurance to cover a needed medication.   If a prior authorization is required to get your medication covered by your insurance company, please allow [place] 1-2 business days to complete this process.  Drug prices often vary depending on where the prescription is filled and some pharmacies may offer cheaper prices.  The website www.goodrx.com contains coupons for medications through different pharmacies. The prices here do not account for what the cost may be with help from insurance (it may be cheaper with your insurance), but the website can give you the price if you did not use any insurance.  - You can print the associated coupon and take it with your prescription to the pharmacy.  - You may also stop by our office during regular business hours and pick up a GoodRx coupon card.  - If you need your prescription sent electronically to a different pharmacy, notify our office through [facility] or by phone at [phone]

## 2023-10-10 DIAGNOSIS — Z419 Encounter for procedure for purposes other than remedying health state, unspecified: Secondary | ICD-10-CM | POA: Diagnosis not present

## 2023-11-09 DIAGNOSIS — Z419 Encounter for procedure for purposes other than remedying health state, unspecified: Secondary | ICD-10-CM | POA: Diagnosis not present

## 2023-11-12 DIAGNOSIS — H5203 Hypermetropia, bilateral: Secondary | ICD-10-CM | POA: Diagnosis not present

## 2023-11-24 ENCOUNTER — Ambulatory Visit (INDEPENDENT_AMBULATORY_CARE_PROVIDER_SITE_OTHER): Payer: 59 | Admitting: Physician Assistant

## 2023-11-24 ENCOUNTER — Encounter: Payer: Self-pay | Admitting: Physician Assistant

## 2023-11-24 VITALS — BP 136/82 | HR 82 | Temp 97.5°F | Wt 119.8 lb

## 2023-11-24 DIAGNOSIS — R1084 Generalized abdominal pain: Secondary | ICD-10-CM

## 2023-11-24 LAB — POCT URINE PREGNANCY: Preg Test, Ur: NEGATIVE

## 2023-11-24 NOTE — Patient Instructions (Signed)
It was great to see you!  If any worsening abdominal pain or symptom(s), GO TO THE ER  Tomorrow, go to [facility] -- Radiology Department Be there at 1145a to drink  contrast and the scan will be some time after 2  No food 4 hrs prior can have small amounts on water     Take care,  Jarold Motto PA-C

## 2023-11-24 NOTE — Progress Notes (Signed)
Cathy Vasquez is a 32 y.o. female here for a new problem.  History of Present Illness:   Chief Complaint  Patient presents with   low blood pressure     Started late 12/13 early 12/14 with abdominal pain starting at under breast going into lower abdomen, seeing spots, BP 65/42 mmhg, 48 BPM at 315 AM.     HPI  Low Blood Pressure/Abdominal Pain Complains of low blood pressure associated with seeing spots that began late 12/13 and early 12/14.   Notes that she had a migraine Friday, 12/13, which she treated with her Nurtec.  Also had fried chicken for dinner.   Reports that Saturday 12/14 she woke up around 3:50a from severe abdominal pain from below her ribs diffuse to her lower abdomen.  Used her KardiaMobile at 3:50 after getting up, which resulted with HR at 48.  Afterwards, she went to the kitchen to get some water and began seeing spots, but she did not lose consciousness.  Used KardiaMobile again with HR at 51; took her BP which resulted 65/42, and HR 48 again.  After calling her sister-in-law, who is a Engineer, civil (consulting), she took her BP again which was around 120 systolic.  Then the pain began to slightly improve and became localized to her lower abdomen.  For the rest of Saturday she had lower abdominal tenderness.   Sunday, 12/15, she didn't have any abdominal pain but did have yellowish diarrhea.   Today she did not have a bowel movement this morning, which is unusual for her.   History of -- aortic root dilation and lower extremity (superficial) blood clot.   During orthostatics she endorsed mild sharp left-sided abdominal pain from stand to sit and seeing spots from laying down to sitting-up.  Endorses moderate tenderness to palpation of lower abdomen and bloating.  Also does have a low-grade fever of 99, but notes that is normal for her when she is about to start her period.  Denies urinary/vaginal symptoms or abdominal pain while at rest.   Her cardiac problems are followed by  Vivien Presto, MD with cardiology and she did not contact him about this.   BP Readings from Last 3 Encounters:  11/24/23 136/82  10/07/23 135/84  09/10/23 116/70   Past Medical History:  Diagnosis Date   Aortic aneurysm (HCC)    Aortic stenosis    Atopic eczema 10/15/2018   Gastroesophageal reflux disease without esophagitis 12/29/2015   - discussed lifestyle changes - prescribed ranitidine 12/29/15 Rx Zantac 150 mg # 60 BID with refills x 5 given(01/19/16)   Heart disease    Heart murmur    Migraines     Social History   Tobacco Use   Smoking status: Never   Smokeless tobacco: Never  Vaping Use   Vaping status: Never Used  Substance Use Topics   Alcohol use: Not Currently   Drug use: Not Currently   Past Surgical History:  Procedure Laterality Date   CARDIAC SURGERY  05/17/2003   Post Ross Procedure   ROSS [facility] PROCEDURE  2004   VENOUS ABLATION Right 2020   varicose veins/venous insufficiency   WISDOM TOOTH EXTRACTION     Family History  Problem Relation Age of Onset   Hypertension Mother    Hypertension Father    Healthy Sister    Healthy Sister    Heart disease Maternal Grandmother    Diabetes Paternal Actor    Healthy Daughter    Healthy Son    Migraines Maternal Aunt  Allergies  Allergen Reactions   Other Anaphylaxis    Tree Nuts   Amoxicillin Rash   Cefaclor Hives and Rash   Penicillin G Rash   Current Medications:   Current Outpatient Medications:    Benzoyl Peroxide (PANOXYL WASH EX), Apply topically., Disp: , Rfl:    bisoprolol (ZEBETA) 5 MG tablet, Take 0.5 tablets (2.5 mg total) by mouth as needed (palpitations)., Disp: 30 tablet, Rfl: 2   clindamycin (CLEOCIN) 300 MG capsule, Take 2 capsules 30-60 minutes before your appointment as needed, Disp: 10 capsule, Rfl: 1   clindamycin (CLINDAGEL) 1 % gel, Apply topically 2 (two) times daily., Disp: 30 g, Rfl: 2   doxycycline (VIBRA-TABS) 100 MG tablet, TAKE 1 TABLET BY MOUTH EVERY DAY, Disp:  30 tablet, Rfl: 0   levocetirizine (XYZAL) 5 MG tablet, Take 2.5 mg by mouth as needed for allergies., Disp: , Rfl:    Multiple Vitamins-Minerals (WOMENS MULTIVITAMIN PO), Take by mouth., Disp: , Rfl:    olopatadine (PATADAY) 0.1 % ophthalmic solution, Place 1 drop into the right eye 2 (two) times daily as needed for allergies., Disp: 5 mL, Rfl: 12   Rimegepant Sulfate (NURTEC) 75 MG TBDP, Take 1 tablet (75 mg total) by mouth as needed., Disp: 8 tablet, Rfl: 6   tiZANidine (ZANAFLEX) 4 MG tablet, Take 0.5-1 tablets (2-4 mg total) by mouth every 8 (eight) hours as needed for muscle spasms., Disp: 30 tablet, Rfl: 1   tretinoin (RETIN-A) 0.025 % cream, Apply topically at bedtime. Use only on Mon, Wed, Fri, Disp: 45 g, Rfl: 2   triamcinolone ointment (KENALOG) 0.1 %, 1 APP ONCE A DAY AT BEDTIME as needed APPLIED TOPICALLY, Disp: 80 g, Rfl: 0  Review of Systems:   ROS See pertinent positives and negatives as per the HPI.  Vitals:   Vitals:   11/24/23 1506  BP: 136/82  Pulse: 82  Temp: (!) 97.5 F (36.4 C)  TempSrc: Temporal  SpO2: 100%  Weight: 119 lb 12.8 oz (54.3 kg)     Body mass index is 22.64 kg/m.  Orthostatic VS for the past 24 hrs (Last 3 readings):  BP- Lying Pulse- Lying BP- Sitting Pulse- Sitting BP- Standing at 0 minutes Pulse- Standing at 0 minutes BP- Standing at 3 minutes Pulse- Standing at 3 minutes  11/24/23 1515 121/76 66 143/76 71 144/81 78 158/78 88     Physical Exam:   Physical Exam Vitals and nursing note reviewed.  Constitutional:      General: She is not in acute distress.    Appearance: She is well-developed. She is not ill-appearing or toxic-appearing.  Cardiovascular:     Rate and Rhythm: Normal rate and regular rhythm.     Pulses: Normal pulses.     Heart sounds: Normal heart sounds, S1 normal and S2 normal.  Pulmonary:     Effort: Pulmonary effort is normal.     Breath sounds: Normal breath sounds.  Abdominal:     General: Abdomen is flat. Bowel  sounds are normal.     Palpations: Abdomen is soft.     Tenderness: There is abdominal tenderness in the right lower quadrant, suprapubic area and left lower quadrant.  Skin:    General: Skin is warm and dry.  Neurological:     Mental Status: She is alert.     GCS: GCS eye subscore is 4. GCS verbal subscore is 5. GCS motor subscore is 6.  Psychiatric:        Speech: Speech normal.  Behavior: Behavior normal. Behavior is cooperative.    Results for orders placed or performed in visit on 11/24/23  POCT urine pregnancy  Result Value Ref Range   Preg Test, Ur Negative Negative     Assessment and Plan:   Abdominal pain, generalized She is quite tender on exam  Will order stat CT abdomen/pelvis to rule out appendicitis, ovarian cyst rupture, diverticulitis, mesenteric infarct, among others Will also message her cardiologist about this pain as she has complex cardiac history and want to make sure nothing from a cardiology standpoint is contributing to symptom(s)  She was told to go to the ER if any new/worsening symptom(s)  Urine pregnancy negative    I,Emily Lagle,acting as a Neurosurgeon for [employer], PA.,have documented all relevant documentation on the behalf of Jarold Motto, PA,as directed by  Jarold Motto, PA while in the presence of Jarold Motto, [place].  I, Jarold Motto, [place], have reviewed all documentation for this visit. The documentation on 11/24/23 for the exam, diagnosis, procedures, and orders are all accurate and complete.  I spent a total of 70 minutes on this visit, today 11/24/23, which included reviewing previous notes from cardiology, ordering tests, discussing plan of care with patient and using shared-decision making on next steps, refilling medications, and documenting the findings in the note.   Jarold Motto, PA-C

## 2023-11-25 ENCOUNTER — Ambulatory Visit (HOSPITAL_COMMUNITY)
Admission: RE | Admit: 2023-11-25 | Discharge: 2023-11-25 | Disposition: A | Discharge status: Home / Self Care | Payer: 59 | Source: Ambulatory Visit | Attending: Physician Assistant | Admitting: Physician Assistant

## 2023-11-25 DIAGNOSIS — R1084 Generalized abdominal pain: Secondary | ICD-10-CM | POA: Diagnosis present

## 2023-11-25 LAB — URINALYSIS, ROUTINE W REFLEX MICROSCOPIC
Bilirubin Urine: NEGATIVE
Hgb urine dipstick: NEGATIVE
Leukocytes,Ua: NEGATIVE
Nitrite: NEGATIVE
RBC / HPF: NONE SEEN (ref 0–?)
Specific Gravity, Urine: <=1.005 — AB (ref 1.000–1.030)
Total Protein, Urine: NEGATIVE
Urine Glucose: NEGATIVE
Urobilinogen, UA: 0.2 (ref 0.0–1.0)
pH: 6 (ref 5.0–8.0)

## 2023-11-25 LAB — COMPREHENSIVE METABOLIC PANEL
ALT: 14 U/L (ref 0–35)
AST: 18 U/L (ref 0–37)
Albumin: 4.6 g/dL (ref 3.5–5.2)
Alkaline Phosphatase: 29 U/L — ABNORMAL LOW (ref 39–117)
BUN: 11 mg/dL (ref 6–23)
CO2: 26 meq/L (ref 19–32)
Calcium: 9.5 mg/dL (ref 8.4–10.5)
Chloride: 101 meq/L (ref 96–112)
Creatinine, Ser: 0.74 mg/dL (ref 0.40–1.20)
GFR: 106.85 mL/min (ref >60.00)
Glucose, Bld: 81 mg/dL (ref 70–99)
Potassium: 3.7 meq/L (ref 3.5–5.1)
Sodium: 136 meq/L (ref 135–145)
Total Bilirubin: 0.6 mg/dL (ref 0.2–1.2)
Total Protein: 7.5 g/dL (ref 6.0–8.3)

## 2023-11-25 LAB — CBC WITH DIFFERENTIAL/PLATELET
Basophils Absolute: 0.1 10*3/uL (ref 0.0–0.1)
Basophils Relative: 0.8 % (ref 0.0–3.0)
Eosinophils Absolute: 0.1 10*3/uL (ref 0.0–0.7)
Eosinophils Relative: 0.7 % (ref 0.0–5.0)
HCT: 41.4 % (ref 36.0–46.0)
Hemoglobin: 14.2 g/dL (ref 12.0–15.0)
Lymphocytes Relative: 28.1 % (ref 12.0–46.0)
Lymphs Abs: 2.2 10*3/uL (ref 0.7–4.0)
MCHC: 34.3 g/dL (ref 30.0–36.0)
MCV: 95.6 fL (ref 78.0–100.0)
Monocytes Absolute: 0.5 10*3/uL (ref 0.1–1.0)
Monocytes Relative: 6.1 % (ref 3.0–12.0)
Neutro Abs: 5.2 10*3/uL (ref 1.4–7.7)
Neutrophils Relative %: 64.3 % (ref 43.0–77.0)
Platelets: 214 10*3/uL (ref 150.0–400.0)
RBC: 4.34 Mil/uL (ref 3.87–5.11)
RDW: 13.4 % (ref 11.5–15.5)
WBC: 8 10*3/uL (ref 4.0–10.5)

## 2023-11-25 LAB — URINE CULTURE
MICRO NUMBER:: 15854947
Result:: NO GROWTH
SPECIMEN QUALITY:: ADEQUATE

## 2023-11-25 LAB — LIPASE: Lipase: 19 U/L (ref 11.0–59.0)

## 2023-11-25 MED ORDER — IOHEXOL 300 MG/ML  SOLN
100.0000 mL | Freq: Once | INTRAMUSCULAR | Status: AC | PRN
  Administered 2023-11-25: 100 mL via INTRAVENOUS

## 2023-11-26 ENCOUNTER — Encounter: Payer: Self-pay | Admitting: Physician Assistant

## 2023-11-26 DIAGNOSIS — N83201 Unspecified ovarian cyst, right side: Secondary | ICD-10-CM

## 2023-11-26 DIAGNOSIS — N2881 Hypertrophy of kidney: Secondary | ICD-10-CM

## 2023-11-26 NOTE — Telephone Encounter (Signed)
Noted  

## 2023-11-26 NOTE — Telephone Encounter (Signed)
Cathy Vasquez, please see message about referrals.

## 2023-12-04 ENCOUNTER — Telehealth: Payer: Self-pay | Admitting: Physician Assistant

## 2023-12-04 NOTE — Telephone Encounter (Signed)
Copied from CRM [id number]. Topic: Referral - Status >> Dec 04, 2023  1:43 PM Gaetano Hawthorne wrote: Reason for CRM: Patient is calling to report that her nephrology referral looks like it got faxed or received by a different office.   The referral letter that she received was for:  Referring To Provider Information [facility] [address] [place] [address] [phone]   And a different practice (with no association with the above) called her [facility] in [place], [place]) - this is not where the patient wishes to go.   Can we send the referral for the [facility] in [place], [place]? Please call patient with confirmation.

## 2023-12-10 DIAGNOSIS — Z419 Encounter for procedure for purposes other than remedying health state, unspecified: Secondary | ICD-10-CM | POA: Diagnosis not present

## 2023-12-11 ENCOUNTER — Encounter: Payer: Self-pay | Admitting: Obstetrics and Gynecology

## 2023-12-12 ENCOUNTER — Ambulatory Visit: Payer: 59 | Admitting: Obstetrics and Gynecology

## 2023-12-16 ENCOUNTER — Encounter: Payer: Self-pay | Admitting: Family Medicine

## 2023-12-16 ENCOUNTER — Encounter: Payer: Medicaid Other | Admitting: Physician Assistant

## 2024-01-06 ENCOUNTER — Ambulatory Visit (INDEPENDENT_AMBULATORY_CARE_PROVIDER_SITE_OTHER): Payer: 59 | Admitting: Dermatology

## 2024-01-06 ENCOUNTER — Encounter: Payer: Self-pay | Admitting: Dermatology

## 2024-01-06 VITALS — BP 112/72 | HR 67

## 2024-01-06 DIAGNOSIS — R238 Other skin changes: Secondary | ICD-10-CM

## 2024-01-06 DIAGNOSIS — L209 Atopic dermatitis, unspecified: Secondary | ICD-10-CM

## 2024-01-06 DIAGNOSIS — L743 Miliaria, unspecified: Secondary | ICD-10-CM

## 2024-01-06 DIAGNOSIS — L309 Dermatitis, unspecified: Secondary | ICD-10-CM

## 2024-01-06 DIAGNOSIS — L720 Epidermal cyst: Secondary | ICD-10-CM

## 2024-01-06 MED ORDER — ELIDEL 1 % EX CREA
TOPICAL_CREAM | Freq: Two times a day (BID) | CUTANEOUS | 5 refills | Status: DC
Start: 2024-01-06 — End: 2024-01-15

## 2024-01-06 NOTE — Patient Instructions (Addendum)
Hello Cathy Vasquez,  Thank you for visiting [place] today.  Here is a summary of the key instructions from today's consultation:  Morning Routine: Continue using clindamycin gel in the morning.  Evening Routine: Apply tretinoin on Monday, Wednesday, and Friday nights.  Moisturizer: Use as needed to manage any dryness.   Blackhead Removal: Incorporate a silicone scrubber for washing your face to assist with blackheads.  Exfoliation: Use an exfoliating toner with glycolic acid once a week overnight, followed by hyaluronic acid.  Summer Care: Consider switching to a salicylic acid-based product and upgrading to Arazlo (tazarotene) for your tretinoin regimen during summer.  Hand Protection: Wear gloves during household cleaning to protect your hands.  Hand Care: Use triamcinolone for 2 weeks if needed, then switch to tacrolimus for long-term management. Apply Tacrolimus twice daily until clear  Next Visit Reminder: Remember to address the benign milia in your next visit in June.   Please do not hesitate to reach out if you have any questions or concerns before our next appointment.  Warm regards,  Dr. Langston Reusing Dermatology          Important Information   Due to recent changes in healthcare laws, you may see results of your pathology and/or laboratory studies on MyChart before the doctors have had a chance to review them. We understand that in some cases there may be results that are confusing or concerning to you. Please understand that not all results are received at the same time and often the doctors may need to interpret multiple results in order to provide you with the best plan of care or course of treatment. Therefore, we ask that you please give [place] 2 business days to thoroughly review all your results before contacting the office for clarification. Should we see a critical lab result, you will be contacted sooner.     If You Need Anything After Your Visit   If you have any  questions or concerns for your doctor, please call our main line at [phone]. If no one answers, please leave a voicemail as directed and we will return your call as soon as possible. Messages left after 4 pm will be answered the following business day.    You may also send [place] a message via MyChart. We typically respond to MyChart messages within 1-2 business days.  For prescription refills, please ask your pharmacy to contact our office. Our fax number is [phone].  If you have an urgent issue when the clinic is closed that cannot wait until the next business day, you can page your doctor at the number below.     Please note that while we do our best to be available for urgent issues outside of office hours, we are not available 24/7.    If you have an urgent issue and are unable to reach [place], you may choose to seek medical care at your doctor's office, retail clinic, urgent care center, or emergency room.   If you have a medical emergency, please immediately call 911 or go to the emergency department. In the event of inclement weather, please call our main line at [phone] for an update on the status of any delays or closures.  Dermatology Medication Tips: Please keep the boxes that topical medications come in in order to help keep track of the instructions about where and how to use these. Pharmacies typically print the medication instructions only on the boxes and not directly on the medication tubes.   If your medication  is too expensive, please contact our office at [phone] or send [place] a message through MyChart.    We are unable to tell what your co-pay for medications will be in advance as this is different depending on your insurance coverage. However, we may be able to find a substitute medication at lower cost or fill out paperwork to get insurance to cover a needed medication.    If a prior authorization is required to get your medication covered by your insurance  company, please allow [place] 1-2 business days to complete this process.   Drug prices often vary depending on where the prescription is filled and some pharmacies may offer cheaper prices.   The website www.goodrx.com contains coupons for medications through different pharmacies. The prices here do not account for what the cost may be with help from insurance (it may be cheaper with your insurance), but the website can give you the price if you did not use any insurance.  - You can print the associated coupon and take it with your prescription to the pharmacy.  - You may also stop by our office during regular business hours and pick up a GoodRx coupon card.  - If you need your prescription sent electronically to a different pharmacy, notify our office through [facility] or by phone at [phone]

## 2024-01-06 NOTE — Progress Notes (Signed)
   Follow-Up Visit   Subjective  Cathy Vasquez is a 33 y.o. female who presents for the following: Acne  Patient present today for follow up visit for Acne. Patient was last evaluated on 10/07/23. At this visit patient was prescribed Tretinoin 0.025% to use M, W, F nightly. She reports she is using Panoxly 4%BPO wash in the am and occasionally at night and Vani cream most nights. Patient reports sxs are  improving  but she recently started experiencing excessive dryness so she cut back to 2 nights(Starting this week)0 She would also like to discuss treatments for the deeper blackheads and hormonal acne that happens at there chin. Patient denies medication changes.  Patient also would like to discuss hand eczema. She is currently prescribed TMC to apply at the times of a flare. She feels it works but would like to discuss if there are any other options. She has been using the [facility] for several years. She is currently moisturizing with Jergens & CeraVe lotion to moisturize.  The following portions of the chart were reviewed this encounter and updated as appropriate: medications, allergies, medical history  Review of Systems:  No other skin or systemic complaints except as noted in HPI or Assessment and Plan.  Objective  Well appearing patient in no apparent distress; mood and affect are within normal limits.  A focused examination was performed of the following areas: Face and Hands  Relevant exam findings are noted in the Assessment and Plan.                    Assessment & Plan   ACNE VULGARIS Exam: Open comedones and inflammatory papules  Well Controlled  Treatment Plan: - Recommended continuing the Tretinoin 0.025% 2-3 nights weekly depending on dryness - Recommended continuing current facial wash regimen, Recommended using silicone facial scrubber to mechanically exfoliate the skin - For deeper acne, recommended on the weekend using Glycolic acid in the colder months weekly  or bi weekly -   ATOPIC DERMATITIS Exam: Scaly pink papules coalescing to plaques, special site involved including hand 2% BSA  Well controlled   Atopic dermatitis (eczema) is a chronic, relapsing, pruritic condition that can significantly affect quality of life. It is often associated with allergic rhinitis and/or asthma and can require treatment with topical medications, phototherapy, or in severe cases biologic injectable medication (Dupixent; Adbry) or Oral JAK inhibitors.  Treatment Plan: - We will plan to prescribe Pimecrolimus to apply to the hands 2 times daily or PRN until Hand Eczema  - Recommend gentle skin care  Milia  - tiny firm white papules - type of cyst - benign - sometimes these will clear with nightly OTC adapalene/Differin 0.1% gel or retinol. - may be extracted if symptomatic - observe    No follow-ups on file.  Documentation: I have reviewed the above documentation for accuracy and completeness, and I agree with the above.  Stasia Cavalier, am acting as scribe for Langston Reusing, DO.  Langston Reusing, DO

## 2024-01-07 ENCOUNTER — Encounter: Payer: Self-pay | Admitting: Dermatology

## 2024-01-07 DIAGNOSIS — L209 Atopic dermatitis, unspecified: Secondary | ICD-10-CM

## 2024-01-09 ENCOUNTER — Ambulatory Visit (INDEPENDENT_AMBULATORY_CARE_PROVIDER_SITE_OTHER): Payer: Medicaid Other | Admitting: Physician Assistant

## 2024-01-09 ENCOUNTER — Encounter: Payer: Self-pay | Admitting: Physician Assistant

## 2024-01-09 VITALS — BP 110/70 | HR 70 | Temp 97.2°F | Ht 61.0 in | Wt 119.0 lb

## 2024-01-09 DIAGNOSIS — L700 Acne vulgaris: Secondary | ICD-10-CM | POA: Diagnosis not present

## 2024-01-09 DIAGNOSIS — Z0000 Encounter for general adult medical examination without abnormal findings: Secondary | ICD-10-CM | POA: Diagnosis not present

## 2024-01-09 DIAGNOSIS — E785 Hyperlipidemia, unspecified: Secondary | ICD-10-CM

## 2024-01-09 DIAGNOSIS — E559 Vitamin D deficiency, unspecified: Secondary | ICD-10-CM | POA: Diagnosis not present

## 2024-01-09 DIAGNOSIS — G43119 Migraine with aura, intractable, without status migrainosus: Secondary | ICD-10-CM

## 2024-01-09 LAB — LIPID PANEL
Cholesterol: 188 mg/dL (ref 0–200)
HDL: 56.2 mg/dL (ref >39.00)
LDL Cholesterol: 109 mg/dL — ABNORMAL HIGH (ref 0–99)
NonHDL: 131.63
Total CHOL/HDL Ratio: 3
Triglycerides: 112 mg/dL (ref 0.0–149.0)
VLDL: 22.4 mg/dL (ref 0.0–40.0)

## 2024-01-09 LAB — VITAMIN D 25 HYDROXY (VIT D DEFICIENCY, FRACTURES): VITD: 23.42 ng/mL — ABNORMAL LOW (ref 30.00–100.00)

## 2024-01-09 NOTE — Patient Instructions (Signed)
 It was great to see you!  Please go to the lab for blood work.   Our office will call you with your results unless you have chosen to receive results via MyChart.  If your blood work is normal we will follow-up each year for physicals and as scheduled for chronic medical problems.  If anything is abnormal we will treat accordingly and get you in for a follow-up.  Take care,  Cathy Vasquez

## 2024-01-09 NOTE — Progress Notes (Signed)
Subjective:    Cathy Vasquez is a 33 y.o. female and is here for a comprehensive physical exam.  HPI  Health Maintenance Due  Topic Date Due   Pneumococcal Vaccine 42-19 Years old (1 of 2 - PCV) Never done    Acute Concerns: None.  Chronic Issues: Cystic acne: Pt is using Cleocin, clindagel, ad Retin-a as prescribed.  States this is overall well controlled.  Continues to follow up with dermatology.   Migraines: Pt has been taking Nurtec as needed. States that this past month she did not have a migraine.  In the past few months she has been getting migraines at least once a month.  Previously had migraines more frequently.    Health Maintenance: Immunizations -- UTD Colonoscopy -- N/a Mammogram -- Had diagnostic mammogram on 05/06/23. Per results, benign-appearing lymph nodes were identified.  PAP -- Overdue. Last done 11/14/2020. Results were normal.  Bone Density -- N/a Diet -- Eating healthy overall.    Exercise -- As able.  Sleep habits -- No concerns.  Mood -- Stable.  UTD with dentist? - yes UTD with eye doctor? - yes  Weight history: Wt Readings from Last 10 Encounters:  01/09/24 119 lb (54 kg)  11/24/23 119 lb 12.8 oz (54.3 kg)  09/10/23 117 lb (53.1 kg)  07/16/23 117 lb (53.1 kg)  04/21/23 113 lb 6.4 oz (51.4 kg)  03/04/23 114 lb 3.2 oz (51.8 kg)  02/24/23 114 lb (51.7 kg)  12/23/22 118 lb (53.5 kg)  12/13/22 117 lb (53.1 kg)  11/04/22 119 lb 6 oz (54.1 kg)   Body mass index is 22.48 kg/m. Patient's last menstrual period was 01/01/2024 (exact date).  Alcohol use:  reports no history of alcohol use.  Tobacco use:  Tobacco Use: Low Risk  (01/09/2024)   Patient History    Smoking Tobacco Use: Never    Smokeless Tobacco Use: Never    Passive Exposure: Not on file   Eligible for lung cancer screening? no     01/09/2024    9:06 AM  Depression screen PHQ 2/9  Decreased Interest 0  Down, Depressed, Hopeless 0  PHQ - 2 Score 0  Altered  sleeping 0  Tired, decreased energy 0  Change in appetite 0  Feeling bad or failure about yourself  0  Trouble concentrating 0  Moving slowly or fidgety/restless 0  Suicidal thoughts 0  PHQ-9 Score 0     Other providers/specialists: Patient Care Team: Jarold Motto, [place] as PCP - General (Physician Assistant) Wendall Stade, MD as PCP - Cardiology (Cardiology) Elana Alm, MD as Attending Physician (Internal Medicine) Clovis Riley, MD as Referring Physician (Cardiology) Elmon Else, MD as Consulting Physician (Dermatology) Manning Charity, OD as Referring Physician (Optometry) Donalee Citrin, MD as Consulting Physician (Neurosurgery) Gaylord Shih, [place] (Specialist) Irene Limbo, DDS as Referring Physician (Dentistry) Beryle Flock, PT as Physical Therapist (Physical Therapy) Ginette Otto, [facility]    PMHx, SurgHx, SocialHx, Medications, and Allergies were reviewed in the Visit Navigator and updated as appropriate.   Past Medical History:  Diagnosis Date   Allergy    Tree nuts   Aortic aneurysm (HCC)    Aortic stenosis    Atopic eczema 10/15/2018   Gastroesophageal reflux disease without esophagitis 12/29/2015   - discussed lifestyle changes - prescribed ranitidine 12/29/15 Rx Zantac 150 mg # 60 BID with refills x 5 given(01/19/16)   Heart disease    Heart murmur    Migraines  Past Surgical History:  Procedure Laterality Date   CARDIAC SURGERY  05/17/2003   Post [facility] Procedure   CARDIAC VALVE REPLACEMENT     [facility] PROCEDURE  2004   VENOUS ABLATION Right 2020   varicose veins/venous insufficiency   WISDOM TOOTH EXTRACTION       Family History  Problem Relation Age of Onset   Hypertension Mother    Arthritis Mother        2 knee surgeries   Hypertension Father    Hyperlipidemia Father    Healthy Sister    Healthy Sister    Heart disease Maternal Grandmother    Valvular heart disease Maternal Grandmother    Alcohol abuse Maternal Grandfather     Suicidality Maternal Grandfather    Dementia Paternal Grandmother    COPD Paternal Grandmother    Diabetes Paternal Actor    Healthy Daughter    Healthy Son    Migraines Maternal Aunt     Social History   Tobacco Use   Smoking status: Never   Smokeless tobacco: Never  Vaping Use   Vaping status: Never Used  Substance Use Topics   Alcohol use: Never   Drug use: Never    Review of Systems:   Review of Systems  Constitutional:  Negative for chills, fever, malaise/fatigue and weight loss.  HENT:  Negative for hearing loss, sinus pain and sore throat.   Respiratory:  Negative for cough and hemoptysis.   Cardiovascular:  Negative for chest pain, palpitations, leg swelling and PND.  Gastrointestinal:  Negative for abdominal pain, constipation, diarrhea, heartburn, nausea and vomiting.  Genitourinary:  Negative for dysuria, frequency and urgency.  Musculoskeletal:  Negative for back pain, myalgias and neck pain.  Skin:  Negative for itching and rash.  Neurological:  Negative for dizziness, tingling, seizures and headaches.  Endo/Heme/Allergies:  Negative for polydipsia.  Psychiatric/Behavioral:  Negative for depression. The patient is not nervous/anxious.       Objective:   BP 110/70 (BP Location: Left Arm, Patient Position: Sitting, Cuff Size: Normal)   Pulse 70   Temp (!) 97.2 F (36.2 C) (Temporal)   Ht 5\' 1"  (1.549 m)   Wt 119 lb (54 kg)   LMP 01/01/2024 (Exact Date)   SpO2 99%   BMI 22.48 kg/m  Body mass index is 22.48 kg/m.   General Appearance:    Alert, cooperative, no distress, appears stated age  Head:    Normocephalic, without obvious abnormality, atraumatic  Eyes:    PERRL, conjunctiva/corneas clear, EOM's intact, fundi    benign, both eyes  Ears:    Normal TM's and external ear canals, both ears  Nose:   Nares normal, septum midline, mucosa normal, no drainage    or sinus tenderness  Throat:   Lips, mucosa, and tongue normal; teeth and gums  normal  Neck:   Supple, symmetrical, trachea midline, no adenopathy;    thyroid:  no enlargement/tenderness/nodules; no carotid   bruit or JVD  Back:     Symmetric, no curvature, ROM normal, no CVA tenderness  Lungs:     Clear to auscultation bilaterally, respirations unlabored  Chest Wall:    No tenderness or deformity   Heart:    Regular rate and rhythm, S1 and S2 normal, no murmur, rub or gallop  Breast Exam:    Deferred   Abdomen:     Soft, non-tender, bowel sounds active all four quadrants,    no masses, no organomegaly  Genitalia:    Deferred  Extremities:   Extremities normal, atraumatic, no cyanosis or edema  Pulses:   2+ and symmetric all extremities  Skin:   Skin color, texture, turgor normal, no rashes or lesions  Lymph nodes:   Cervical, supraclavicular, and axillary nodes normal  Neurologic:   CNII-XII intact, normal strength, sensation and reflexes    throughout    Assessment/Plan:   Routine physical examination Today patient counseled on age appropriate routine health concerns for screening and prevention, each reviewed and up to date or declined. Immunizations reviewed and up to date or declined. Labs ordered and reviewed. Risk factors for depression reviewed and negative. Hearing function and visual acuity are intact. ADLs screened and addressed as needed. Functional ability and level of safety reviewed and appropriate. Education, counseling and referrals performed based on assessed risks today. Patient provided with a copy of personalized plan for preventive services.  Hyperlipidemia, unspecified hyperlipidemia type Update lipid panel and provide recommendations  Vitamin D deficiency Update vitamin D and provide recommendations   Acne vulgaris Well controlled Management per derm  Intractable migraine with aura without status migrainosus Well controlled Management per neuro   I, Isabelle Course, acting as a Neurosurgeon for Jarold Motto, [place]., have documented all  relevant documentation on the behalf of Jarold Motto, [place], as directed by  Jarold Motto, PA while in the presence of Jarold Motto, [place].  I, Jarold Motto, [place], have reviewed all documentation for this visit. The documentation on 01/09/24 for the exam, diagnosis, procedures, and orders are all accurate and complete.   Jarold Motto, PA-C [facility]

## 2024-01-10 DIAGNOSIS — Z419 Encounter for procedure for purposes other than remedying health state, unspecified: Secondary | ICD-10-CM | POA: Diagnosis not present

## 2024-01-11 ENCOUNTER — Encounter: Payer: Self-pay | Admitting: Physician Assistant

## 2024-01-15 MED ORDER — ELIDEL 1 % EX CREA
TOPICAL_CREAM | Freq: Two times a day (BID) | CUTANEOUS | 5 refills | Status: AC
Start: 2024-01-15 — End: ?

## 2024-01-15 NOTE — Telephone Encounter (Signed)
 Yes, please try sending to [facility].  Thanks!

## 2024-01-16 DIAGNOSIS — K219 Gastro-esophageal reflux disease without esophagitis: Secondary | ICD-10-CM | POA: Diagnosis not present

## 2024-01-16 DIAGNOSIS — N261 Atrophy of kidney (terminal): Secondary | ICD-10-CM | POA: Diagnosis not present

## 2024-01-16 DIAGNOSIS — G43909 Migraine, unspecified, not intractable, without status migrainosus: Secondary | ICD-10-CM | POA: Diagnosis not present

## 2024-01-16 DIAGNOSIS — I350 Nonrheumatic aortic (valve) stenosis: Secondary | ICD-10-CM | POA: Diagnosis not present

## 2024-01-16 LAB — PROTEIN / CREATININE RATIO, URINE
Albumin, U: <3
Creatinine, Urine: 29.4

## 2024-01-17 LAB — MICROALBUMIN / CREATININE URINE RATIO: Microalb Creat Ratio: <10

## 2024-01-25 ENCOUNTER — Other Ambulatory Visit: Payer: Self-pay | Admitting: Dermatology

## 2024-01-30 ENCOUNTER — Other Ambulatory Visit (HOSPITAL_COMMUNITY): Payer: Self-pay

## 2024-01-30 ENCOUNTER — Telehealth: Payer: Self-pay | Admitting: Pharmacist

## 2024-01-30 ENCOUNTER — Encounter: Payer: Self-pay | Admitting: Physician Assistant

## 2024-01-30 NOTE — Telephone Encounter (Signed)
 Pharmacy Patient Advocate Encounter   Received notification from Patient Pharmacy that prior authorization for Nurtec 75MG  dispersible tablets is required/requested.   Insurance verification completed.   The patient is insured through [facility] [place] .   Per test claim: PA required; PA submitted to above mentioned insurance via CoverMyMeds Key/confirmation #/EOC [id number] Status is pending

## 2024-01-30 NOTE — Telephone Encounter (Signed)
 Pharmacy Patient Advocate Encounter  Received notification from [facility] that Prior Authorization for Nurtec 75MG  dispersible tablets has been DENIED.  Full denial letter will be uploaded to the media tab. See denial reason below.   PA #/Case ID/Reference #: PA Case ID #: [id number]

## 2024-02-02 MED ORDER — RIZATRIPTAN BENZOATE 10 MG PO TABS: 10.0000 mg | ORAL_TABLET | ORAL | 5 refills | Status: DC | PRN

## 2024-02-03 ENCOUNTER — Other Ambulatory Visit (HOSPITAL_COMMUNITY): Payer: Self-pay

## 2024-02-03 ENCOUNTER — Telehealth: Payer: Self-pay

## 2024-02-03 NOTE — Telephone Encounter (Signed)
 Pharmacy Patient Advocate Encounter   Received notification from Pt Calls Messages that prior authorization for Nurtec 75MG  dispersible tablets is required/requested.   Insurance verification completed.   The patient is insured through [facility] .   Per test claim: PA required; PA submitted to above mentioned insurance via CoverMyMeds Key/confirmation #/EOC [id number] Status is pending

## 2024-02-05 ENCOUNTER — Ambulatory Visit: Payer: 59 | Admitting: Obstetrics and Gynecology

## 2024-02-06 NOTE — Telephone Encounter (Signed)
 Sent mychart to pt that Nurtec approved.

## 2024-02-06 NOTE — Addendum Note (Signed)
 Addended by: Arther Abbott on: 02/06/2024 08:20 AM   Modules accepted: Orders

## 2024-02-06 NOTE — Telephone Encounter (Signed)
 Pharmacy Patient Advocate Encounter  Received notification from [facility] that Prior Authorization for Nurtec 75MG  dispersible tablets  has been APPROVED from 02/05/2024 to 02/04/2025   PA #/Case ID/Reference #: [id number]

## 2024-02-07 DIAGNOSIS — Z419 Encounter for procedure for purposes other than remedying health state, unspecified: Secondary | ICD-10-CM | POA: Diagnosis not present

## 2024-02-24 ENCOUNTER — Ambulatory Visit: Payer: 59 | Admitting: Psychiatry

## 2024-02-24 ENCOUNTER — Ambulatory Visit: Payer: 59 | Admitting: Neurology

## 2024-02-24 ENCOUNTER — Encounter: Payer: Self-pay | Admitting: Neurology

## 2024-02-24 ENCOUNTER — Ambulatory Visit: Payer: 59 | Admitting: Diagnostic Neuroimaging

## 2024-02-24 VITALS — BP 107/64 | HR 74 | Ht 61.0 in | Wt 118.0 lb

## 2024-02-24 DIAGNOSIS — Q230 Congenital stenosis of aortic valve: Secondary | ICD-10-CM

## 2024-02-24 DIAGNOSIS — Z954 Presence of other heart-valve replacement: Secondary | ICD-10-CM | POA: Diagnosis not present

## 2024-02-24 DIAGNOSIS — G43109 Migraine with aura, not intractable, without status migrainosus: Secondary | ICD-10-CM | POA: Diagnosis not present

## 2024-02-24 DIAGNOSIS — N261 Atrophy of kidney (terminal): Secondary | ICD-10-CM

## 2024-02-24 DIAGNOSIS — I77810 Thoracic aortic ectasia: Secondary | ICD-10-CM

## 2024-02-24 MED ORDER — NURTEC 75 MG PO TBDP: 75.0000 mg | ORAL_TABLET | ORAL | 5 refills | Status: AC | PRN

## 2024-02-24 NOTE — Progress Notes (Signed)
 [facility]  Provider:  Dr Neylan Koroma Referring Provider: Willow Ora, MD Primary Care Physician:  Jarold Motto, [place]  Chief Complaint  Patient presents with   Migraine    Rm 1 alone Pt is well, reports she has not had any headaches or migraines this year of 2025. Medication does help.    HPI:  Cathy Vasquez is a 33 y.o. female and seen here in a TOC from Dr Delena Bali-  in a Consultation/ Evaluation of Migraine-  which is currently well controlled on Ubrelvy and Nurtec.  She  had surgery at age 38 days as a newborn , dx with aortic stenosis , had a Ross procedure at age 68.    She started having migraine in 4 rth grade. Mostly katamenial, and in both of her pregnancies she had more headaches, more migraines- each time the first trimester was very  hard due to migraines.  IN 2022 she had not one migraine nd was not on medications. In 2013 restarted sporadically-  she had a very different migraine with dysarthria- she was  with her husband at the time, he brought her to the ED,  they found her right carotid artery occluded-   This is related to her early history , cardiac.  Migraine occurrences;  December 30, 2022, check June 30, 2023 September 10, 2023 September 19, 2023 November 21, 2023 and December 06, 2023.  Some with aura, none with left numbness, never with hemiplegia.     Dr Eden Emms : 09-28-2013:  33 y.o. referred by Dr Logan Bores for congenital heart disease. She had aortic stenosis and underwent Ross Procedure in 2004. She had surgery before this at age 38 for atretic valve and right common carotid became occluded   She had two successful pregnancies in 2014, and 2017.  F/U TTE showed moderate MR likely with some MVP. Prior to Luke procedure she had 3 balloon valvuloplasties    TTE 11/15/19 EF 55-60% Normal RV Mild AR, mild-moderate MR estimated PA 37 mmHg mild PR MRA 09/13/19 aortic sinus 4.3 cm , ascending aorta 3.5 cm descending aorta 1.6 cm   TTE 06/26/22 EF 50-55%  mild MR mild AR Aortic root 45 mm mild PR and no PS  MRI 09/05/21 sinus only 3.9 cm normal EF    Noted fatigue with lopressor changed to bisoprolol 2.5 mg Still with fatigue and low BP All beta blocker stopped only to use PRN Discussed salt liberalization   Using Cleosin for SBE   Home schooling 6/10 yo children    Seen by EP Camnitz for palpitations 03/04/23 Only suggestion was to obtain Kardia Monitor and 14 day Zio with 1% PVC burden 04/07/23 .   07-04-2021: Unable to palpate right carotid artery. History of congenital heart disease in aortic valve stenosis. Prior history of right common carotid artery clamping.   EXAM: BILATERAL CAROTID DUPLEX ULTRASOUND   TECHNIQUE: Wallace Cullens scale imaging, color Doppler and duplex ultrasound were performed of bilateral carotid and vertebral arteries in the neck.   COMPARISON:  None.   FINDINGS: Criteria: Quantification of carotid stenosis is based on velocity parameters that correlate the residual internal carotid diameter with NASCET-based stenosis levels, using the diameter of the distal internal carotid lumen as the denominator for stenosis measurement.   The following velocity measurements were obtained:   Right common and internal carotid arteries not identified.   LEFT   ICA: 108/39 cm/sec   CCA: 130/25 cm/sec   SYSTOLIC ICA/CCA RATIO:  0.8   ECA: 139  cm/sec   LEFT CAROTID ARTERY:  No significant atheromatous plaque.   LEFT VERTEBRAL ARTERY:  Antegrade flow.   RIGHT VERTEBRAL ARTERY:  Antegrade flow.   IMPRESSION: 1. No significant abnormality of the left common or internal carotid artery. 2. No right common or internal carotid artery visualized. 3. Bilateral vertebral arteries are patent with antegrade flow.     Electronically Signed   By: Acquanetta Belling M.D.   On: 07/04/2021 15:31    Review of Systems: Out of a complete 14 system review, the patient complains of only the following symptoms, and all other reviewed  systems are negative.   Social History   Socioeconomic History   Marital status: Married    Spouse name: Gerri Spore   Number of children: 2   Years of education: Not on file   Highest education level: Some college, no degree  Occupational History   Occupation: stay at home  Tobacco Use   Smoking status: Never   Smokeless tobacco: Never  Vaping Use   Vaping status: Never Used  Substance and Sexual Activity   Alcohol use: Never   Drug use: Never   Sexual activity: Yes    Partners: Male    Birth control/protection: Other-see comments    Comment: Vasectomy   Other Topics Concern   Not on file  Social History Narrative   Lives with family   Married   Stays at home   Husband is a Education officer, environmental   Two children - boy and girl   Social Drivers of Corporate investment banker Strain: Patient Declined (07/15/2023)   Overall Financial Resource Strain (CARDIA)    Difficulty of Paying Living Expenses: Patient declined  Food Insecurity: Patient Declined (07/15/2023)   Hunger Vital Sign    Worried About Running Out of Food in the Last Year: Patient declined    Ran Out of Food in the Last Year: Patient declined  Transportation Needs: Patient Declined (07/15/2023)   PRAPARE - Administrator, Civil Service (Medical): Patient declined    Lack of Transportation (Non-Medical): Patient declined  Physical Activity: Unknown (07/15/2023)   Exercise Vital Sign    Days of Exercise per Week: Patient declined    Minutes of Exercise per Session: Not on file  Stress: Patient Declined (07/15/2023)   [employer] of Occupational Health - Occupational Stress Questionnaire    Feeling of Stress : Patient declined  Social Connections: Unknown (07/15/2023)   Social Connection and Isolation Panel [NHANES]    Frequency of Communication with Friends and Family: Patient declined    Frequency of Social Gatherings with Friends and Family: Patient declined    Attends Religious Services: Patient declined     Database administrator or Organizations: Patient declined    Attends Engineer, structural: Not on file    Marital Status: Patient declined  Catering manager Violence: Not on file    Family History  Problem Relation Age of Onset   Hypertension Mother    Arthritis Mother        2 knee surgeries   Hypertension Father    Hyperlipidemia Father    Healthy Sister    Healthy Sister    Heart disease Maternal Grandmother    Valvular heart disease Maternal Grandmother    Alcohol abuse Maternal Grandfather    Suicidality Maternal Grandfather    Dementia Paternal Grandmother    COPD Paternal Grandmother    Diabetes Paternal Actor    Healthy Daughter    Healthy  Son    Migraines Maternal Aunt     Past Medical History:  Diagnosis Date   Allergy    Tree nuts   Aortic aneurysm (HCC)    Aortic stenosis    Atopic eczema 10/15/2018   Gastroesophageal reflux disease without esophagitis 12/29/2015   - discussed lifestyle changes - prescribed ranitidine 12/29/15 Rx Zantac 150 mg # 60 BID with refills x 5 given(01/19/16)   Heart disease    Heart murmur    Migraines     Past Surgical History:  Procedure Laterality Date   CARDIAC SURGERY  05/17/2003   Post Ross Procedure   CARDIAC VALVE REPLACEMENT     ROSS [facility] PROCEDURE  2004   VENOUS ABLATION Right 2020   varicose veins/venous insufficiency   WISDOM TOOTH EXTRACTION      Current Outpatient Medications  Medication Sig Dispense Refill   b complex vitamins capsule Take 1 capsule by mouth daily.     Benzoyl Peroxide (PANOXYL WASH EX) Apply topically.     bisoprolol (ZEBETA) 5 MG tablet Take 0.5 tablets (2.5 mg total) by mouth as needed (palpitations). 30 tablet 2   clindamycin (CLEOCIN) 300 MG capsule Take 2 capsules 30-60 minutes before your appointment as needed 10 capsule 1   clindamycin (CLINDAGEL) 1 % gel APPLY TOPICALLY TWICE A DAY 30 g 2   ELIDEL 1 % cream Apply topically 2 (two) times daily. 60 g 5    levocetirizine (XYZAL) 5 MG tablet Take 2.5 mg by mouth as needed for allergies.     NURTEC 75 MG TBDP Take 75 mg by mouth.     tiZANidine (ZANAFLEX) 4 MG tablet Take 0.5-1 tablets (2-4 mg total) by mouth every 8 (eight) hours as needed for muscle spasms. 30 tablet 1   tretinoin (RETIN-A) 0.025 % cream Apply topically at bedtime. Use only on Mon, Wed, Fri 45 g 2   triamcinolone ointment (KENALOG) 0.1 % 1 APP ONCE A DAY AT BEDTIME as needed APPLIED TOPICALLY 80 g 0   Vitamin D, Cholecalciferol, 25 MCG (1000 UT) CAPS Take 1 capsule by mouth daily in the afternoon.     No current facility-administered medications for this visit.    Allergies as of 02/24/2024 - Review Complete 02/24/2024  Allergen Reaction Noted   Other Anaphylaxis 10/11/2016   Amoxicillin Rash 10/30/2011   Cefaclor Hives and Rash 10/30/2011   Penicillin g Rash 10/30/2011    Vitals: BP 107/64   Pulse 74   Ht 5\' 1"  (1.549 m)   Wt 118 lb (53.5 kg)   BMI 22.30 kg/m  Last Weight:  Wt Readings from Last 1 Encounters:  02/24/24 118 lb (53.5 kg)   Last Height:   Ht Readings from Last 1 Encounters:  02/24/24 5\' 1"  (1.549 m)   Last BMI: @LASTBMI  Physical exam:  General: The patient is awake, alert and appears not in acute distress.  The patient is well groomed. Head: Normocephalic, atraumatic.  Neck is supple.   Small scar at the right neck-  no pulse.  No Goiter ?   Neck circumference:14" Cardiovascular:  Regular rate and palpable peripheral pulse:  Respiratory: clear to auscultation.  Mallampati 1, Skin:  Without evidence of edema, or rash Trunk: normal posture.   Neurologic exam : The patient is awake and alert, oriented to place and time.  Memory subjective  described as intact.  There is a normal attention span & concentration ability.  Speech is fluent with dysphonia .  Mood and affect are  appropriate.  Cranial nerves: Pupils are equal and briskly reactive to light. Funduscopic exam without   evidence of pallor or edema. Extraocular movements  in vertical and horizontal planes intact and without nystagmus. Visual fields by finger perimetry are intact. Hearing to finger rub intact.  Facial sensation intact to fine touch. Facial motor strength is symmetric and tongue and uvula move midline.  Motor exam:   Normal tone and normal muscle bulk and symmetric normal strength in all extremities. Grip Strength equal   Sensory:  Fine touch and vibration were tested . Proprioception was tested in the upper extremities only and was  normal.  Coordination: Rapid alternating movements in the fingers/hands were normal.  Finger-to-nose maneuver was tested and showed no evidence of ataxia, dysmetria or tremor.  Gait and station: Patient walked without assistive device , Core Strength within normal limits. Stance is stable and of normal base.   Deep tendon reflexes: in the  upper and lower extremities are symmetric.   Assessment: Total time for face to face interview and examination, for review of  images and laboratory testing, neurophysiology testing and pre-existing records, including out-of -network , was 40 minutes. Assessment is as follows here:  1)   refilled Nurtec, this is her insurances preference = 2) Nephrology  asked her to not use NSAIDS.   Plan:  Treatment plan and additional workup planned after today includes:   1)  yearly RV with NP at [facility]-    Melvyn Novas, MD

## 2024-03-01 ENCOUNTER — Encounter: Payer: Self-pay | Admitting: Physician Assistant

## 2024-03-01 ENCOUNTER — Ambulatory Visit (INDEPENDENT_AMBULATORY_CARE_PROVIDER_SITE_OTHER): Admitting: Physician Assistant

## 2024-03-01 VITALS — BP 120/78 | HR 75 | Temp 97.7°F | Ht 61.0 in | Wt 116.4 lb

## 2024-03-01 DIAGNOSIS — R051 Acute cough: Secondary | ICD-10-CM

## 2024-03-01 MED ORDER — DOXYCYCLINE HYCLATE 100 MG PO TABS: 100.0000 mg | ORAL_TABLET | Freq: Two times a day (BID) | ORAL | 0 refills | Status: DC

## 2024-03-01 NOTE — Progress Notes (Signed)
 Cathy Vasquez is a 33 y.o. female here for a follow up of a pre-existing problem.  History of Present Illness:   Chief Complaint  Patient presents with   Cough    Pt c/o cough x 2 weeks, had low grade temp 99.5 March 12th thru 14th, now has deep cough and expectorating dark yellow sputum, thick yellow nasal drainage.    HPI  Cough: Pt complains of a deep and productive lingering cough, frontal sinus pressure, nasal drainage starting about weeks ago.  She reports known exposure from her kids, they also had URI symptoms.  Reports her ears started clicking yesterday.  Had low grade fevers from March 12-14.  Had a sore throat and a tickle in her throat only first 1-3 days. Has been taking Mucinex DM and Flonase (morning and evening).   Denies any ear pain.  No recent or upcoming travel.   Past Medical History:  Diagnosis Date   Allergy    Tree nuts   Aortic aneurysm (HCC)    Aortic stenosis    Atopic eczema 10/15/2018   Gastroesophageal reflux disease without esophagitis 12/29/2015   - discussed lifestyle changes - prescribed ranitidine 12/29/15 Rx Zantac 150 mg # 60 BID with refills x 5 given(01/19/16)   Heart disease    Heart murmur    Migraines      Social History   Tobacco Use   Smoking status: Never   Smokeless tobacco: Never  Vaping Use   Vaping status: Never Used  Substance Use Topics   Alcohol use: Never   Drug use: Never    Past Surgical History:  Procedure Laterality Date   CARDIAC SURGERY  05/17/2003   Post Ross Procedure   CARDIAC VALVE REPLACEMENT     ROSS [facility] PROCEDURE  2004   VENOUS ABLATION Right 2020   varicose veins/venous insufficiency   WISDOM TOOTH EXTRACTION      Family History  Problem Relation Age of Onset   Hypertension Mother    Arthritis Mother        2 knee surgeries   Hypertension Father    Hyperlipidemia Father    Healthy Sister    Healthy Sister    Heart disease Maternal Grandmother    Valvular heart disease Maternal  Grandmother    Alcohol abuse Maternal Grandfather    Suicidality Maternal Grandfather    Dementia Paternal Grandmother    COPD Paternal Grandmother    Diabetes Paternal Actor    Healthy Daughter    Healthy Son    Migraines Maternal Aunt     Allergies  Allergen Reactions   Other Anaphylaxis    Tree Nuts   Amoxicillin Rash   Cefaclor Hives and Rash   Penicillin G Rash    Current Medications:   Current Outpatient Medications:    b complex vitamins capsule, Take 1 capsule by mouth daily., Disp: , Rfl:    Benzoyl Peroxide (PANOXYL WASH EX), Apply topically., Disp: , Rfl:    bisoprolol (ZEBETA) 5 MG tablet, Take 0.5 tablets (2.5 mg total) by mouth as needed (palpitations)., Disp: 30 tablet, Rfl: 2   clindamycin (CLEOCIN) 300 MG capsule, Take 2 capsules 30-60 minutes before your appointment as needed, Disp: 10 capsule, Rfl: 1   clindamycin (CLINDAGEL) 1 % gel, APPLY TOPICALLY TWICE A DAY, Disp: 30 g, Rfl: 2   doxycycline (VIBRA-TABS) 100 MG tablet, Take 1 tablet (100 mg total) by mouth 2 (two) times daily., Disp: 14 tablet, Rfl: 0   ELIDEL 1 % cream,  Apply topically 2 (two) times daily., Disp: 60 g, Rfl: 5   levocetirizine (XYZAL) 5 MG tablet, Take 2.5 mg by mouth as needed for allergies., Disp: , Rfl:    Rimegepant Sulfate (NURTEC) 75 MG TBDP, Take 1 tablet (75 mg total) by mouth as needed., Disp: 8 tablet, Rfl: 5   tiZANidine (ZANAFLEX) 4 MG tablet, Take 0.5-1 tablets (2-4 mg total) by mouth every 8 (eight) hours as needed for muscle spasms., Disp: 30 tablet, Rfl: 1   tretinoin (RETIN-A) 0.025 % cream, Apply topically at bedtime. Use only on Mon, Wed, Fri, Disp: 45 g, Rfl: 2   triamcinolone ointment (KENALOG) 0.1 %, 1 APP ONCE A DAY AT BEDTIME as needed APPLIED TOPICALLY, Disp: 80 g, Rfl: 0   Vitamin D, Cholecalciferol, 25 MCG (1000 UT) CAPS, Take 2 capsules by mouth daily in the afternoon., Disp: , Rfl:    Review of Systems:   Negative unless otherwise specified per  HPI.  Vitals:   Vitals:   03/01/24 1003  BP: 120/78  Pulse: 75  Temp: 97.7 F (36.5 C)  TempSrc: Temporal  SpO2: 99%  Weight: 116 lb 6.1 oz (52.8 kg)  Height: 5\' 1"  (1.549 m)     Body mass index is 21.99 kg/m.  Physical Exam:   Physical Exam Vitals and nursing note reviewed.  Constitutional:      General: She is not in acute distress.    Appearance: She is well-developed. She is not ill-appearing or toxic-appearing.  HENT:     Head: Normocephalic and atraumatic.     Right Ear: Tympanic membrane, ear canal and external ear normal. Tympanic membrane is not erythematous, retracted or bulging.     Left Ear: Tympanic membrane, ear canal and external ear normal. Tympanic membrane is not erythematous, retracted or bulging.     Nose: Nose normal.     Right Sinus: No maxillary sinus tenderness or frontal sinus tenderness.     Left Sinus: No maxillary sinus tenderness or frontal sinus tenderness.     Mouth/Throat:     Pharynx: Uvula midline. No posterior oropharyngeal erythema.  Eyes:     General: Lids are normal.     Conjunctiva/sclera: Conjunctivae normal.  Neck:     Trachea: Trachea normal.  Cardiovascular:     Rate and Rhythm: Normal rate and regular rhythm.     Heart sounds: Normal heart sounds, S1 normal and S2 normal.  Pulmonary:     Effort: Pulmonary effort is normal.     Breath sounds: Normal breath sounds. No decreased breath sounds, wheezing, rhonchi or rales.  Lymphadenopathy:     Cervical: No cervical adenopathy.  Skin:    General: Skin is warm and dry.  Neurological:     Mental Status: She is alert.  Psychiatric:        Speech: Speech normal.        Behavior: Behavior normal. Behavior is cooperative.     Assessment and Plan:   1. Acute cough (Primary) No red flags on exam.   Will initiate doxycycline per orders.  Discussed taking medications as prescribed.  Reviewed return precautions including new or worsening fever, SOB, new or worsening cough or  other concerns.  Push fluids and rest.  I recommend that patient follow-up if symptoms worsen or persist despite treatment x 7-10 days, sooner if needed.    I, Isabelle Course, acting as a Neurosurgeon for Jarold Motto, [place]., have documented all relevant documentation on the behalf of Jarold Motto, [place], as directed  by  Jarold Motto, PA while in the presence of [place], [place].  I, Jarold Motto, [place], have reviewed all documentation for this visit. The documentation on 03/01/24 for the exam, diagnosis, procedures, and orders are all accurate and complete.  Jarold Motto, PA-C

## 2024-03-04 ENCOUNTER — Ambulatory Visit (INDEPENDENT_AMBULATORY_CARE_PROVIDER_SITE_OTHER): Admitting: Obstetrics and Gynecology

## 2024-03-04 ENCOUNTER — Encounter: Payer: Self-pay | Admitting: Obstetrics and Gynecology

## 2024-03-04 VITALS — BP 120/80 | HR 77 | Wt 117.0 lb

## 2024-03-04 DIAGNOSIS — N83201 Unspecified ovarian cyst, right side: Secondary | ICD-10-CM

## 2024-03-04 NOTE — Progress Notes (Signed)
 Acute Office Visit  Subjective:    Patient ID: Cathy Vasquez, female    DOB: [birth year], 33 y.o.   MRN: [id number]   HPI 33 y.o. presents today for NGYN (NGYN, cyst on rt ovary (CT 11-25-23)//jj/Pt c/o enlarged lymph node under rt arm, pt had workup for this) . Has 65 and 33 year old   Was having pain and CT with likely ovulation cyst. Pain resolved now.  Patient's last menstrual period was 03/01/2024 (exact date). Period Duration (Days): 7 Period Pattern: (!) Irregular Menstrual Flow: Moderate, Light Menstrual Control: Maxi pad, Tampon Dysmenorrhea:  (occ cramping, nausea, diarrhea, migraines) Q27-40 Last about 4-5 days sometimes with light spotting up to 7 First two days heavy Husband with vascetomy Multiple cardiac issues Removal of right carotid artery  Review of Systems    Reproductive: The uterus is present. No adnexal mass is seen. There is a 2.7 cm right ovarian cyst incidentally noted. No follow-up imaging is recommended. Right axillary nodes found by patient Breast US 2024 Targeted ultrasound at the site of palpable concern in the high right axilla reveals an oval circumscribed comma shaped hypoechoic mass just beneath the skin surface measuring 0.4 x 0.2 x 0.6 cm with findings consistent with a small lymph node. Other benign-appearing lymph nodes are identified in the right axilla.   Objective:    OBGyn Exam  Wt 117 lb (53.1 kg)   LMP 03/01/2024 (Exact Date)   BMI 22.11 kg/m  Wt Readings from Last 3 Encounters:  03/04/24 117 lb (53.1 kg)  03/01/24 116 lb 6.1 oz (52.8 kg)  02/24/24 118 lb (53.5 kg)       Past Medical History:  Diagnosis Date   Allergy    Tree nuts   Aortic aneurysm (HCC)    Aortic stenosis    Atopic eczema 10/15/2018   Gastroesophageal reflux disease without esophagitis 12/29/2015   - discussed lifestyle changes - prescribed ranitidine 12/29/15 Rx Zantac 150 mg # 60 BID with refills x 5 given(01/19/16)   Heart disease    Heart  murmur    Migraines    Past Surgical History:  Procedure Laterality Date   CARDIAC SURGERY  05/17/2003   Post Ross Procedure   CARDIAC VALVE REPLACEMENT     ROSS [facility] PROCEDURE  2004   VENOUS ABLATION Right 2020   varicose veins/venous insufficiency   WISDOM TOOTH EXTRACTION     Current Outpatient Medications on File Prior to Visit  Medication Sig Dispense Refill   b complex vitamins capsule Take 1 capsule by mouth daily.     Benzoyl Peroxide (PANOXYL WASH EX) Apply topically.     cetirizine (ZYRTEC) 10 MG tablet Take 10 mg by mouth daily.     clindamycin (CLINDAGEL) 1 % gel APPLY TOPICALLY TWICE A DAY 30 g 2   doxycycline (VIBRA-TABS) 100 MG tablet Take 1 tablet (100 mg total) by mouth 2 (two) times daily. 14 tablet 0   ELIDEL 1 % cream Apply topically 2 (two) times daily. 60 g 5   fluticasone (FLONASE) 50 MCG/ACT nasal spray Place into both nostrils daily.     Rimegepant Sulfate (NURTEC) 75 MG TBDP Take 1 tablet (75 mg total) by mouth as needed. 8 tablet 5   tiZANidine (ZANAFLEX) 4 MG tablet Take 0.5-1 tablets (2-4 mg total) by mouth every 8 (eight) hours as needed for muscle spasms. 30 tablet 1   tretinoin (RETIN-A) 0.025 % cream Apply topically at bedtime. Use only on Mon, Wed, Fri 45 g  2   triamcinolone ointment (KENALOG) 0.1 % 1 APP ONCE A DAY AT BEDTIME as needed APPLIED TOPICALLY 80 g 0   Vitamin D, Cholecalciferol, 25 MCG (1000 UT) CAPS Take 2 capsules by mouth daily in the afternoon.     bisoprolol (ZEBETA) 5 MG tablet Take 0.5 tablets (2.5 mg total) by mouth as needed (palpitations). (Patient not taking: Reported on 03/04/2024) 30 tablet 2   clindamycin (CLEOCIN) 300 MG capsule Take 2 capsules 30-60 minutes before your appointment as needed (Patient not taking: Reported on 03/04/2024) 10 capsule 1   No current facility-administered medications on file prior to visit.   Social History   Socioeconomic History   Marital status: Married    Spouse name: Gerri Spore   Number of  children: 2   Years of education: Not on file   Highest education level: Some college, no degree  Occupational History   Occupation: stay at home  Tobacco Use   Smoking status: Never   Smokeless tobacco: Never  Vaping Use   Vaping status: Never Used  Substance and Sexual Activity   Alcohol use: Never   Drug use: Never   Sexual activity: Yes    Partners: Male    Birth control/protection: Other-see comments    Comment: Vasectomy   Other Topics Concern   Not on file  Social History Narrative   Lives with family   Married   Stays at home   Husband is a Education officer, environmental   Two children - boy and girl   Social Drivers of Corporate investment banker Strain: Patient Declined (07/15/2023)   Overall Financial Resource Strain (CARDIA)    Difficulty of Paying Living Expenses: Patient declined  Food Insecurity: Patient Declined (07/15/2023)   Hunger Vital Sign    Worried About Running Out of Food in the Last Year: Patient declined    Ran Out of Food in the Last Year: Patient declined  Transportation Needs: Patient Declined (07/15/2023)   PRAPARE - Administrator, Civil Service (Medical): Patient declined    Lack of Transportation (Non-Medical): Patient declined  Physical Activity: Unknown (07/15/2023)   Exercise Vital Sign    Days of Exercise per Week: Patient declined    Minutes of Exercise per Session: Not on file  Stress: Patient Declined (07/15/2023)   [employer] of Occupational Health - Occupational Stress Questionnaire    Feeling of Stress : Patient declined  Social Connections: Unknown (07/15/2023)   Social Connection and Isolation Panel [NHANES]    Frequency of Communication with Friends and Family: Patient declined    Frequency of Social Gatherings with Friends and Family: Patient declined    Attends Religious Services: Patient declined    Database administrator or Organizations: Patient declined    Attends Banker Meetings: Not on file    Marital Status:  Patient declined     Patient informed chaperone available to be present for breast and/or pelvic exam. Patient has requested no chaperone to be present. Patient has been advised what will be completed during breast and pelvic exam.   Assessment & Plan: Right ovarian cyst, pelvic pain now resolved to establish care  RTC to schedule annual exam and have PUS to evaluate for the cysts.  Discussed most likely ovulation cyst and likely resolution by next the ultrasound. Has referral to nephrology for right renal atrophy  15 minutes spent on reviewing records, imaging,  and one on one patient time and counseling patient and documentation Dr. Karma Greaser  Earley Favor

## 2024-03-08 ENCOUNTER — Other Ambulatory Visit: Payer: Self-pay | Admitting: Cardiovascular Disease

## 2024-03-08 DIAGNOSIS — R002 Palpitations: Secondary | ICD-10-CM

## 2024-03-08 DIAGNOSIS — R9439 Abnormal result of other cardiovascular function study: Secondary | ICD-10-CM

## 2024-03-08 DIAGNOSIS — I340 Nonrheumatic mitral (valve) insufficiency: Secondary | ICD-10-CM

## 2024-03-08 DIAGNOSIS — Z954 Presence of other heart-valve replacement: Secondary | ICD-10-CM

## 2024-03-09 ENCOUNTER — Ambulatory Visit: Payer: 59 | Admitting: Obstetrics and Gynecology

## 2024-03-20 DIAGNOSIS — Z419 Encounter for procedure for purposes other than remedying health state, unspecified: Secondary | ICD-10-CM | POA: Diagnosis not present

## 2024-04-08 ENCOUNTER — Encounter: Payer: Self-pay | Admitting: Obstetrics and Gynecology

## 2024-04-08 ENCOUNTER — Ambulatory Visit (INDEPENDENT_AMBULATORY_CARE_PROVIDER_SITE_OTHER): Admitting: Obstetrics and Gynecology

## 2024-04-08 ENCOUNTER — Ambulatory Visit

## 2024-04-08 VITALS — BP 110/70 | HR 75

## 2024-04-08 DIAGNOSIS — N83201 Unspecified ovarian cyst, right side: Secondary | ICD-10-CM

## 2024-04-08 DIAGNOSIS — Z712 Person consulting for explanation of examination or test findings: Secondary | ICD-10-CM

## 2024-04-08 NOTE — Progress Notes (Signed)
 Acute Office Visit  Subjective:    Patient ID: Cathy Vasquez, female    DOB: [birth year], 33 y.o.   MRN: [id number]   HPI 33 y.o. presents today for uls & consult (U/s & consult//jj) . Has 33 and 33 year old.    Was having pain and CT with likely ovulation cyst. Pain resolved now.  Patient's last menstrual period was 04/01/2024 (exact date). Period Pattern: Regular Q27-40 Last two cycles Q32 days. Had diarrhea before cycle and right axillary pain.  Did report cramping like in her teenage years  Last about 4-5 days sometimes with light spotting up to 7 First two days heavy Husband with vascetomy Multiple cardiac issues Removal of right carotid artery  Review of Systems    Reproductive: The uterus is present. No adnexal mass is seen. There is a 2.7 cm right ovarian cyst incidentally noted. No follow-up imaging is recommended. Right axillary nodes found by patient Breast US  2024 Targeted ultrasound at the site of palpable concern in the high right axilla reveals an oval circumscribed comma shaped hypoechoic mass just beneath the skin surface measuring 0.4 x 0.2 x 0.6 cm with findings consistent with a small lymph node. Other benign-appearing lymph nodes are identified in the right axilla.    PUS today with  7.07cm uterus No masses, normal size Normal ovaries (cyst resolved) Thin EML  Objective:    OBGyn Exam  BP 110/70   Pulse 75   LMP 04/01/2024 (Exact Date)   SpO2 99%  Wt Readings from Last 3 Encounters:  03/04/24 117 lb (53.1 kg)  03/01/24 116 lb 6.1 oz (52.8 kg)  02/24/24 118 lb (53.5 kg)       Past Medical History:  Diagnosis Date   Allergy    Tree nuts   Aortic aneurysm (HCC)    Aortic stenosis    Atopic eczema 10/15/2018   Gastroesophageal reflux disease without esophagitis 12/29/2015   - discussed lifestyle changes - prescribed ranitidine 12/29/15 Rx Zantac 150 mg # 60 BID with refills x 5 given(01/19/16)   Heart disease    Heart murmur     Migraines    Past Surgical History:  Procedure Laterality Date   CARDIAC SURGERY  05/17/2003   Post [facility] Procedure   CARDIAC VALVE REPLACEMENT     ROSS [facility] PROCEDURE  2004   VENOUS ABLATION Right 2020   varicose veins/venous insufficiency   WISDOM TOOTH EXTRACTION     Current Outpatient Medications on File Prior to Visit  Medication Sig Dispense Refill   b complex vitamins capsule Take 1 capsule by mouth daily.     Benzoyl Peroxide (PANOXYL WASH EX) Apply topically.     cetirizine (ZYRTEC) 10 MG tablet Take 10 mg by mouth daily.     clindamycin  (CLINDAGEL) 1 % gel APPLY TOPICALLY TWICE A DAY 30 g 2   ELIDEL  1 % cream Apply topically 2 (two) times daily. 60 g 5   fluticasone (FLONASE) 50 MCG/ACT nasal spray Place into both nostrils as needed.     Rimegepant Sulfate (NURTEC) 75 MG TBDP Take 1 tablet (75 mg total) by mouth as needed. 8 tablet 5   tretinoin  (RETIN-A ) 0.025 % cream Apply topically at bedtime. Use only on Mon, Wed, Fri 45 g 2   triamcinolone  ointment (KENALOG ) 0.1 % 1 APP ONCE A DAY AT BEDTIME as needed APPLIED TOPICALLY 80 g 0   Vitamin D , Cholecalciferol, 25 MCG (1000 UT) CAPS Take 2 capsules by mouth daily in the afternoon.  bisoprolol  (ZEBETA ) 5 MG tablet Take 0.5 tablets (2.5 mg total) by mouth as needed (palpitations). (Patient not taking: Reported on 04/08/2024) 30 tablet 2   clindamycin  (CLEOCIN ) 300 MG capsule Take 2 capsules 30-60 minutes before your appointment as needed (Patient not taking: Reported on 04/08/2024) 10 capsule 1   tiZANidine  (ZANAFLEX ) 4 MG tablet Take 0.5-1 tablets (2-4 mg total) by mouth every 8 (eight) hours as needed for muscle spasms. (Patient not taking: Reported on 04/08/2024) 30 tablet 1   No current facility-administered medications on file prior to visit.   Social History   Socioeconomic History   Marital status: Married    Spouse name: Melodee Spruce   Number of children: 2   Years of education: Not on file   Highest education level: Some  college, no degree  Occupational History   Occupation: stay at home  Tobacco Use   Smoking status: Never   Smokeless tobacco: Never  Vaping Use   Vaping status: Never Used  Substance and Sexual Activity   Alcohol use: Never   Drug use: Never   Sexual activity: Yes    Partners: Male    Birth control/protection: Other-see comments    Comment: Vasectomy   Other Topics Concern   Not on file  Social History Narrative   Lives with family   Married   Stays at home   Husband is a Education officer, environmental   Two children - boy and girl   Social Drivers of Corporate investment banker Strain: Patient Declined (07/15/2023)   Overall Financial Resource Strain (CARDIA)    Difficulty of Paying Living Expenses: Patient declined  Food Insecurity: Patient Declined (07/15/2023)   Hunger Vital Sign    Worried About Running Out of Food in the Last Year: Patient declined    Ran Out of Food in the Last Year: Patient declined  Transportation Needs: Patient Declined (07/15/2023)   PRAPARE - Administrator, Civil Service (Medical): Patient declined    Lack of Transportation (Non-Medical): Patient declined  Physical Activity: Unknown (07/15/2023)   Exercise Vital Sign    Days of Exercise per Week: Patient declined    Minutes of Exercise per Session: Not on file  Stress: Patient Declined (07/15/2023)   [employer] of Occupational Health - Occupational Stress Questionnaire    Feeling of Stress : Patient declined  Social Connections: Unknown (07/15/2023)   Social Connection and Isolation Panel [NHANES]    Frequency of Communication with Friends and Family: Patient declined    Frequency of Social Gatherings with Friends and Family: Patient declined    Attends Religious Services: Patient declined    Database administrator or Organizations: Patient declined    Attends Banker Meetings: Not on file    Marital Status: Patient declined     Patient informed chaperone available to be present for  breast and/or pelvic exam. Patient has requested no chaperone to be present. Patient has been advised what will be completed during breast and pelvic exam.   Assessment & Plan: Right ovarian cyst, pelvic pain now resolved presenting for PUS results     Counseled on PUS results. Ovarian cyst resolved. She is returning for annual exam next week. 10 minutes spent on reviewing records, imaging,  and one on one patient time and counseling patient and documentation Dr. Caro Christmas

## 2024-04-13 ENCOUNTER — Other Ambulatory Visit (HOSPITAL_COMMUNITY)
Admission: RE | Admit: 2024-04-13 | Discharge: 2024-04-13 | Disposition: A | Discharge status: Home / Self Care | Source: Ambulatory Visit | Attending: Obstetrics and Gynecology | Admitting: Obstetrics and Gynecology

## 2024-04-13 ENCOUNTER — Encounter: Payer: Self-pay | Admitting: Obstetrics and Gynecology

## 2024-04-13 ENCOUNTER — Ambulatory Visit: Admitting: Obstetrics and Gynecology

## 2024-04-13 VITALS — BP 120/80 | HR 79 | Ht 61.25 in | Wt 116.0 lb

## 2024-04-13 DIAGNOSIS — Z1331 Encounter for screening for depression: Secondary | ICD-10-CM

## 2024-04-13 DIAGNOSIS — Z01419 Encounter for gynecological examination (general) (routine) without abnormal findings: Secondary | ICD-10-CM

## 2024-04-13 NOTE — Progress Notes (Signed)
 33 y.o. y.o. female here for annual exam. Patient's last menstrual period was 04/01/2024 (exact date).   Husband is Education officer, environmental. Has 51 and 33 year old. 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  Body mass index is 21.74 kg/m.     04/13/2024    3:03 PM 01/09/2024    9:06 AM 11/24/2023    3:07 PM  Depression screen PHQ 2/9  Decreased Interest 0 0 0  Down, Depressed, Hopeless 0 0 0  PHQ - 2 Score 0 0 0  Altered sleeping  0 0  Tired, decreased energy  0 0  Change in appetite  0 0  Feeling bad or failure about yourself   0 0  Trouble concentrating  0 0  Moving slowly or fidgety/restless  0 0  Suicidal thoughts  0 0  PHQ-9 Score  0 0    Blood pressure 120/80, pulse 79, height 5' 1.25" (1.556 m), weight 116 lb (52.6 kg), last menstrual period 04/01/2024, SpO2 98%.     Component Value Date/Time   DIAGPAP  11/14/2020 1108    - Negative for intraepithelial lesion or malignancy (NILM)   ADEQPAP  11/14/2020 1108    Satisfactory for evaluation; transformation zone component PRESENT.    GYN HISTORY:    Component Value Date/Time   DIAGPAP  11/14/2020 1108    - Negative for intraepithelial lesion or malignancy (NILM)   ADEQPAP  11/14/2020 1108    Satisfactory for evaluation; transformation zone component PRESENT.    OB History  Gravida Para Term Preterm AB Living  2 2 2   2   SAB IAB Ectopic Multiple Live Births      2    # Outcome Date GA Lbr Len/2nd Weight Sex Type Anes PTL Lv  2 Term           1 Term             Past Medical History:  Diagnosis Date   Allergy    Tree nuts   Aortic aneurysm (HCC)    Aortic stenosis    Atopic eczema 10/15/2018   Gastroesophageal reflux disease without esophagitis 12/29/2015   - discussed lifestyle changes - prescribed ranitidine 12/29/15 Rx Zantac 150 mg # 60 BID with refills x 5  given(01/19/16)   Heart disease    Heart murmur    Migraines     Past Surgical History:  Procedure Laterality Date   CARDIAC SURGERY  05/17/2003   Post [facility] Procedure   CARDIAC VALVE REPLACEMENT     ROSS [facility] PROCEDURE  2004   VENOUS ABLATION Right 2020   varicose veins/venous insufficiency   WISDOM TOOTH EXTRACTION      Current Outpatient Medications on File Prior to Visit  Medication Sig Dispense Refill   b complex vitamins capsule Take 1 capsule by mouth daily.     Benzoyl Peroxide (PANOXYL WASH EX) Apply topically.     cetirizine (ZYRTEC) 10 MG tablet Take 10 mg by mouth daily.     clindamycin  (CLINDAGEL) 1 % gel APPLY TOPICALLY TWICE A DAY 30 g 2   ELIDEL  1 % cream Apply topically 2 (two) times daily. 60 g 5   fluticasone (FLONASE) 50 MCG/ACT nasal spray Place into both nostrils as needed.     Rimegepant Sulfate (NURTEC) 75 MG TBDP Take 1 tablet (75  mg total) by mouth as needed. 8 tablet 5   tiZANidine  (ZANAFLEX ) 4 MG tablet Take 0.5-1 tablets (2-4 mg total) by mouth every 8 (eight) hours as needed for muscle spasms. 30 tablet 1   tretinoin  (RETIN-A ) 0.025 % cream Apply topically at bedtime. Use only on Mon, Wed, Fri 45 g 2   triamcinolone  ointment (KENALOG ) 0.1 % 1 APP ONCE A DAY AT BEDTIME as needed APPLIED TOPICALLY 80 g 0   Vitamin D , Cholecalciferol, 25 MCG (1000 UT) CAPS Take 2 capsules by mouth daily in the afternoon.     bisoprolol  (ZEBETA ) 5 MG tablet Take 0.5 tablets (2.5 mg total) by mouth as needed (palpitations). (Patient not taking: Reported on 03/04/2024) 30 tablet 2   clindamycin  (CLEOCIN ) 300 MG capsule Take 2 capsules 30-60 minutes before your appointment as needed (Patient not taking: Reported on 03/04/2024) 10 capsule 1   No current facility-administered medications on file prior to visit.    Social History   Socioeconomic History   Marital status: Married    Spouse name: Melodee Spruce   Number of children: 2   Years of education: Not on file   Highest  education level: Some college, no degree  Occupational History   Occupation: stay at home  Tobacco Use   Smoking status: Never   Smokeless tobacco: Never  Vaping Use   Vaping status: Never Used  Substance and Sexual Activity   Alcohol use: Never   Drug use: Never   Sexual activity: Yes    Partners: Male    Birth control/protection: Other-see comments    Comment: Vasectomy   Other Topics Concern   Not on file  Social History Narrative   Lives with family   Married   Stays at home   Husband is a Education officer, environmental   Two children - boy and girl   Social Drivers of Corporate investment banker Strain: Patient Declined (07/15/2023)   Overall Financial Resource Strain (CARDIA)    Difficulty of Paying Living Expenses: Patient declined  Food Insecurity: Patient Declined (07/15/2023)   Hunger Vital Sign    Worried About Running Out of Food in the Last Year: Patient declined    Ran Out of Food in the Last Year: Patient declined  Transportation Needs: Patient Declined (07/15/2023)   PRAPARE - Administrator, Civil Service (Medical): Patient declined    Lack of Transportation (Non-Medical): Patient declined  Physical Activity: Unknown (07/15/2023)   Exercise Vital Sign    Days of Exercise per Week: Patient declined    Minutes of Exercise per Session: Not on file  Stress: Patient Declined (07/15/2023)   [employer] of Occupational Health - Occupational Stress Questionnaire    Feeling of Stress : Patient declined  Social Connections: Unknown (07/15/2023)   Social Connection and Isolation Panel [NHANES]    Frequency of Communication with Friends and Family: Patient declined    Frequency of Social Gatherings with Friends and Family: Patient declined    Attends Religious Services: Patient declined    Database administrator or Organizations: Patient declined    Attends Engineer, structural: Not on file    Marital Status: Patient declined  Catering manager Violence: Not on file     Family History  Problem Relation Age of Onset   Hypertension Mother    Arthritis Mother        2 knee surgeries   Hypertension Father    Hyperlipidemia Father    Healthy Sister  Healthy Sister    Heart disease Maternal Grandmother    Valvular heart disease Maternal Grandmother    Alcohol abuse Maternal Grandfather    Suicidality Maternal Grandfather    Dementia Paternal Grandmother    COPD Paternal Grandmother    Diabetes Paternal Grandfather    Healthy Daughter    Healthy Son    Migraines Maternal Aunt      Allergies  Allergen Reactions   Other Anaphylaxis    Tree Nuts   Amoxicillin Rash   Cefaclor Hives and Rash   Penicillin G Rash      Patient's last menstrual period was Patient's last menstrual period was 04/01/2024 (exact date)..            Review of Systems Alls systems reviewed and are negative.     Physical Exam Constitutional:      Appearance: Normal appearance.  Genitourinary:     Vulva and urethral meatus normal.     No lesions in the vagina.     Right Labia: No rash, lesions or skin changes.    Left Labia: No lesions, skin changes or rash.    No vaginal discharge or tenderness.     No vaginal prolapse present.    No vaginal atrophy present.     Right Adnexa: not tender, not palpable and no mass present.    Left Adnexa: not tender, not palpable and no mass present.    No cervical motion tenderness or discharge.     Uterus is not enlarged, tender or irregular.  Breasts:    Right: Normal.     Left: Normal.  HENT:     Head: Normocephalic.  Neck:     Thyroid : No thyroid  mass, thyromegaly or thyroid  tenderness.  Cardiovascular:     Rate and Rhythm: Normal rate and regular rhythm.     Heart sounds: Normal heart sounds, S1 normal and S2 normal.  Pulmonary:     Effort: Pulmonary effort is normal.     Breath sounds: Normal breath sounds and air entry.  Abdominal:     General: There is no distension.     Palpations: Abdomen is soft.  There is no mass.     Tenderness: There is no abdominal tenderness. There is no guarding or rebound.  Musculoskeletal:        General: Normal range of motion.     Cervical back: Full passive range of motion without pain, normal range of motion and neck supple. No tenderness.     Right lower leg: No edema.     Left lower leg: No edema.  Neurological:     Mental Status: She is alert.  Skin:    General: Skin is warm.  Psychiatric:        Mood and Affect: Mood normal.        Behavior: Behavior normal.        Thought Content: Thought content normal.  Vitals and nursing note reviewed. Exam conducted with a chaperone present.       A:         Well Woman GYN exam                             P:        Pap smear collected today Encouraged annual mammogram screening Colon cancer screening not indicated Labs and immunizations to do with PMD Discussed breast self exams Encouraged healthy lifestyle practices Encouraged Vit D and Calcium  No follow-ups on file.  Reinaldo Caras

## 2024-04-15 ENCOUNTER — Encounter: Payer: Self-pay | Admitting: Obstetrics and Gynecology

## 2024-04-15 LAB — CYTOLOGY - PAP: Diagnosis: NEGATIVE

## 2024-04-15 NOTE — Addendum Note (Signed)
 Addended by: Reinaldo Caras on: 04/15/2024 01:46 PM   Modules accepted: Level of Service

## 2024-04-19 DIAGNOSIS — Z419 Encounter for procedure for purposes other than remedying health state, unspecified: Secondary | ICD-10-CM | POA: Diagnosis not present

## 2024-05-17 ENCOUNTER — Encounter: Payer: Self-pay | Admitting: Physician Assistant

## 2024-05-17 MED ORDER — EPINEPHRINE 0.3 MG/0.3ML IJ SOAJ: 0.3000 mg | INTRAMUSCULAR | 2 refills | Status: AC | PRN

## 2024-05-20 DIAGNOSIS — Z419 Encounter for procedure for purposes other than remedying health state, unspecified: Secondary | ICD-10-CM | POA: Diagnosis not present

## 2024-05-24 ENCOUNTER — Encounter: Payer: Self-pay | Admitting: Internal Medicine

## 2024-05-24 ENCOUNTER — Ambulatory Visit: Admitting: Internal Medicine

## 2024-05-24 ENCOUNTER — Ambulatory Visit: Payer: Self-pay

## 2024-05-24 VITALS — BP 120/66 | HR 76 | Temp 98.7°F | Ht 61.25 in | Wt 117.8 lb

## 2024-05-24 DIAGNOSIS — E349 Endocrine disorder, unspecified: Secondary | ICD-10-CM | POA: Insufficient documentation

## 2024-05-24 DIAGNOSIS — R319 Hematuria, unspecified: Secondary | ICD-10-CM | POA: Diagnosis not present

## 2024-05-24 DIAGNOSIS — R350 Frequency of micturition: Secondary | ICD-10-CM | POA: Diagnosis not present

## 2024-05-24 DIAGNOSIS — N261 Atrophy of kidney (terminal): Secondary | ICD-10-CM

## 2024-05-24 DIAGNOSIS — N342 Other urethritis: Secondary | ICD-10-CM

## 2024-05-24 LAB — POC URINALSYSI DIPSTICK (AUTOMATED)
Bilirubin, UA: NEGATIVE
Glucose, UA: NEGATIVE
Ketones, UA: NEGATIVE
Nitrite, UA: NEGATIVE
Protein, UA: NEGATIVE
Spec Grav, UA: 1.01 (ref 1.010–1.025)
Urobilinogen, UA: 0.2 U/dL
pH, UA: 6 (ref 5.0–8.0)

## 2024-05-24 MED ORDER — SULFAMETHOXAZOLE-TRIMETHOPRIM 800-160 MG PO TABS: 1.0000 | ORAL_TABLET | Freq: Two times a day (BID) | ORAL | 0 refills | Status: AC

## 2024-05-24 NOTE — Progress Notes (Signed)
 ==============================  [facility]: [phone]   -- Medical Office Visit --  Patient: Cathy Vasquez      Age: 33 y.o.       Sex:  female  Date:   05/24/2024 Today's Healthcare Provider: Anthon Kins, MD  ==============================   Chief Complaint: Hematuria (Has had a lot of going to bathroom a lot and when she goes its not a lot. Blood in urine pink no fevers and pressure and discomfort.)  Discussed the use of AI scribe software for clinical note transcription with the patient, who gave verbal consent to proceed.  History of Present Illness 33 year old female with right kidney atrophy who presents with urinary frequency and hematuria.  She began experiencing urinary frequency and hematuria on the morning of May 23, 2024, with pink discoloration upon wiping after urination. Initially thought to be menstrual spotting, she realized it was unlikely as her last menstrual period was on May 04, 2024, and she typically has a longer cycle. No pain or burning during urination, but she describes a 'weird sensation' at the end of urination, localized to the urethral orifice.  She has a history of right kidney atrophy, identified on a CT scan, with the right kidney being smaller and the left kidney compensating by being larger. She denies any history of diabetes, with normal A1c levels in the past. 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  She is allergic to penicillin, amoxicillin, and Cipro, and has previously been prescribed doxycycline  and Z-Pak for infections. She reports a history of low blood pressure, which has been symptomatic recently, causing dizziness and fatigue.  Married for almost fourteen years, she is in a faithful relationship. No symptoms suggestive of a sexually transmitted infection and she does not wish to be tested for  such. She mentions having lymph nodes in her armpits that become sore before her menstrual cycle.  Hematuria This is a new problem. The current episode started yesterday. The problem is unchanged. She describes the hematuria as gross hematuria. The hematuria occurs throughout her entire urinary stream.    Background Reviewed: Problem List: has Ascending aorta dilatation (HCC); Congenital aortic stenosis; H/O Ross procedure; Non-rheumatic mitral regurgitation; Varicose veins of right leg with edema; Venous insufficiency (chronic) (peripheral); Atopic eczema; Dyshidrotic eczema; Migraine with aura; Migraine, ophthalmoplegic; MVP (mitral valve prolapse); Positive D dimer; Carotid occlusion, right; SBE (subacute bacterial endocarditis) prophylaxis candidate; Notalgia paresthetica; DUB (dysfunctional uterine bleeding); and Atrophy of right kidney on their problem list. Past Medical History:  has a past medical history of Allergy, Aortic aneurysm (HCC), Aortic stenosis, Atopic eczema (10/15/2018), Gastroesophageal reflux disease without esophagitis (12/29/2015), Heart disease, Heart murmur, and Migraines. Past Surgical History:   has a past surgical history that includes Cardiac surgery (05/17/2003); Wisdom tooth extraction; Alphia Asa procedure (2004); Venous ablation (Right, 2020); and Cardiac valve replacement. Social History:   reports that she has never smoked. She has never used smokeless tobacco. She reports that she does not drink alcohol and does not use drugs. Family History:  family history includes Alcohol abuse in her maternal grandfather; Arthritis in her mother; COPD in her paternal grandmother; Dementia in her paternal grandmother; Diabetes in her paternal grandfather; Healthy in her daughter, sister, sister, and son; Heart disease in her maternal grandmother; Hyperlipidemia in her father; Hypertension in her father and mother; Migraines in her maternal aunt; Suicidality  in her maternal  grandfather; Valvular heart disease in her maternal grandmother. Allergies:  is allergic to other, amoxicillin, cefaclor, and penicillin g.   Medication Reconciliation: Current Outpatient Medications on File Prior to Visit  Medication Sig   b complex vitamins capsule Take 1 capsule by mouth daily.   Benzoyl Peroxide (PANOXYL WASH EX) Apply topically.   cetirizine (ZYRTEC) 10 MG tablet Take 10 mg by mouth daily.   clindamycin  (CLINDAGEL) 1 % gel APPLY TOPICALLY TWICE A DAY   ELIDEL  1 % cream Apply topically 2 (two) times daily.   EPINEPHrine  0.3 mg/0.3 mL IJ SOAJ injection Inject 0.3 mg into the muscle as needed for anaphylaxis.   fluticasone (FLONASE) 50 MCG/ACT nasal spray Place into both nostrils as needed.   Rimegepant Sulfate (NURTEC) 75 MG TBDP Take 1 tablet (75 mg total) by mouth as needed.   tiZANidine  (ZANAFLEX ) 4 MG tablet Take 0.5-1 tablets (2-4 mg total) by mouth every 8 (eight) hours as needed for muscle spasms.   tretinoin  (RETIN-A ) 0.025 % cream Apply topically at bedtime. Use only on Mon, Wed, Fri   triamcinolone  ointment (KENALOG ) 0.1 % 1 APP ONCE A DAY AT BEDTIME as needed APPLIED TOPICALLY   Vitamin D , Cholecalciferol, 25 MCG (1000 UT) CAPS Take 2 capsules by mouth daily in the afternoon.   bisoprolol  (ZEBETA ) 5 MG tablet Take 0.5 tablets (2.5 mg total) by mouth as needed (palpitations). (Patient not taking: Reported on 05/24/2024)   clindamycin  (CLEOCIN ) 300 MG capsule Take 2 capsules 30-60 minutes before your appointment as needed (Patient not taking: Reported on 03/04/2024)   No current facility-administered medications on file prior to visit.  There are no discontinued medications.   Physical Exam:    05/24/2024   11:10 AM 04/13/2024    3:00 PM 04/08/2024    1:51 PM  Vitals with BMI  Height 5' 1.25 5' 1.25   Weight 117 lbs 13 oz 116 lbs   BMI 22.07 21.73   Systolic 120 120 742  Diastolic 66 80 70  Pulse 76 79 75  Vital signs reviewed.  Nursing notes reviewed.  Weight trend reviewed. Physical Exam General Appearance:  No acute distress appreciable.   Well-groomed, healthy-appearing female.  Well proportioned with no abnormal fat distribution.  Good muscle tone. Pulmonary:  Normal work of breathing at rest, no respiratory distress apparent. SpO2: 98 %  Musculoskeletal: All extremities are intact.  Neurological:  Awake, alert, oriented, and engaged.  No obvious focal neurological deficits or cognitive impairments.  Sensorium seems unclouded.   Speech is clear and coherent with logical content. Psychiatric:  Appropriate mood, pleasant and cooperative demeanor, thoughtful and engaged during the exam Physical Exam No CVA tenderness or bladder tenderness.      Results for orders placed or performed in visit on 05/24/24  POCT Urinalysis Dipstick (Automated)  Result Value Ref Range   Color, UA yellow    Clarity, UA cloudy    Glucose, UA Negative Negative   Bilirubin, UA negative    Ketones, UA negative    Spec Grav, UA 1.010 1.010 - 1.025   Blood, UA +-10    pH, UA 6.0 5.0 - 8.0   Protein, UA Negative Negative   Urobilinogen, UA 0.2 0.2 or 1.0 E.U./dL   Nitrite, UA negative    Leukocytes, UA Trace (A) Negative   Office Visit on 05/24/2024  Component Date Value Ref Range Status   Color, UA 05/24/2024 yellow   Final   Clarity, UA 05/24/2024 cloudy  Final   Glucose, UA 05/24/2024 Negative  Negative Final   Bilirubin, UA 05/24/2024 negative   Final   Ketones, UA 05/24/2024 negative   Final   Spec Grav, UA 05/24/2024 1.010  1.010 - 1.025 Final   Blood, UA 05/24/2024 +-10   Final   pH, UA 05/24/2024 6.0  5.0 - 8.0 Final   Protein, UA 05/24/2024 Negative  Negative Final   Urobilinogen, UA 05/24/2024 0.2  0.2 or 1.0 E.U./dL Final   Nitrite, UA 16/09/9603 negative   Final   Leukocytes, UA 05/24/2024 Trace (A)  Negative Final  Office Visit on 04/13/2024  Component Date Value Ref Range Status   Adequacy 04/13/2024 Satisfactory for evaluation;  transformation zone component PRESENT.   Final   Diagnosis 04/13/2024 - Negative for intraepithelial lesion or malignancy (NILM)   Final  Abstract on 01/30/2024  Component Date Value Ref Range Status   Microalb Creat Ratio 01/16/2024 <10   Final   Creatinine, Urine 01/16/2024 29.4   Final   Albumin, U 01/16/2024 <3.0   Final  Office Visit on 01/09/2024  Component Date Value Ref Range Status   Cholesterol 01/09/2024 188  0 - 200 mg/dL Final   Triglycerides 54/08/8118 112.0  0.0 - 149.0 mg/dL Final   HDL 14/78/2956 56.20  >39.00 mg/dL Final   VLDL 21/30/8657 22.4  0.0 - 40.0 mg/dL Final   LDL Cholesterol 01/09/2024 109 (H)  0 - 99 mg/dL Final   Total CHOL/HDL Ratio 01/09/2024 3   Final   NonHDL 01/09/2024 131.63   Final   VITD 01/09/2024 23.42 (L)  30.00 - 100.00 ng/mL Final  Office Visit on 11/24/2023  Component Date Value Ref Range Status   WBC 11/24/2023 8.0  4.0 - 10.5 K/uL Final   RBC 11/24/2023 4.34  3.87 - 5.11 Mil/uL Final   Hemoglobin 11/24/2023 14.2  12.0 - 15.0 g/dL Final   HCT 84/69/6295 41.4  36.0 - 46.0 % Final   MCV 11/24/2023 95.6  78.0 - 100.0 fl Final   MCHC 11/24/2023 34.3  30.0 - 36.0 g/dL Final   RDW 28/41/3244 13.4  11.5 - 15.5 % Final   Platelets 11/24/2023 214.0  150.0 - 400.0 K/uL Final   Neutrophils Relative % 11/24/2023 64.3  43.0 - 77.0 % Final   Lymphocytes Relative 11/24/2023 28.1  12.0 - 46.0 % Final   Monocytes Relative 11/24/2023 6.1  3.0 - 12.0 % Final   Eosinophils Relative 11/24/2023 0.7  0.0 - 5.0 % Final   Basophils Relative 11/24/2023 0.8  0.0 - 3.0 % Final   Neutro Abs 11/24/2023 5.2  1.4 - 7.7 K/uL Final   Lymphs Abs 11/24/2023 2.2  0.7 - 4.0 K/uL Final   Monocytes Absolute 11/24/2023 0.5  0.1 - 1.0 K/uL Final   Eosinophils Absolute 11/24/2023 0.1  0.0 - 0.7 K/uL Final   Basophils Absolute 11/24/2023 0.1  0.0 - 0.1 K/uL Final   Sodium 11/24/2023 136  135 - 145 mEq/L Final   Potassium 11/24/2023 3.7  3.5 - 5.1 mEq/L Final   Chloride  11/24/2023 101  96 - 112 mEq/L Final   CO2 11/24/2023 26  19 - 32 mEq/L Final   Glucose, Bld 11/24/2023 81  70 - 99 mg/dL Final   BUN 12/11/7251 11  6 - 23 mg/dL Final   Creatinine, Ser 11/24/2023 0.74  0.40 - 1.20 mg/dL Final   Total Bilirubin 11/24/2023 0.6  0.2 - 1.2 mg/dL Final   Alkaline Phosphatase 11/24/2023 29 (L)  39 -  117 U/L Final   AST 11/24/2023 18  0 - 37 U/L Final   ALT 11/24/2023 14  0 - 35 U/L Final   Total Protein 11/24/2023 7.5  6.0 - 8.3 g/dL Final   Albumin 91/47/8295 4.6  3.5 - 5.2 g/dL Final   GFR 62/13/0865 106.85  >60.00 mL/min Final   Calcium 11/24/2023 9.5  8.4 - 10.5 mg/dL Final   Color, Urine 78/46/9629 YELLOW  Yellow;Lt. Yellow;Straw;Dark Yellow;Amber;Green;Red;Brown Final   APPearance 11/24/2023 CLEAR  Clear;Turbid;Slightly Cloudy;Cloudy Final   Specific Gravity, Urine 11/24/2023 <=1.005 (A)  1.000 - 1.030 Final   pH 11/24/2023 6.0  5.0 - 8.0 Final   Total Protein, Urine 11/24/2023 NEGATIVE  Negative Final   Urine Glucose 11/24/2023 NEGATIVE  Negative Final   Ketones, ur 11/24/2023 TRACE (A)  Negative Final   Bilirubin Urine 11/24/2023 NEGATIVE  Negative Final   Hgb urine dipstick 11/24/2023 NEGATIVE  Negative Final   Urobilinogen, UA 11/24/2023 0.2  0.0 - 1.0 Final   Leukocytes,Ua 11/24/2023 NEGATIVE  Negative Final   Nitrite 11/24/2023 NEGATIVE  Negative Final   WBC, UA 11/24/2023 0-2/hpf  0-2/hpf Final   RBC / HPF 11/24/2023 none seen  0-2/hpf Final   Squamous Epithelial / HPF 11/24/2023 Rare(0-4/hpf)  Rare(0-4/hpf) Final   MICRO NUMBER: 11/24/2023 [id number]   Final   SPECIMEN QUALITY: 11/24/2023 Adequate   Final   Sample Source 11/24/2023 NOT GIVEN   Final   STATUS: 11/24/2023 FINAL   Final   Result: 11/24/2023 No Growth   Final   Lipase 11/24/2023 19.0  11.0 - 59.0 U/L Final   Preg Test, Ur 11/24/2023 Negative  Negative Final  Office Visit on 12/13/2022  Component Date Value Ref Range Status   WBC 12/13/2022 6.7  4.0 - 10.5 K/uL Final   RBC  12/13/2022 4.49  3.87 - 5.11 Mil/uL Final   Hemoglobin 12/13/2022 14.3  12.0 - 15.0 g/dL Final   HCT 40/09/2724 42.0  36.0 - 46.0 % Final   MCV 12/13/2022 93.6  78.0 - 100.0 fl Final   MCHC 12/13/2022 34.1  30.0 - 36.0 g/dL Final   RDW 36/64/4034 13.4  11.5 - 15.5 % Final   Platelets 12/13/2022 232.0  150.0 - 400.0 K/uL Final   Neutrophils Relative % 12/13/2022 65.9  43.0 - 77.0 % Final   Lymphocytes Relative 12/13/2022 26.9  12.0 - 46.0 % Final   Monocytes Relative 12/13/2022 6.0  3.0 - 12.0 % Final   Eosinophils Relative 12/13/2022 0.7  0.0 - 5.0 % Final   Basophils Relative 12/13/2022 0.5  0.0 - 3.0 % Final   Neutro Abs 12/13/2022 4.4  1.4 - 7.7 K/uL Final   Lymphs Abs 12/13/2022 1.8  0.7 - 4.0 K/uL Final   Monocytes Absolute 12/13/2022 0.4  0.1 - 1.0 K/uL Final   Eosinophils Absolute 12/13/2022 0.0  0.0 - 0.7 K/uL Final   Basophils Absolute 12/13/2022 0.0  0.0 - 0.1 K/uL Final   Sodium 12/13/2022 140  135 - 145 mEq/L Final   Potassium 12/13/2022 4.5  3.5 - 5.1 mEq/L Final   Chloride 12/13/2022 103  96 - 112 mEq/L Final   CO2 12/13/2022 28  19 - 32 mEq/L Final   Glucose, Bld 12/13/2022 82  70 - 99 mg/dL Final   BUN 74/25/9563 12  6 - 23 mg/dL Final   Creatinine, Ser 12/13/2022 0.72  0.40 - 1.20 mg/dL Final   Total Bilirubin 12/13/2022 0.8  0.2 - 1.2 mg/dL Final   Alkaline Phosphatase  12/13/2022 33 (L)  39 - 117 U/L Final   AST 12/13/2022 14  0 - 37 U/L Final   ALT 12/13/2022 10  0 - 35 U/L Final   Total Protein 12/13/2022 7.4  6.0 - 8.3 g/dL Final   Albumin 16/09/9603 4.7  3.5 - 5.2 g/dL Final   GFR 54/08/8118 111.16  >60.00 mL/min Final   Calcium 12/13/2022 10.0  8.4 - 10.5 mg/dL Final   Cholesterol 14/78/2956 195  0 - 200 mg/dL Final   Triglycerides 21/30/8657 74.0  0.0 - 149.0 mg/dL Final   HDL 84/69/6295 50.80  >39.00 mg/dL Final   VLDL 28/41/3244 14.8  0.0 - 40.0 mg/dL Final   LDL Cholesterol 12/13/2022 129 (H)  0 - 99 mg/dL Final   Total CHOL/HDL Ratio 12/13/2022 4    Final   NonHDL 12/13/2022 144.01   Final  Hospital Outpatient Visit on 06/26/2022  Component Date Value Ref Range Status   AR max vel 06/26/2022 4.70  cm2 Final   AV Area VTI 06/26/2022 4.21  cm2 Final   AV Mean grad 06/26/2022 1.3  mmHg Final   AV Peak grad 06/26/2022 2.7  mmHg Final   Ao pk vel 06/26/2022 0.83  m/s Final   AV Area mean vel 06/26/2022 5.08  cm2 Final   Area-P 1/2 06/26/2022 4.19  cm2 Final   S' Lateral 06/26/2022 3.30  cm Final   MV M vel 06/26/2022 5.02  m/s Final   MV Peak grad 06/26/2022 100.8  mmHg Final   Radius 06/26/2022 0.50  cm Final  No image results found. US  PELVIS TRANSVAGINAL NON-OB (TV ONLY) Result Date: 04/08/2024 7.07cm uterus No masses, normal size Normal ovaries (cyst resolved) Thin EML        04/13/2024    3:03 PM 01/09/2024    9:06 AM 11/24/2023    3:07 PM 12/13/2022    8:57 AM  PHQ 2/9 Scores  PHQ - 2 Score 0 0 0 0  PHQ- 9 Score  0 0    Results LABS Urinalysis: Faintly positive for blood and leukocytes (05/24/2024) HbA1c: Normal  RADIOLOGY CT Abdomen: Atrophy of right kidney, compensatory hypertrophy of left kidney Mammogram: Normal Breast Ultrasound: Palpable lymph nodes in the axilla Head MRI: Microhemorrhages    Assessment & Plan Hematuria, unspecified type Watchful waiting recommended. Suspicious for intercourse trauma related, possibly also related with hormonal deficiency.  Recent negative gynecology exam.  Faint amount. No signs & symptoms kidney stones.  Will send for culture(s).   Frequency of urination Dipstick done Results for orders placed or performed in visit on 05/24/24  POCT Urinalysis Dipstick (Automated)   Collection Time: 05/24/24 11:31 AM  Result Value Ref Range   Color, UA yellow    Clarity, UA cloudy    Glucose, UA Negative Negative   Bilirubin, UA negative    Ketones, UA negative    Spec Grav, UA 1.010 1.010 - 1.025   Blood, UA +-10    pH, UA 6.0 5.0 - 8.0   Protein, UA Negative Negative    Urobilinogen, UA 0.2 0.2 or 1.0 E.U./dL   Nitrite, UA negative    Leukocytes, UA Trace (A) Negative    Infective urethritis She presents with frequent urination and hematuria post-coitus. Urinalysis shows faintly positive blood and leukocytes, indicating inflammation or infection, likely postcoital cystitis due to urethral orifice inflammation. A sexually transmitted infection is unlikely given her history and symptoms. Although minor UTIs may resolve spontaneously, antibiotics are recommended due to her kidney  history and infection risk. Risks of antibiotics include fungal infections and sensitivity reactions. Prescribe Bactrim for potential UTI. Send a urine sample for culture. Advise starting antibiotics if symptoms worsen or persist. Encourage increased fluid intake to flush the urinary tract and educate on urinating post-coitus to prevent UTIs. Atrophy of right kidney She has right kidney atrophy with compensatory left kidney function and is under nephrologist care with annual check-ups advised. There is a need for caution regarding potential kidney infections due to her kidney history. She is informed about signs of kidney infection and advised to return if symptoms occur. Monitor for signs of kidney infection, such as flank pain, and advise returning if experiencing symptoms indicative of a kidney infection. Lab Results  Component Value Date   GFR 106.85 11/24/2023   GFR 111.16 12/13/2022   GFR 113.84 12/12/2021   GFR 91.62 10/20/2019   GFR 101.27 04/02/2019   Hormone disorder She experiences irregular menstrual cycles and symptoms suggestive of low estrogen levels, suspected to be a hormonal imbalance possibly related to an underdeveloped pituitary gland due to her unique medical history, including an absent right carotid artery and childhood surgeries. She is not on hormone replacement therapy due to cardiac risks and personal preference. Discuss potential benefits and risks of hormone  therapy if symptoms persist or worsen. Consider referral to an endocrinologist if symptoms of hormonal imbalance become more pronounced.  She is advised to maintain a healthy lifestyle, including adequate hydration and sodium intake, especially given hypotension and potential endocrine issues. Discuss the importance of hydration and sodium for maintaining blood pressure and kidney health. Encourage increased fluid and sodium intake to support blood pressure and kidney function.       Orders Placed in Encounter:   Lab Orders         Urine Culture         POCT Urinalysis Dipstick (Automated)     TIME-BASED BILLING ATTESTATION   ?? TIME SUMMARY  On the day of service, I personally spent 34 minutes in total on direct and indirect patient care activities--this time was medically necessary to thoroughly address all of the patient's questions and ensure their understanding and satisfaction. I carefully reviewed and analyzed all relevant clinical data to support nuanced, shared clinical decision-making and deliver highly personalized care. The patient was informed to call the clinic promptly with any subsequent questions, problems, or concerns.    ?? ACTIVITIES PERSONALLY PERFORMED (contributing to total time):  ?? PRE-VISIT PREPARATION   ? Records Review: I reviewed the patient's records before and during the visit to support individualized clinical decision making.  ???? DIRECT PATIENT CARE   ? History: I obtained, documented, and reviewed a thorough medical history. I reviewed the patient's reported symptoms and clarified their context and significance in relation to the current visit.  ? Examination: I conducted a medically appropriate physical evaluation.  ? Communication: I communicated clinical status and plan to the patient/caregiver.  ? Counseling & Education: I provided personalized counseling on condition and treatment.  ? Provided clinical recommendations for Atrophy of right kidney   ?  Provided clinical recommendations for She experiences irregular menstrual cycles and symptoms suggestive of low estrogen levels, suspected to be a hormonal imbalance possibly related to an underdeveloped pituitary gland due to her unique medical history, including an absent right carotid artery and childhood surgeries. She is not on hormone replacement therapy due to cardiac risks and personal preference. Discuss potential benefits and risks of hormone therapy if symptoms persist  or worsen. Consider referral to an endocrinologist if symptoms of hormonal imbalance become more pronounced.   ? Treatment Plan: I worked collaboratively with the patient to formulate and communicate an individualized plan (including shared decision-making).  ? Risk/Benefit Discussion: I discussed risks, benefits, and possible outcomes of treatment options.  ? Shared Decision-Making: I engaged the patient/caregiver in shared decision-making based on preferences and evidence.  ?? MEDICAL DECISION MAKING   ? Data Synthesis: I synthesized EMR information for clinical decision-making.  ?? DOCUMENTATION & ORDERS   ? Orders: I placed necessary orders (medications, labs, imaging, referrals) in the EMR.  ? Ordered diagnostic culture studies for Infective urethritis, deferred bloodwork   ?? CARE COORDINATION   ? Referrals & Communication: I referred and communicated with other healthcare professionals, coordinating the patients care.   ?? EXTENDED TIME FACTORS:  The extended time spent was necessary to provide safe, effective, and comprehensive care due to the following factors:    Extensive Comorbidities: The patient's multiple chronic conditions necessitated careful coordination, monitoring, and integration of care plans.  In particular her extremely unusual missing right carotid artery.   Data Analysis & Complex Decision-Making: I performed in-depth data review and complex treatment planning tailored to the patient's unique  clinical profile.   Patient Requested Extended Education & Communication: At the patient's request, I spent additional time educating the patient to ensure understanding of complex health information, answer questions, and support shared decision-making--time that exceeded a typical visit duration and was not separately billable.   This time was spent independently of any separately billable procedures. Please note that this statement is intended to provide a clear and comprehensive account of the time and services provided during the patient's visit.        This document was synthesized by artificial intelligence ([employer]) using HIPAA-compliant recording of the clinical interaction;   We discussed the use of AI scribe software for clinical note transcription with the patient, who gave verbal consent to proceed. additional Info: This encounter employed state-of-the-art, real-time, collaborative documentation. The patient actively reviewed and assisted in updating their electronic medical record on a shared screen, ensuring transparency and facilitating joint problem-solving for the problem list, overview, and plan. This approach promotes accurate, informed care. The treatment plan was discussed and reviewed in detail, including medication safety, potential side effects, and all patient questions. We confirmed understanding and comfort with the plan. Follow-up instructions were established, including contacting the office for any concerns, returning if symptoms worsen, persist, or new symptoms develop, and precautions for potential emergency department visits.

## 2024-05-24 NOTE — Patient Instructions (Signed)
 VISIT SUMMARY:  Today, you were seen for urinary frequency and blood in your urine. We discussed your symptoms, medical history, and performed a urinalysis. Based on the findings, we have a plan to address your current issues and maintain your overall health.  YOUR PLAN:  -URETHRITIS: Urethritis is inflammation of the urethra, often causing frequent urination and blood in the urine. Your urinalysis showed signs of inflammation or infection, likely due to postcoital cystitis. We recommend starting Bactrim if your symptoms worsen or persist. Drink plenty of fluids to help flush your urinary tract and try to urinate after sexual activity to prevent future infections.  -ATROPHY OF RIGHT KIDNEY: Right kidney atrophy means your right kidney is smaller and less functional, with your left kidney compensating. You should be cautious about potential kidney infections. Watch for symptoms like flank pain and return if you experience any signs of a kidney infection. Continue your annual check-ups with your nephrologist.  -HORMONAL IMBALANCE: Hormonal imbalance can cause irregular menstrual cycles and other symptoms. This may be related to your unique medical history. We discussed the potential benefits and risks of hormone therapy. If your symptoms persist or worsen, we may consider referring you to an endocrinologist.  -GENERAL HEALTH MAINTENANCE: Maintain a healthy lifestyle with adequate hydration and sodium intake, especially given your low blood pressure and potential endocrine issues. This will help support your blood pressure and kidney function.  INSTRUCTIONS:  Start taking Bactrim if your urinary symptoms worsen or do not improve. Drink plenty of fluids and try to urinate after sexual activity. Monitor for signs of kidney infection, such as flank pain, and return if you experience any symptoms. Continue your annual check-ups with your nephrologist. If your hormonal symptoms persist or worsen, we may  consider referring you to an endocrinologist.

## 2024-05-24 NOTE — Assessment & Plan Note (Signed)
 She has right kidney atrophy with compensatory left kidney function and is under nephrologist care with annual check-ups advised. There is a need for caution regarding potential kidney infections due to her kidney history. She is informed about signs of kidney infection and advised to return if symptoms occur. Monitor for signs of kidney infection, such as flank pain, and advise returning if experiencing symptoms indicative of a kidney infection. Lab Results  Component Value Date   GFR 106.85 11/24/2023   GFR 111.16 12/13/2022   GFR 113.84 12/12/2021   GFR 91.62 10/20/2019   GFR 101.27 04/02/2019

## 2024-05-24 NOTE — Assessment & Plan Note (Signed)
 She experiences irregular menstrual cycles and symptoms suggestive of low estrogen levels, suspected to be a hormonal imbalance possibly related to an underdeveloped pituitary gland due to her unique medical history, including an absent right carotid artery and childhood surgeries. She is not on hormone replacement therapy due to cardiac risks and personal preference. Discuss potential benefits and risks of hormone therapy if symptoms persist or worsen. Consider referral to an endocrinologist if symptoms of hormonal imbalance become more pronounced.

## 2024-05-24 NOTE — Telephone Encounter (Signed)
 FYI Only or Action Required?: FYI only for provider  Patient was last seen in primary care on 03/01/2024 by Alexander Iba, PA. Called Nurse Triage reporting Urinary Frequency and Hematuria. Symptoms began yesterday. Interventions attempted: Nothing. Symptoms are: hematuria (pink blood), urinary frequency, feeling sensation of relief after urinating stable.  Triage Disposition: See Physician Within 24 Hours  Patient/caregiver understands and will follow disposition?: Yes                       Copied from CRM [id number]. Topic: Clinical - Red Word Triage >> May 24, 2024  8:22 AM Rosamond Comes wrote: Red Word that prompted transfer to Nurse Triage: patient calling, felling a weird sensation when urinating, frequently, there is pink when wiping, patient stated her right kidney not working Reason for Disposition  Blood in urine  (Exception: Could be normal menstrual bleeding.)  Answer Assessment - Initial Assessment Questions 1. COLOR of URINE: Describe the color of the urine.  (e.g., tea-colored, pink, red, bloody) Do you have blood clots in your urine? (e.g., none, pea, grape, small coin)     Pink blood. When she wiped sometimes she saw blood on the toilet tissue.  2. ONSET: When did the bleeding start?      Sunday  (yesterday).  3. EPISODES: How many times has there been blood in the urine? or How many times today?     3-4 times.  4. PAIN with URINATION: Is there any pain with passing your urine? If Yes, ask: How bad is the pain?  (Scale 1-10; or mild, moderate, severe)    - MILD: Complains slightly about urination hurting.    - MODERATE: Interferes with normal activities.      - SEVERE: Excruciating, unwilling or unable to urinate because of the pain.      No.  5. FEVER: Do you have a fever? If Yes, ask: What is your temperature, how was it measured, and when did it start?     No.  6. ASSOCIATED SYMPTOMS: Are you passing urine more frequently than  usual?     Yes.  7. OTHER SYMPTOMS: Do you have any other symptoms? (e.g., back/flank pain, abdomen pain, vomiting)     Urinary frequency, relief sensation after urinating (feels similar to chills).  8. PREGNANCY: Is there any chance you are pregnant? When was your last menstrual period?     No. LMP: 05/04/24.  Patient denies abdominal or back pain, burning or painful urination, fever, nausea, vomiting.  Protocols used: Urine - Blood In-A-AH

## 2024-05-26 ENCOUNTER — Ambulatory Visit: Payer: Self-pay | Admitting: Internal Medicine

## 2024-05-26 NOTE — Progress Notes (Signed)
 Release results to patient

## 2024-05-27 LAB — URINE CULTURE
MICRO NUMBER:: 16584723
SPECIMEN QUALITY:: ADEQUATE

## 2024-06-02 ENCOUNTER — Other Ambulatory Visit (INDEPENDENT_AMBULATORY_CARE_PROVIDER_SITE_OTHER)

## 2024-06-02 ENCOUNTER — Ambulatory Visit: Payer: Self-pay | Admitting: Physician Assistant

## 2024-06-02 ENCOUNTER — Other Ambulatory Visit: Payer: Self-pay | Admitting: Physician Assistant

## 2024-06-02 DIAGNOSIS — R319 Hematuria, unspecified: Secondary | ICD-10-CM

## 2024-06-02 LAB — URINALYSIS, ROUTINE W REFLEX MICROSCOPIC
Bilirubin Urine: NEGATIVE
Hgb urine dipstick: NEGATIVE
Ketones, ur: NEGATIVE
Leukocytes,Ua: NEGATIVE
Nitrite: NEGATIVE
RBC / HPF: NONE SEEN (ref 0–?)
Specific Gravity, Urine: <=1.005 — AB (ref 1.000–1.030)
Total Protein, Urine: NEGATIVE
Urine Glucose: NEGATIVE
Urobilinogen, UA: 0.2 (ref 0.0–1.0)
WBC, UA: NONE SEEN (ref 0–?)
pH: 6 (ref 5.0–8.0)

## 2024-06-03 NOTE — Telephone Encounter (Signed)
 Patient viewed results and recommendations via mychart.  Urinalysis is normal  Written by Lucie Buttner, PA on 06/02/2024  4:42 PM EDT Seen by patient Cathy Vasquez on 06/02/2024  4:55 PM

## 2024-06-08 ENCOUNTER — Ambulatory Visit: Payer: Self-pay | Admitting: Cardiovascular Disease

## 2024-06-08 ENCOUNTER — Ambulatory Visit (HOSPITAL_COMMUNITY)
Admission: RE | Admit: 2024-06-08 | Discharge: 2024-06-08 | Disposition: A | Discharge status: Home / Self Care | Source: Ambulatory Visit | Attending: Cardiovascular Disease | Admitting: Cardiovascular Disease

## 2024-06-08 ENCOUNTER — Encounter (HOSPITAL_COMMUNITY): Payer: 59

## 2024-06-08 DIAGNOSIS — Z954 Presence of other heart-valve replacement: Secondary | ICD-10-CM | POA: Diagnosis not present

## 2024-06-08 DIAGNOSIS — I340 Nonrheumatic mitral (valve) insufficiency: Secondary | ICD-10-CM | POA: Insufficient documentation

## 2024-06-08 DIAGNOSIS — R002 Palpitations: Secondary | ICD-10-CM | POA: Insufficient documentation

## 2024-06-08 DIAGNOSIS — R9439 Abnormal result of other cardiovascular function study: Secondary | ICD-10-CM | POA: Diagnosis not present

## 2024-06-15 ENCOUNTER — Encounter: Payer: Self-pay | Admitting: Dermatology

## 2024-06-15 ENCOUNTER — Ambulatory Visit: Payer: 59 | Admitting: Dermatology

## 2024-06-15 DIAGNOSIS — L700 Acne vulgaris: Secondary | ICD-10-CM | POA: Diagnosis not present

## 2024-06-15 DIAGNOSIS — D2270 Melanocytic nevi of unspecified lower limb, including hip: Secondary | ICD-10-CM

## 2024-06-15 DIAGNOSIS — D229 Melanocytic nevi, unspecified: Secondary | ICD-10-CM

## 2024-06-15 NOTE — Patient Instructions (Addendum)
 Date: Tue Jun 15 2024  Hello Cathy Vasquez,  Thank you for visiting today. Here is a summary of the key instructions:  - Morning Routine:   - Alternate between Panoxyl and salicylic acid wash   - Apply clindamycin  gel   - Use moisturizer with sunscreen  - Evening Routine:   - Wash face   - Apply tretinoin  on Monday, Wednesday, and Friday   - Use moisturizer  - Weekend Care:   - Use Marinda Schultze Ultra-Gentle peeling pads every other Saturday   - Follow two-step system: first pad for one minute, then second pad to neutralize   - Follow-up:   - Full body exam and acne follow-up in November  - Monitoring:   - Watch the spot on toe for any darkening  Please reach out if you have any questions or concerns.  Warm regards,  Dr. [name]        Important Information  Due to recent changes in healthcare laws, you may see results of your pathology and/or laboratory studies on MyChart before the doctors have had a chance to review them. We understand that in some cases there may be results that are confusing or concerning to you. Please understand that not all results are received at the same time and often the doctors may need to interpret multiple results in order to provide you with the best plan of care or course of treatment. Therefore, we ask that you please give us  2 business days to thoroughly review all your results before contacting the office for clarification. Should we see a critical lab result, you will be contacted sooner.   If You Need Anything After Your Visit  If you have any questions or concerns for your doctor, please call our main line at [phone] If no one answers, please leave a voicemail as directed and we will return your call as soon as possible. Messages left after 4 pm will be answered the following business day.   You may also send us  a message via MyChart. We typically respond to MyChart messages within 1-2 business days.  For  prescription refills, please ask your pharmacy to contact our office. Our fax number is [phone].  If you have an urgent issue when the clinic is closed that cannot wait until the next business day, you can page your doctor at the number below.    Please note that while we do our best to be available for urgent issues outside of office hours, we are not available 24/7.   If you have an urgent issue and are unable to reach us , you may choose to seek medical care at your doctor's office, retail clinic, urgent care center, or emergency room.  If you have a medical emergency, please immediately call 911 or go to the emergency department. In the event of inclement weather, please call our main line at [phone] for an update on the status of any delays or closures.  Dermatology Medication Tips: Please keep the boxes that topical medications come in in order to help keep track of the instructions about where and how to use these. Pharmacies typically print the medication instructions only on the boxes and not directly on the medication tubes.   If your medication is too expensive, please contact our office at [phone] or send us  a message through MyChart.   We are unable to tell what your co-pay for medications will be in advance as this is different depending on your insurance coverage.  However, we may be able to find a substitute medication at lower cost or fill out paperwork to get insurance to cover a needed medication.   If a prior authorization is required to get your medication covered by your insurance company, please allow us  1-2 business days to complete this process.  Drug prices often vary depending on where the prescription is filled and some pharmacies may offer cheaper prices.  The website www.goodrx.com contains coupons for medications through different pharmacies. The prices here do not account for what the cost may be with help from insurance (it may be cheaper with your  insurance), but the website can give you the price if you did not use any insurance.  - You can print the associated coupon and take it with your prescription to the pharmacy.  - You may also stop by our office during regular business hours and pick up a GoodRx coupon card.  - If you need your prescription sent electronically to a different pharmacy, notify our office through [facility] or by phone at [phone]

## 2024-06-15 NOTE — Progress Notes (Signed)
   Follow-Up Visit   Subjective  Shaden Higley is a 33 y.o. female who presents for the following: Atopic Derm - Acne  Patient present today for follow up visit. Patient was last evaluated on 01/06/24.   Acne: Prescribed Tretinoin  0.025% - 3 nights a week. She is alternating morning cleansers with OTC Panoxyl 4% and Inky 2% SA wash and she is still using Vanicream cleanser nightly. Sx are better.   Hand Derm: Prescribed Pimecrolimus  - use BID until clear. Sx are better as she only had 1 flare since last visit due to washing dishes without gloves.   Add on: She also wanted spot re-check on her L toe as she was informed at [facility] to keep an eye on it.   The following portions of the chart were reviewed this encounter and updated as appropriate: medications, allergies, medical history  Review of Systems:  No other skin or systemic complaints except as noted in HPI or Assessment and Plan.  Objective  Well appearing patient in no apparent distress; mood and affect are within normal limits.   A focused examination was performed of the following areas: face & hands   Relevant exam findings are noted in the Assessment and Plan.                       Assessment & Plan    1. Pigmented lesion on toe - Assessment: Pigmented lesion on the toe present for some time. Comparison with October images shows slight darkening, but shape and size remain consistent. Characteristic notch present. Patient reports not monitoring closely but believes it has remained stable.  - Plan:    Continue monitoring    Reassess in November    If further darkening observed, plan for excision  2. Acne vulgaris - Assessment: Patient reports improvement in cystic acne breakouts. Recently started alternating salicylic acid cleanser with Panoxyl in mornings and using VenaCream cleanser in evenings. Notes skin appears bumpy but not typical acne lesions, which started 1-2 weeks ago after initiating salicylic acid  use.  - Plan:    Morning routine:     - Alternate Panoxyl with salicylic acid wash     - Apply clindamycin  gel     - Follow with moisturizer with sunscreen    Evening routine:     - Cleanse     - Apply tretinoin  on Monday/Wednesday/Friday     - Follow with moisturizer    Introduce Marinda Schultze Ultra-Gentle peeling pads for weekend use:     - Two-step system: first pad for one minute, second pad to neutralize     - Use every other Saturday    Follow up in November for full body exam and acne reassessment    No follow-ups on file.   Documentation: I have reviewed the above documentation for accuracy and completeness, and I agree with the above.  I, Shirron Maranda, CMA, am acting as scribe for [employer], DO.   [name], DO

## 2024-06-19 DIAGNOSIS — Z419 Encounter for procedure for purposes other than remedying health state, unspecified: Secondary | ICD-10-CM | POA: Diagnosis not present

## 2024-07-19 ENCOUNTER — Telehealth: Payer: Self-pay | Admitting: Cardiovascular Disease

## 2024-07-19 DIAGNOSIS — Z954 Presence of other heart-valve replacement: Secondary | ICD-10-CM

## 2024-07-19 DIAGNOSIS — I340 Nonrheumatic mitral (valve) insufficiency: Secondary | ICD-10-CM

## 2024-07-19 NOTE — Telephone Encounter (Signed)
 Patient MyChart Message STAT if patient feels like he/she is going to faint   1. Are you feeling dizzy, lightheaded, or faint right now?     2. Have you passed out?   (If yes move to [facility])   3. Do you have any other symptoms?    4. Have you checked your HR and BP (record if available)?   am noticing when getting up from standing feeling lightheaded and dizzy more than half of the time. When reaching up with arms and bending over having the same sensation. It has been going on a a while now. Have mitral valve issues.

## 2024-07-19 NOTE — Telephone Encounter (Signed)
 Called patient back about message. Patient stated she has been having lightheaded/dizziness episodes more frequently when going from sitting to standing, reach over her head, and bending over. Patient stated at these times her BP is 100/60, but she is not passing out.  She also has times with her heart is racing, so she checks her BP and it is 130/75 HR 90, at those times she takes a bisoprolol  2.5 mg. Encouraged patient when she has the dizziness and low BP to drink some fluids and have a couple of salty crackers. Patient has follow-up in October but is concerned about her valves.  Reviewed echo results from [id number] Cathy JAYSON Delford, MD 06/26/2022 12:59 PM EDT     Overall good low normal EF and valves stable will likely get cardiac MRI/MRA again in 2 years if no symptoms    Will forward to Dr. Delford for further advisement. Will see if Dr. Delford would like patient to get cardiac MRI/MRA before her next office visit.

## 2024-07-20 DIAGNOSIS — Z419 Encounter for procedure for purposes other than remedying health state, unspecified: Secondary | ICD-10-CM | POA: Diagnosis not present

## 2024-07-22 NOTE — Telephone Encounter (Signed)
 [name], MD to Me   07/19/24  5:55 PM Can order echo for prior Ross procedure- just had stable carotids  Called patient back with Dr. Delford. Placed order for echo.

## 2024-07-30 ENCOUNTER — Ambulatory Visit: Payer: Self-pay | Admitting: Cardiovascular Disease

## 2024-07-30 ENCOUNTER — Ambulatory Visit (HOSPITAL_COMMUNITY)
Admission: RE | Admit: 2024-07-30 | Discharge: 2024-07-30 | Disposition: A | Discharge status: Home / Self Care | Source: Ambulatory Visit | Attending: Cardiovascular Disease | Admitting: Cardiovascular Disease

## 2024-07-30 DIAGNOSIS — I340 Nonrheumatic mitral (valve) insufficiency: Secondary | ICD-10-CM | POA: Diagnosis not present

## 2024-07-30 DIAGNOSIS — Z954 Presence of other heart-valve replacement: Secondary | ICD-10-CM | POA: Diagnosis not present

## 2024-07-30 DIAGNOSIS — I77819 Aortic ectasia, unspecified site: Secondary | ICD-10-CM

## 2024-07-30 LAB — ECHOCARDIOGRAM COMPLETE
Area-P 1/2: 4.31 cm2
MV M vel: 5.12 m/s
MV Peak grad: 104.9 mmHg
S' Lateral: 3.2 cm

## 2024-07-30 MED ORDER — PERFLUTREN LIPID MICROSPHERE
1.0000 mL | INTRAVENOUS | Status: AC | PRN
  Administered 2024-07-30: 3 mL via INTRAVENOUS

## 2024-07-30 NOTE — Progress Notes (Signed)
*  PRELIMINARY RESULTS* Echocardiogram 2D Echocardiogram has been performed with Definity .  Cathy Vasquez 07/30/2024, 10:50 AM

## 2024-08-20 DIAGNOSIS — Z419 Encounter for procedure for purposes other than remedying health state, unspecified: Secondary | ICD-10-CM | POA: Diagnosis not present

## 2024-08-25 ENCOUNTER — Other Ambulatory Visit: Payer: Self-pay | Admitting: Cardiovascular Disease

## 2024-08-25 DIAGNOSIS — Z01810 Encounter for preprocedural cardiovascular examination: Secondary | ICD-10-CM | POA: Diagnosis not present

## 2024-08-25 DIAGNOSIS — I77819 Aortic ectasia, unspecified site: Secondary | ICD-10-CM | POA: Diagnosis not present

## 2024-08-25 DIAGNOSIS — N261 Atrophy of kidney (terminal): Secondary | ICD-10-CM | POA: Diagnosis not present

## 2024-08-25 LAB — CBC/DIFF AMBIGUOUS DEFAULT
Basophils Absolute: 0.1 x10E3/uL (ref 0.0–0.2)
Basos: 1 %
EOS (ABSOLUTE): 0.1 x10E3/uL (ref 0.0–0.4)
Eos: 2 %
Hematocrit: 43.1 % (ref 34.0–46.6)
Hemoglobin: 14.2 g/dL (ref 11.1–15.9)
Immature Grans (Abs): 0 x10E3/uL (ref 0.0–0.1)
Immature Granulocytes: 0 %
Lymphocytes Absolute: 2.2 x10E3/uL (ref 0.7–3.1)
Lymphs: 29 %
MCH: 32.3 pg (ref 26.6–33.0)
MCHC: 32.9 g/dL (ref 31.5–35.7)
MCV: 98 fL — ABNORMAL HIGH (ref 79–97)
Monocytes Absolute: 0.4 x10E3/uL (ref 0.1–0.9)
Monocytes: 6 %
Neutrophils Absolute: 4.6 x10E3/uL (ref 1.4–7.0)
Neutrophils: 62 %
Platelets: 214 x10E3/uL (ref 150–450)
RBC: 4.39 x10E6/uL (ref 3.77–5.28)
RDW: 12.5 % (ref 11.7–15.4)
WBC: 7.4 x10E3/uL (ref 3.4–10.8)

## 2024-08-25 LAB — BASIC METABOLIC PANEL WITH GFR
BUN/Creatinine Ratio: 11 (ref 9–23)
BUN: 9 mg/dL (ref 6–20)
CO2: 21 mmol/L (ref 20–29)
Calcium: 9.4 mg/dL (ref 8.7–10.2)
Chloride: 100 mmol/L (ref 96–106)
Creatinine, Ser: 0.82 mg/dL (ref 0.57–1.00)
Glucose: 71 mg/dL (ref 70–99)
Potassium: 4.1 mmol/L (ref 3.5–5.2)
Sodium: 137 mmol/L (ref 134–144)
eGFR: 97 mL/min/1.73 (ref >59)

## 2024-08-25 LAB — SPECIMEN STATUS REPORT

## 2024-08-26 ENCOUNTER — Ambulatory Visit: Payer: Self-pay | Admitting: Cardiovascular Disease

## 2024-09-03 ENCOUNTER — Encounter (HOSPITAL_COMMUNITY): Payer: Self-pay

## 2024-09-06 ENCOUNTER — Ambulatory Visit (HOSPITAL_COMMUNITY): Admission: RE | Admit: 2024-09-06 | Source: Ambulatory Visit

## 2024-09-17 ENCOUNTER — Encounter: Payer: Self-pay | Admitting: Cardiovascular Disease

## 2024-09-21 NOTE — Progress Notes (Signed)
 CARDIOLOGY CONSULT NOTE       Patient ID: Cathy Vasquez MRN: [id number] DOB/AGE: 07-14-1991 33 y.o.  Referring Physician: Joane Sar  Primary Physician: Job Lukes, PA Primary Cardiologist: Delford Reason for Consultation: Congenital heart disease    HPI:  33 y.o. referred by Dr Sar for congenital heart disease. She had aortic stenosis and underwent Ross Procedure in 2004. She had surgery before this at age 47 for atretic valve and right common carotid became occluded   She had two successful pregnancies in 2014, and 2017.  F/U TTE showed moderate MR likely with some MVP. Prior to McNary procedure she had 3 balloon valvuloplasties   TTE 11/15/19 EF 55-60% Normal RV Mild AR, mild-moderate MR estimated PA 37 mmHg mild PR MRA 09/13/19 aortic sinus 4.3 cm , ascending aorta 3.5 cm descending aorta 1.6 cm  TTE 06/26/22 EF 50-55% mild MR mild AR Aortic root 45 mm mild PR and no PS  MRI 09/05/21 sinus only 3.9 cm normal EF   Noted fatigue with lopressor changed to bisoprolol  2.5 mg Still with fatigue and low BP All beta blocker stopped only to use PRN Discussed salt liberalization  Using Cleosin for SBE  Home schooling 8/11 yo children   Seen by EP Camnitz for palpitations 03/04/23 Only suggestion was to obtain Kardia Monitor and 14 day Zio with 1% PVC burden 04/07/23   TTE done 07/30/24 EF 55-60% normal RV anterior mitral prolapse with mild MR. Mild AR aortic root 44 mm no PS trivial PR To my review there is restricted posterior leaflet motion and some chordal tethering of the anterior leaflet Seems more parachute like   She has had some issues getting aortic MRA or cardiac MRI approved by insurance Cardiac MRI done 09/05/21 showed only mild PR and mild AR with somewhat hypoplastic bovine arch no coarctation PA's normal with no atresia.   She has a single functioning kidney and sees Dr Gearline now Cr 0.82 on 08/25/24 She has some postural symptoms.    ROS All other systems reviewed and  negative except as noted above  Past Medical History:  Diagnosis Date   Allergy    Tree nuts   Aortic aneurysm    Aortic stenosis    Atopic eczema 10/15/2018   Gastroesophageal reflux disease without esophagitis 12/29/2015   - discussed lifestyle changes - prescribed ranitidine 12/29/15 Rx Zantac 150 mg # 60 BID with refills x 5 given(01/19/16)   Heart disease    Heart murmur    Migraines     Family History  Problem Relation Age of Onset   Hypertension Mother    Arthritis Mother        2 knee surgeries   Hypertension Father    Hyperlipidemia Father    Healthy Sister    Healthy Sister    Heart disease Maternal Grandmother    Valvular heart disease Maternal Grandmother    Alcohol abuse Maternal Grandfather    Suicidality Maternal Grandfather    Dementia Paternal Grandmother    COPD Paternal Grandmother    Diabetes Paternal Actor    Healthy Daughter    Healthy Son    Migraines Maternal Aunt     Social History   Socioeconomic History   Marital status: Married    Spouse name: Darryle   Number of children: 2   Years of education: Not on file   Highest education level: Some college, no degree  Occupational History   Occupation: stay at home  Tobacco Use  Smoking status: Never   Smokeless tobacco: Never  Vaping Use   Vaping status: Never Used  Substance and Sexual Activity   Alcohol use: Never   Drug use: Never   Sexual activity: Yes    Partners: Male    Birth control/protection: Other-see comments    Comment: Vasectomy   Other Topics Concern   Not on file  Social History Narrative   Lives with family   Married   Stays at home   Husband is a Education officer, environmental   Two children - boy and girl   Social Drivers of Corporate investment banker Strain: Patient Declined (07/15/2023)   Overall Financial Resource Strain (CARDIA)    Difficulty of Paying Living Expenses: Patient declined  Food Insecurity: Patient Declined (07/15/2023)   Hunger Vital Sign    Worried About  Running Out of Food in the Last Year: Patient declined    Ran Out of Food in the Last Year: Patient declined  Transportation Needs: Patient Declined (07/15/2023)   PRAPARE - Administrator, Civil Service (Medical): Patient declined    Lack of Transportation (Non-Medical): Patient declined  Physical Activity: Unknown (07/15/2023)   Exercise Vital Sign    Days of Exercise per Week: Patient declined    Minutes of Exercise per Session: Not on file  Stress: Patient Declined (07/15/2023)   [employer] of Occupational Health - Occupational Stress Questionnaire    Feeling of Stress : Patient declined  Social Connections: Unknown (07/15/2023)   Social Connection and Isolation Panel    Frequency of Communication with Friends and Family: Patient declined    Frequency of Social Gatherings with Friends and Family: Patient declined    Attends Religious Services: Patient declined    Active Member of Clubs or Organizations: Patient declined    Attends Banker Meetings: Not on file    Marital Status: Patient declined  Intimate Partner Violence: Not on file    Past Surgical History:  Procedure Laterality Date   CARDIAC SURGERY  05/17/2003   Post Ross Procedure   CARDIAC VALVE REPLACEMENT     ROSS [facility] PROCEDURE  2004   VENOUS ABLATION Right 2020   varicose veins/venous insufficiency   WISDOM TOOTH EXTRACTION        Current Outpatient Medications:    b complex vitamins capsule, Take 1 capsule by mouth daily., Disp: , Rfl:    Benzoyl Peroxide (PANOXYL WASH EX), Apply topically., Disp: , Rfl:    bisoprolol  (ZEBETA ) 5 MG tablet, Take 0.5 tablets (2.5 mg total) by mouth as needed (palpitations)., Disp: 30 tablet, Rfl: 2   cetirizine (ZYRTEC) 10 MG tablet, Take 10 mg by mouth daily., Disp: , Rfl:    clindamycin  (CLEOCIN ) 300 MG capsule, Take 2 capsules 30-60 minutes before your appointment as needed, Disp: 10 capsule, Rfl: 1   clindamycin  (CLINDAGEL) 1 % gel, APPLY TOPICALLY  TWICE A DAY, Disp: 30 g, Rfl: 2   ELIDEL  1 % cream, Apply topically 2 (two) times daily., Disp: 60 g, Rfl: 5   EPINEPHrine  0.3 mg/0.3 mL IJ SOAJ injection, Inject 0.3 mg into the muscle as needed for anaphylaxis., Disp: 1 each, Rfl: 2   fluticasone (FLONASE) 50 MCG/ACT nasal spray, Place into both nostrils as needed., Disp: , Rfl:    Rimegepant Sulfate (NURTEC) 75 MG TBDP, Take 1 tablet (75 mg total) by mouth as needed., Disp: 8 tablet, Rfl: 5   tiZANidine  (ZANAFLEX ) 4 MG tablet, Take 0.5-1 tablets (2-4 mg total) by mouth  every 8 (eight) hours as needed for muscle spasms., Disp: 30 tablet, Rfl: 1   tretinoin  (RETIN-A ) 0.025 % cream, Apply topically at bedtime. Use only on Mon, Wed, Fri, Disp: 45 g, Rfl: 2   triamcinolone  ointment (KENALOG ) 0.1 %, 1 APP ONCE A DAY AT BEDTIME as needed APPLIED TOPICALLY, Disp: 80 g, Rfl: 0   Vitamin D , Cholecalciferol, 25 MCG (1000 UT) CAPS, Take 2 capsules by mouth daily in the afternoon., Disp: , Rfl:     Physical Exam: Blood pressure 130/74, pulse 60, resp. rate 16, height 5' 1 (1.549 m), weight 118 lb 3.2 oz (53.6 kg), SpO2 99%.   Affect appropriate Healthy:  appears stated age HEENT: normal Neck supple with no adenopathy JVP normal no bruits no thyromegaly Lungs clear with no wheezing and good diaphragmatic motion Heart:  S1/S2 PR murmur LUSB MR murmur apex  no rub, gallop or click PMI normal post sternotomy  Abdomen: benighn, BS positve, no tenderness, no AAA no bruit.  No HSM or HJR Distal pulses intact with no bruits No edema Neuro non-focal Skin warm and dry No muscular weakness   Labs:   Lab Results  Component Value Date   WBC 7.4 08/25/2024   HGB 14.2 08/25/2024   HCT 43.1 08/25/2024   MCV 98 (H) 08/25/2024   PLT 214 08/25/2024   No results for input(s): NA, K, CL, CO2, BUN, CREATININE, CALCIUM, PROT, BILITOT, ALKPHOS, ALT, AST, GLUCOSE in the last 168 hours.  Invalid input(s): LABALBU No results found  for: CKTOTAL, CKMB, CKMBINDEX, TROPONINI  Lab Results  Component Value Date   CHOL 188 01/09/2024   CHOL 195 12/13/2022   CHOL 200 12/12/2021   Lab Results  Component Value Date   HDL 56.20 01/09/2024   HDL 50.80 12/13/2022   HDL 49.30 12/12/2021   Lab Results  Component Value Date   LDLCALC 109 (H) 01/09/2024   LDLCALC 129 (H) 12/13/2022   LDLCALC 133 (H) 12/12/2021   Lab Results  Component Value Date   TRIG 112.0 01/09/2024   TRIG 74.0 12/13/2022   TRIG 87.0 12/12/2021   Lab Results  Component Value Date   CHOLHDL 3 01/09/2024   CHOLHDL 4 12/13/2022   CHOLHDL 4 12/12/2021   No results found for: LDLDIRECT    Radiology: No results found.  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 Pulmonary homograft mild PR/ no PS Allergic to PCN  Can use Clindamycin  600 mg or Azithromycin 500 mg 1 hour before procedures  MR:  ? Parachute mild MR on TTE 06/26/22  Carotid: occluded right, duplex 06/08/24 with no left sided dx  Palpitations : benign monitor 1% PVC burden 03/12/23  no significant arrhythmia / PAF Aorta:  root dilatation Sinus only 3.9 cm on MRI 09/05/21  Renal:  single functional kidney atrophic on right. F/U Gearline Avoid nephrotoxic agents and NSAI's  Would be beneficial to repeat cardiac MRI to further assess aortic sinus/root, PV and regurgitant lesions   F/U in 6 months     Signed: Maude Emmer 09/30/2024, 9:31 AM

## 2024-09-23 ENCOUNTER — Ambulatory Visit (HOSPITAL_COMMUNITY)

## 2024-09-23 ENCOUNTER — Encounter (HOSPITAL_COMMUNITY): Payer: Self-pay

## 2024-09-30 ENCOUNTER — Ambulatory Visit: Attending: Internal Medicine | Admitting: Cardiovascular Disease

## 2024-09-30 ENCOUNTER — Encounter: Payer: Self-pay | Admitting: Cardiovascular Disease

## 2024-09-30 VITALS — BP 130/74 | HR 60 | Resp 16 | Ht 61.0 in | Wt 118.2 lb

## 2024-09-30 DIAGNOSIS — I719 Aortic aneurysm of unspecified site, without rupture: Secondary | ICD-10-CM | POA: Insufficient documentation

## 2024-09-30 DIAGNOSIS — Z954 Presence of other heart-valve replacement: Secondary | ICD-10-CM | POA: Diagnosis not present

## 2024-09-30 DIAGNOSIS — I6521 Occlusion and stenosis of right carotid artery: Secondary | ICD-10-CM | POA: Insufficient documentation

## 2024-09-30 DIAGNOSIS — R002 Palpitations: Secondary | ICD-10-CM | POA: Insufficient documentation

## 2024-09-30 NOTE — Patient Instructions (Addendum)
 Medication Instructions:   NO CHANGES *If you need a refill on your cardiac medications before your next appointment, please call your pharmacy*   Lab Work: NOT NEEDED   Testing/Procedures:  NO T NEEDED  Follow-Up: At [facility], you and your health needs are our priority.  As part of our continuing mission to provide you with exceptional heart care, we have created designated Provider Care Teams.  These Care Teams include your primary Cardiologist (physician) and Advanced Practice Providers (APPs -  Physician Assistants and Nurse Practitioners) who all work together to provide you with the care you need, when you need it.     Your next appointment:   6 month(s)  The format for your next appointment:   In Person  Provider:   Maude Emmer, MD

## 2024-10-11 ENCOUNTER — Other Ambulatory Visit (HOSPITAL_COMMUNITY)

## 2024-10-13 ENCOUNTER — Encounter (HOSPITAL_COMMUNITY): Payer: Self-pay

## 2024-10-15 ENCOUNTER — Ambulatory Visit (HOSPITAL_COMMUNITY)
Admission: RE | Admit: 2024-10-15 | Discharge: 2024-10-15 | Disposition: A | Discharge status: Home / Self Care | Source: Ambulatory Visit | Attending: Cardiovascular Disease | Admitting: Cardiovascular Disease

## 2024-10-15 ENCOUNTER — Other Ambulatory Visit: Payer: Self-pay | Admitting: Cardiovascular Disease

## 2024-10-15 DIAGNOSIS — I77819 Aortic ectasia, unspecified site: Secondary | ICD-10-CM

## 2024-10-15 MED ORDER — GADOBUTROL 1 MMOL/ML IV SOLN
10.0000 mL | Freq: Once | INTRAVENOUS | Status: AC | PRN
  Administered 2024-10-15: 10 mL via INTRAVENOUS

## 2024-10-19 ENCOUNTER — Ambulatory Visit: Payer: Self-pay | Admitting: Cardiovascular Disease

## 2024-10-28 ENCOUNTER — Ambulatory Visit: Admitting: Dermatology

## 2024-11-19 DIAGNOSIS — Z419 Encounter for procedure for purposes other than remedying health state, unspecified: Secondary | ICD-10-CM | POA: Diagnosis not present

## 2024-12-13 ENCOUNTER — Encounter: Payer: Self-pay | Admitting: Dermatology

## 2024-12-13 ENCOUNTER — Ambulatory Visit (INDEPENDENT_AMBULATORY_CARE_PROVIDER_SITE_OTHER): Admitting: Dermatology

## 2024-12-13 VITALS — BP 110/70 | HR 76

## 2024-12-13 DIAGNOSIS — L700 Acne vulgaris: Secondary | ICD-10-CM

## 2024-12-13 DIAGNOSIS — L821 Other seborrheic keratosis: Secondary | ICD-10-CM | POA: Diagnosis not present

## 2024-12-13 DIAGNOSIS — L308 Other specified dermatitis: Secondary | ICD-10-CM | POA: Diagnosis not present

## 2024-12-13 DIAGNOSIS — D2272 Melanocytic nevi of left lower limb, including hip: Secondary | ICD-10-CM | POA: Diagnosis not present

## 2024-12-13 DIAGNOSIS — Z1283 Encounter for screening for malignant neoplasm of skin: Secondary | ICD-10-CM | POA: Diagnosis not present

## 2024-12-13 DIAGNOSIS — D229 Melanocytic nevi, unspecified: Secondary | ICD-10-CM

## 2024-12-13 DIAGNOSIS — L209 Atopic dermatitis, unspecified: Secondary | ICD-10-CM

## 2024-12-13 DIAGNOSIS — L814 Other melanin hyperpigmentation: Secondary | ICD-10-CM

## 2024-12-13 DIAGNOSIS — D1801 Hemangioma of skin and subcutaneous tissue: Secondary | ICD-10-CM

## 2024-12-13 NOTE — Progress Notes (Signed)
" ° °  Total Body Skin Exam (TBSE) Visit   Subjective  Cathy Vasquez is a 34 y.o. female who presents for the following: Skin Cancer Screening and Full Body Skin Exam  Patient presents today for follow up visit for TBSE. Patient's last TBSE was on 10/07/2023. Patient denies medication changes. Patient reports she does have spots, moles and lesions of concern to be evaluated. Patient reports throughout her lifetime she has had moderate sun exposure. Currently, patient reports if she has excessive sun exposure, she does apply sunscreen and/or wears protective coverings. Patient reports she does not have hx of bx. Patient denies  family history of skin cancers. The patient has spots, moles and lesions to be evaluated, some may be new or changing and the patient has concerns that these could be cancer.  Patient provided verbal consent for the use of an AI-assisted program to generate a detailed after-visit summary. The patient understands that the AI tool is used to support clinical documentation and that all information will be reviewed and verified by the healthcare provider.  Patient reports she is not actively pregnant trying to conceive or nursing.   The following portions of the chart were reviewed this encounter and updated as appropriate: medications, allergies, medical history  Review of Systems:  No other skin or systemic complaints except as noted in HPI or Assessment and Plan.  Objective  Well appearing patient in no apparent distress; mood and affect are within normal limits.  A full examination was performed including scalp, head, eyes, ears, nose, lips, neck, chest, axillae, abdomen, back, buttocks, bilateral upper extremities, bilateral lower extremities, hands, feet, fingers, toes, fingernails, and toenails. All findings within normal limits unless otherwise noted below.   Relevant physical exam findings are noted in the Assessment and Plan.   Assessment & Plan    LENTIGINES,HEMANGIOMAS - Benign normal skin lesions - Benign-appearing - Call for any changes  MELANOCYTIC NEVI (Left foot, 4th Toe) - Tan-brown and/or pink-flesh-colored symmetric macules and papules - Benign appearing on exam today - Observation - Call clinic for new or changing moles - Recommend daily use of broad spectrum spf 30+ sunscreen to sun-exposed areas.   Acne vulgaris Well controlled with current treatment regimen. Tretinoin  is effective, with occasional breakouts managed with benzoyl peroxide and pimple patches. Clindamycin  is used every morning. - Continue tretinoin  three nights a week. - Continue clindamycin  every morning. - Refilled clindamycin  prescription. - Consider simplifying routine by discontinuing clindamycin  if desired.  Hand eczema Well controlled with current treatment. Pymecrolimus is used as needed for flare-ups. - Continue Pymecrolimus as needed for hand eczema.   SKIN CANCER SCREENING PERFORMED TODAY.   Return in about 1 year (around 12/13/2025) for TBSE.  I, Cathy Vasquez, am acting as neurosurgeon for [employer], DO.  Documentation: I have reviewed the above documentation for accuracy and completeness, and I agree with the above.  Cathy Lenis, DO    "

## 2024-12-13 NOTE — Patient Instructions (Addendum)
 VISIT SUMMARY:  Today, you had a routine dermatology follow-up to monitor a mole on your toe and discuss your skincare routine. Your acne is well-controlled with your current regimen, and your hand eczema is also well-managed. The mole on your lower limb remains unchanged, and no new moles or concerning changes were noted.  YOUR PLAN:  -ACNE VULGARIS:  Acne vulgaris is a common skin condition that occurs when hair follicles become clogged with oil and dead skin cells. Your acne is well-controlled with your current treatment regimen.   Continue using tretinoin  three nights a week and clindamycin  every morning. Your clindamycin  prescription has been refilled. You may consider simplifying your routine by discontinuing clindamycin  if you wish.  -HAND ECZEMA:  Hand eczema is a condition that makes your skin red, inflamed, and itchy. Your hand eczema is well-controlled with your current treatment. Continue using Pimecrolimus  as needed for flare-ups.  -BENIGN MELANOCYTIC NEVUS OF LOWER LIMB:  A benign melanocytic nevus is a non-cancerous mole. The mole on your lower limb is unchanged since the last evaluation. Continue monitoring the nevus for any changes and take pictures for future comparison.  INSTRUCTIONS:  Please continue with your current skincare routine and monitor the mole on your lower limb for any changes. If you notice any new moles or changes in the existing one, please schedule an appointment. Your clindamycin  prescription has been refilled, and you may consider simplifying your routine by discontinuing clindamycin  if desired.    Important Information   Due to recent changes in healthcare laws, you may see results of your pathology and/or laboratory studies on MyChart before the doctors have had a chance to review them. We understand that in some cases there may be results that are confusing or concerning to you. Please understand that not all results are received at the same time and  often the doctors may need to interpret multiple results in order to provide you with the best plan of care or course of treatment. Therefore, we ask that you please give us  2 business days to thoroughly review all your results before contacting the office for clarification. Should we see a critical lab result, you will be contacted sooner.     If You Need Anything After Your Visit   If you have any questions or concerns for your doctor, please call our main line at [phone]. If no one answers, please leave a voicemail as directed and we will return your call as soon as possible. Messages left after 4 pm will be answered the following business day.    You may also send us  a message via MyChart. We typically respond to MyChart messages within 1-2 business days.  For prescription refills, please ask your pharmacy to contact our office. Our fax number is [phone].  If you have an urgent issue when the clinic is closed that cannot wait until the next business day, you can page your doctor at the number below.     Please note that while we do our best to be available for urgent issues outside of office hours, we are not available 24/7.    If you have an urgent issue and are unable to reach us , you may choose to seek medical care at your doctor's office, retail clinic, urgent care center, or emergency room.   If you have a medical emergency, please immediately call 911 or go to the emergency department. In the event of inclement weather, please call our main line at [phone] for an  update on the status of any delays or closures.  Dermatology Medication Tips: Please keep the boxes that topical medications come in in order to help keep track of the instructions about where and how to use these. Pharmacies typically print the medication instructions only on the boxes and not directly on the medication tubes.   If your medication is too expensive, please contact our office at [phone] or  send us  a message through MyChart.    We are unable to tell what your co-pay for medications will be in advance as this is different depending on your insurance coverage. However, we may be able to find a substitute medication at lower cost or fill out paperwork to get insurance to cover a needed medication.    If a prior authorization is required to get your medication covered by your insurance company, please allow us  1-2 business days to complete this process.   Drug prices often vary depending on where the prescription is filled and some pharmacies may offer cheaper prices.   The website www.goodrx.com contains coupons for medications through different pharmacies. The prices here do not account for what the cost may be with help from insurance (it may be cheaper with your insurance), but the website can give you the price if you did not use any insurance.  - You can print the associated coupon and take it with your prescription to the pharmacy.  - You may also stop by our office during regular business hours and pick up a GoodRx coupon card.  - If you need your prescription sent electronically to a different pharmacy, notify our office through [facility] or by phone at [phone]

## 2024-12-20 ENCOUNTER — Encounter: Payer: Self-pay | Admitting: Family

## 2024-12-20 ENCOUNTER — Ambulatory Visit: Admitting: Family

## 2024-12-20 VITALS — BP 94/57 | HR 78 | Temp 98.0°F | Ht 61.0 in | Wt 116.8 lb

## 2024-12-20 DIAGNOSIS — R110 Nausea: Secondary | ICD-10-CM

## 2024-12-20 DIAGNOSIS — J101 Influenza due to other identified influenza virus with other respiratory manifestations: Secondary | ICD-10-CM | POA: Diagnosis not present

## 2024-12-20 LAB — POCT INFLUENZA A/B
Influenza A, POC: POSITIVE — AB
Influenza B, POC: NEGATIVE

## 2024-12-20 LAB — POC COVID19 BINAXNOW: SARS Coronavirus 2 Ag: NEGATIVE

## 2024-12-20 MED ORDER — OSELTAMIVIR PHOSPHATE 75 MG PO CAPS: 75.0000 mg | ORAL_CAPSULE | Freq: Two times a day (BID) | ORAL | 0 refills | Status: AC

## 2024-12-20 NOTE — Telephone Encounter (Signed)
 did she eat first? best to wait 20-66minutes after eating - only other option is the Xofluza, one dose, but should eat and wait 30 min to take. Let me know.

## 2024-12-20 NOTE — Progress Notes (Signed)
 "  Patient ID: Cathy Vasquez, female    DOB: 30-May-1991, 34 y.o.   MRN: [id number]  Chief Complaint  Patient presents with   Cough    Pt c/o Cough with mucus, chills and fever of 100.4, 102.6, Present for 3 days. Has tried tylenol , mucinex DM and ice pack on forehead.   Discussed the use of AI scribe software for clinical note transcription with the patient, who gave verbal consent to proceed.  History of Present Illness Cathy Vasquez is a 34 year old female with heart disease who presents with fever and flu-like symptoms.  She reports fever since this morning, with a maximum of 102.6F, which decreased to 61F after Tylenol . She has been taking up to 3000 mg of Tylenol  per day as she is unable to take NSAIDs. She did not receive the flu shot this season. She has heart disease and one non-functioning kidney and asks if antiviral flu medications are safe for her. Current symptoms are headache and cough without significant nasal congestion or runny nose. Her throat felt abnormal on Saturday but is not sore today.  Assessment & Plan Influenza A infection Confirmed by positive test, Covid-19 testing negative. Immunocompromised status due to heart condition increases risk of complications. Tamiflu  appropriate given condition and symptom timing. Discussed potential for slower improvement due to delayed initiation.  - Prescribed Tamiflu  bid x 5d. - Advised safe to take acetaminophen  up to 4000 mg/day for fever for next few days. - Recommended taking Tamiflu  with food. - Advised husband to contact her PCP for prophylactic Tamiflu . - Reassured Tamiflu  is safe with current medications and renal function. - Advised to contact office if sx are persisting after finishing Tamiflu   Subjective:    Outpatient Medications Prior to Visit  Medication Sig Dispense Refill   b complex vitamins capsule Take 1 capsule by mouth daily.     Benzoyl Peroxide (PANOXYL WASH EX) Apply topically.     bisoprolol  (ZEBETA ) 5  MG tablet Take 0.5 tablets (2.5 mg total) by mouth as needed (palpitations). 30 tablet 2   cetirizine (ZYRTEC) 10 MG tablet Take 10 mg by mouth daily.     clindamycin  (CLEOCIN ) 300 MG capsule Take 2 capsules 30-60 minutes before your appointment as needed 10 capsule 1   clindamycin  (CLINDAGEL) 1 % gel APPLY TOPICALLY TWICE A DAY 30 g 2   ELIDEL  1 % cream Apply topically 2 (two) times daily. 60 g 5   EPINEPHrine  0.3 mg/0.3 mL IJ SOAJ injection Inject 0.3 mg into the muscle as needed for anaphylaxis. 1 each 2   fluticasone (FLONASE) 50 MCG/ACT nasal spray Place into both nostrils as needed.     Rimegepant Sulfate (NURTEC) 75 MG TBDP Take 1 tablet (75 mg total) by mouth as needed. 8 tablet 5   tiZANidine  (ZANAFLEX ) 4 MG tablet Take 0.5-1 tablets (2-4 mg total) by mouth every 8 (eight) hours as needed for muscle spasms. 30 tablet 1   triamcinolone  ointment (KENALOG ) 0.1 % 1 APP ONCE A DAY AT BEDTIME as needed APPLIED TOPICALLY 80 g 0   Vitamin D , Cholecalciferol, 25 MCG (1000 UT) CAPS Take 2 capsules by mouth daily in the afternoon.     No facility-administered medications prior to visit.   Past Medical History:  Diagnosis Date   Allergy    Tree nuts   Aortic aneurysm    Aortic stenosis    Atopic eczema 10/15/2018   Gastroesophageal reflux disease without esophagitis 12/29/2015   - discussed lifestyle changes -  prescribed ranitidine 12/29/15 Rx Zantac 150 mg # 60 BID with refills x 5 given(01/19/16)   Heart disease    Heart murmur    Migraines    Past Surgical History:  Procedure Laterality Date   CARDIAC SURGERY  05/17/2003   [facility] Procedure   CARDIAC VALVE REPLACEMENT     [facility] PROCEDURE  2004   VENOUS ABLATION Right 2020   varicose veins/venous insufficiency   WISDOM TOOTH EXTRACTION     Allergies[1]    Objective:    Physical Exam Vitals and nursing note reviewed.  Constitutional:      Appearance: Normal appearance. She is ill-appearing.     Interventions: Face mask  in place.  HENT:     Right Ear: Tympanic membrane and ear canal normal.     Left Ear: Tympanic membrane and ear canal normal.     Nose:     Right Sinus: Frontal sinus tenderness present.     Left Sinus: Frontal sinus tenderness present.     Mouth/Throat:     Mouth: Mucous membranes are moist.     Pharynx: Posterior oropharyngeal erythema present. No pharyngeal swelling, oropharyngeal exudate or uvula swelling.     Tonsils: No tonsillar exudate or tonsillar abscesses.  Cardiovascular:     Rate and Rhythm: Normal rate and regular rhythm.  Pulmonary:     Effort: Pulmonary effort is normal.     Breath sounds: Normal breath sounds.  Musculoskeletal:        General: Normal range of motion.  Lymphadenopathy:     Head:     Right side of head: No preauricular or posterior auricular adenopathy.     Left side of head: No preauricular or posterior auricular adenopathy.     Cervical: No cervical adenopathy.  Skin:    General: Skin is warm and dry.  Neurological:     Mental Status: She is alert.  Psychiatric:        Mood and Affect: Mood normal.        Behavior: Behavior normal.    BP (!) 94/57 (BP Location: Left Arm, Patient Position: Sitting, Cuff Size: Normal)   Pulse 78   Temp 98 F (36.7 C) (Temporal)   Ht 5' 1 (1.549 m)   Wt 116 lb 12.8 oz (53 kg)   LMP 11/27/2024 (Exact Date)   SpO2 98%   BMI 22.07 kg/m  Wt Readings from Last 3 Encounters:  12/20/24 116 lb 12.8 oz (53 kg)  09/30/24 118 lb 3.2 oz (53.6 kg)  05/24/24 117 lb 12.8 oz (53.4 kg)      Naresh Althaus, NP     [1]  Allergies Allergen Reactions   Other Anaphylaxis    Tree Nuts   Amoxicillin Rash   Cefaclor Hives and Rash   Penicillin G Rash   "

## 2024-12-21 MED ORDER — ONDANSETRON HCL 4 MG PO TABS: 4.0000 mg | ORAL_TABLET | Freq: Three times a day (TID) | ORAL | 0 refills | Status: AC | PRN

## 2024-12-21 NOTE — Telephone Encounter (Signed)
 The Xofluza is recommended within 48 hours of symptoms, so not useful now. Unfortunately, we just need to treat the symptoms. Remind her she can take 1,000mg  of the tylenol  up to 4 times per day. Stay hydrated, eat a BRAT diet (bananas, plain rice, plain apples or applesauce, toast/crackers) We can send zofran  if she needs it, let me know if other symptoms.

## 2025-01-11 ENCOUNTER — Encounter: Payer: Medicaid Other | Admitting: Physician Assistant

## 2025-02-23 ENCOUNTER — Ambulatory Visit: Admitting: Adult Health

## 2025-03-03 ENCOUNTER — Encounter: Admitting: Physician Assistant
